# Patient Record
Sex: Male | Born: 1940 | Race: Black or African American | Hispanic: No | Marital: Married | Smoking: Former smoker
Health system: Southern US, Community
[De-identification: ages and names within clinical notes are randomized; demographics above are authoritative.]

## PROBLEM LIST (undated history)

## (undated) DIAGNOSIS — M5412 Radiculopathy, cervical region: Secondary | ICD-10-CM

## (undated) DIAGNOSIS — I700 Atherosclerosis of aorta: Secondary | ICD-10-CM

## (undated) DIAGNOSIS — M545 Low back pain, unspecified: Secondary | ICD-10-CM

## (undated) DIAGNOSIS — J45909 Unspecified asthma, uncomplicated: Secondary | ICD-10-CM

## (undated) DIAGNOSIS — E7800 Pure hypercholesterolemia, unspecified: Secondary | ICD-10-CM

## (undated) DIAGNOSIS — I10 Essential (primary) hypertension: Secondary | ICD-10-CM

## (undated) HISTORY — PX: EYE SURGERY: SHX253

## (undated) HISTORY — DX: Radiculopathy, cervical region: M54.12

## (undated) HISTORY — DX: Low back pain, unspecified: M54.50

## (undated) HISTORY — PX: CATARACT EXTRACTION: SUR2

## (undated) HISTORY — DX: Low back pain: M54.5

---

## 2006-08-25 LAB — HM COLONOSCOPY: HM COLON: ABNORMAL — AB

## 2006-08-26 ENCOUNTER — Ambulatory Visit: Payer: Self-pay | Admitting: Gastroenterology

## 2006-10-27 ENCOUNTER — Ambulatory Visit: Payer: Self-pay | Admitting: Family Medicine

## 2009-03-22 DIAGNOSIS — N400 Enlarged prostate without lower urinary tract symptoms: Secondary | ICD-10-CM | POA: Insufficient documentation

## 2011-06-29 ENCOUNTER — Emergency Department: Payer: Self-pay | Admitting: Emergency Medicine

## 2011-06-29 LAB — URINALYSIS, COMPLETE
Bacteria: NEGATIVE
Bilirubin,UR: NEGATIVE
Blood: NEGATIVE
Glucose,UR: NEGATIVE mg/dL (ref 0–75)
Ketone: NEGATIVE
Ph: 6 (ref 4.5–8.0)
Protein: 100
Specific Gravity: 1.02 (ref 1.003–1.030)
WBC UR: NONE SEEN /HPF (ref 0–5)

## 2011-06-29 LAB — CBC
HCT: 43.2 % (ref 40.0–52.0)
MCH: 30.2 pg (ref 26.0–34.0)
MCHC: 33.1 g/dL (ref 32.0–36.0)
Platelet: 193 10*3/uL (ref 150–440)
RBC: 4.74 10*6/uL (ref 4.40–5.90)
RDW: 11.9 % (ref 11.5–14.5)

## 2011-06-29 LAB — BASIC METABOLIC PANEL
Anion Gap: 7 (ref 7–16)
BUN: 15 mg/dL (ref 7–18)
Chloride: 107 mmol/L (ref 98–107)
Co2: 28 mmol/L (ref 21–32)
Glucose: 83 mg/dL (ref 65–99)
Osmolality: 283 (ref 275–301)
Potassium: 3.7 mmol/L (ref 3.5–5.1)

## 2011-07-26 LAB — PSA: PSA: NORMAL

## 2012-03-04 ENCOUNTER — Ambulatory Visit: Payer: Self-pay | Admitting: Ophthalmology

## 2012-03-10 ENCOUNTER — Ambulatory Visit: Payer: Self-pay | Admitting: Ophthalmology

## 2012-04-01 ENCOUNTER — Emergency Department: Payer: Self-pay | Admitting: Emergency Medicine

## 2012-04-01 LAB — COMPREHENSIVE METABOLIC PANEL
Albumin: 4 g/dL (ref 3.4–5.0)
Alkaline Phosphatase: 85 U/L (ref 50–136)
Anion Gap: 8 (ref 7–16)
BUN: 21 mg/dL — ABNORMAL HIGH (ref 7–18)
Bilirubin,Total: 0.8 mg/dL (ref 0.2–1.0)
Calcium, Total: 8.9 mg/dL (ref 8.5–10.1)
Chloride: 105 mmol/L (ref 98–107)
Co2: 26 mmol/L (ref 21–32)
Creatinine: 0.89 mg/dL (ref 0.60–1.30)
EGFR (African American): >60
EGFR (Non-African Amer.): >60
Glucose: 140 mg/dL — ABNORMAL HIGH (ref 65–99)
Osmolality: 283 (ref 275–301)
Potassium: 3.3 mmol/L — ABNORMAL LOW (ref 3.5–5.1)
SGOT(AST): 27 U/L (ref 15–37)
SGPT (ALT): 23 U/L (ref 12–78)

## 2012-04-01 LAB — URINALYSIS, COMPLETE
Bacteria: NONE SEEN
Bilirubin,UR: NEGATIVE
Glucose,UR: NEGATIVE mg/dL (ref 0–75)
Leukocyte Esterase: NEGATIVE
Nitrite: NEGATIVE
Ph: 5 (ref 4.5–8.0)
Protein: 100

## 2012-04-01 LAB — CBC
HCT: 40.1 % (ref 40.0–52.0)
MCHC: 32 g/dL (ref 32.0–36.0)
Platelet: 142 10*3/uL — ABNORMAL LOW (ref 150–440)
RBC: 4.41 10*6/uL (ref 4.40–5.90)
RDW: 11.9 % (ref 11.5–14.5)

## 2012-04-20 LAB — TSH: TSH: 1.05 u[IU]/mL (ref 0.41–5.90)

## 2012-04-20 LAB — LIPID PANEL
CHOLESTEROL: 149 mg/dL (ref 0–200)
HDL: 70 mg/dL (ref 35–70)
LDL Cholesterol: 68 mg/dL
TRIGLYCERIDES: 53 mg/dL (ref 40–160)

## 2013-01-12 ENCOUNTER — Ambulatory Visit: Payer: Self-pay | Admitting: Ophthalmology

## 2013-11-15 ENCOUNTER — Ambulatory Visit: Payer: Self-pay | Admitting: Family Medicine

## 2014-04-03 ENCOUNTER — Ambulatory Visit: Payer: Self-pay | Admitting: Ophthalmology

## 2014-06-16 NOTE — Op Note (Signed)
PATIENT NAME:  Sean Phelps, Sean Phelps MR#:  [id number] DATE OF BIRTH:  18-Aug-1940  DATE OF PROCEDURE:  03/10/2012  PREOPERATIVE DIAGNOSES: 1. Vitreomacular traction.  2. Retinal edema.  3. Epiretinal membrane.   POSTOPERATIVE DIAGNOSES: 1. Vitreomacular traction.  2. Retinal edema.   3. Epiretinal membrane.  4. Retinal tear x 2.  PRIMARY SURGEON: Cline Cools, MD   PROCEDURES PERFORMED: 1. Pars plana vitrectomy of the right eye.  2. Internal limiting membrane peel of the right eye.  3. Endolaser of the right eye.  4. Gas exchange of the right eye.   ANESTHESIA: Retrobulbar block of the right eye with monitored anesthesia care.   COMPLICATIONS: None.   INDICATIONS FOR PROCEDURE: This patient presented to my office with slowly decreasing vision in the right eye. Examination revealed vitreomacular traction and associated epiretinal membrane. There was subretinal fluid and retinal edema. The risks, benefits, and alternatives of the above procedure were discussed, and the patient wished to proceed.   DETAILS OF PROCEDURE: After informed consent was obtained, the patient was brought into the operative suite at [facility]. The patient was placed in the supine position and was given a small dose of Alfenta, and a retrobulbar block was performed on the right eye by the primary surgeon without any complications. The right eye was prepped and draped in sterile manner. After a lid speculum was inserted, a 25-gauge trocar was placed inferotemporally through displaced conjunctiva 4 mm beyond the limbus in an oblique fashion. An infusion cannula was turned on and inserted through the trocar and secured in position with Steri-Strips. Two more trocars were placed in a similar fashion superotemporally and superonasally. The vitreous cutter and light pipe were introduced through the eye, and a core vitrectomy was performed. Posterior vitreous face was engaged with suction and  elevated off of the retina. Care was taken during the course of the suction and peel given the adhesion apparent from the vitreomacular traction. Peripheral vitreous was trimmed for 360 degrees. During this time, it was noted that there were two retinal tears at approximately 11:30 and 12:30. At the end of the peripheral vitrectomy, endolaser was introduced and these were walled off. Indocyanine green was then injected to the posterior pole. This was removed within 30 seconds, and an internal limiting membrane peel was performed for 360 degrees around the fovea. Epiretinal membrane was removed during the course of the peel. A scleral-depressed exam was performed for 360 degrees. The retinal tears up above were noted to have associated subretinal fluid. No other tears could be identified for 360 degrees. An air-fluid exchange was performed, and endolaser was used to complete walling off the area of subretinal areas of subretinal fluid and tears; 10% C3F8 was used as an air-gas exchange at the conclusion of the air-fluid exchange. C3F8 was chosen secondary to inability to open the SF6 gas. Once done, the trocars were removed. The wounds were noted to be airtight, and pressure in the eye was confirmed to be approximately 10 to 15 mmHg. Dexamethasone, 5 mg, was given into the inferior fornix and the lid speculum was removed. The eye was cleaned. Cosopt and TobraDex were placed on the eye, and the patch and shield was placed over the eye. The patient was taken to postanesthesia care with instructions to remain head up.   ____________________________ Ignacia Felling. Champ Mungo, MD mfa:cb D: 03/10/2012 09:32:22 ET T: 03/10/2012 11:07:36 ET JOB#: [id number]  cc: Ignacia Felling. Wilena Tyndall, MD, <Dictator> Cline Cools MD ELECTRONICALLY  SIGNED 03/17/2012 9:59

## 2014-07-20 ENCOUNTER — Encounter: Payer: Self-pay | Admitting: Emergency Medicine

## 2014-07-20 ENCOUNTER — Emergency Department
Admission: EM | Admit: 2014-07-20 | Discharge: 2014-07-20 | Disposition: A | Discharge status: Home / Self Care | Payer: Commercial Managed Care - HMO | Attending: Emergency Medicine | Admitting: Emergency Medicine

## 2014-07-20 DIAGNOSIS — Y9389 Activity, other specified: Secondary | ICD-10-CM | POA: Diagnosis not present

## 2014-07-20 DIAGNOSIS — Z041 Encounter for examination and observation following transport accident: Secondary | ICD-10-CM | POA: Insufficient documentation

## 2014-07-20 DIAGNOSIS — I10 Essential (primary) hypertension: Secondary | ICD-10-CM | POA: Diagnosis not present

## 2014-07-20 DIAGNOSIS — Y92410 Unspecified street and highway as the place of occurrence of the external cause: Secondary | ICD-10-CM | POA: Insufficient documentation

## 2014-07-20 DIAGNOSIS — E7800 Pure hypercholesterolemia, unspecified: Secondary | ICD-10-CM

## 2014-07-20 DIAGNOSIS — M79605 Pain in left leg: Secondary | ICD-10-CM

## 2014-07-20 DIAGNOSIS — Y998 Other external cause status: Secondary | ICD-10-CM | POA: Diagnosis not present

## 2014-07-20 HISTORY — DX: Pure hypercholesterolemia, unspecified: E78.00

## 2014-07-20 HISTORY — DX: Unspecified asthma, uncomplicated: J45.909

## 2014-07-20 HISTORY — DX: Essential (primary) hypertension: I10
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
## 2014-08-31 ENCOUNTER — Telehealth: Payer: Self-pay | Admitting: Family Medicine

## 2014-08-31 NOTE — Telephone Encounter (Signed)
Patient needs a referral to [facility]. Dr Elvera LennoxBrayson. Patient has an appt for Monday 09/04/2014

## 2014-09-04 NOTE — Telephone Encounter (Signed)
[employer] referral placed in acuity for Dr. Lockie Molahadwick Brasington  ICD-10: H40.IIX3 Primary Angle Glaucoma, Severe  Approved auth # [id number] Start - 09/04/2014 Expires - 03/03/2015

## 2014-09-05 ENCOUNTER — Encounter: Payer: Self-pay | Admitting: Family Medicine

## 2014-09-05 ENCOUNTER — Ambulatory Visit (INDEPENDENT_AMBULATORY_CARE_PROVIDER_SITE_OTHER): Payer: Commercial Managed Care - HMO | Admitting: Family Medicine

## 2014-09-05 VITALS — BP 120/64 | HR 68 | Temp 97.8°F | Resp 16 | Ht 61.0 in | Wt 121.6 lb

## 2014-09-05 DIAGNOSIS — F329 Major depressive disorder, single episode, unspecified: Secondary | ICD-10-CM | POA: Diagnosis not present

## 2014-09-05 DIAGNOSIS — F0390 Unspecified dementia without behavioral disturbance: Secondary | ICD-10-CM

## 2014-09-05 DIAGNOSIS — Z0000 Encounter for general adult medical examination without abnormal findings: Secondary | ICD-10-CM | POA: Diagnosis not present

## 2014-09-05 DIAGNOSIS — F32A Depression, unspecified: Secondary | ICD-10-CM

## 2014-09-05 DIAGNOSIS — F0153 Vascular dementia, unspecified severity, with mood disturbance: Secondary | ICD-10-CM | POA: Insufficient documentation

## 2014-09-05 DIAGNOSIS — F0150 Vascular dementia without behavioral disturbance: Secondary | ICD-10-CM | POA: Insufficient documentation

## 2014-09-05 LAB — POC HEMOCCULT BLD/STL (OFFICE/1-CARD/DIAGNOSTIC): OCCULT BLOOD DATE: NEGATIVE

## 2014-09-05 MED ORDER — DONEPEZIL HCL 10 MG PO TBDP: 10.0000 mg | ORAL_TABLET | Freq: Every day | ORAL | Status: DC

## 2014-09-05 MED ORDER — DONEPEZIL HCL 10 MG PO TABS
10.0000 mg | ORAL_TABLET | Freq: Every day | ORAL | Status: DC
Start: 2014-09-05 — End: 2015-04-20

## 2014-09-05 NOTE — Progress Notes (Signed)
Name: Sean Phelps   MRN: [id number]    DOB: 1941-02-14   Date:09/05/2014       Progress Note  Subjective  Chief Complaint  Chief Complaint  Patient presents with  . Annual Exam    Hypertension This is a chronic problem. The current episode started more than 1 year ago. The problem is unchanged. The problem is controlled. Pertinent negatives include no blurred vision, chest pain, headaches, neck pain, orthopnea, palpitations or shortness of breath. There are no associated agents to hypertension. Risk factors for coronary artery disease include dyslipidemia, male gender and sedentary lifestyle. Past treatments include angiotensin blockers. The current treatment provides moderate improvement.    74 year old presenting for annual H&P  Dementia.  Patient has stopped taking his Aricept. He is continued on his SSRI. His wife has noted decrease in his mentation and ability to recall. There were no side effects with the Aricept.  Hyperlipidemia  Past Medical History  Diagnosis Date  . Hypertension   . Asthma   . Elevated cholesterol 07/20/14    History  Substance Use Topics  . Smoking status: Former Games developermoker  . Smokeless tobacco: Not on file  . Alcohol Use: 0.0 oz/week    0 Standard drinks or equivalent per week     Current outpatient prescriptions:  .  aspirin 81 MG tablet, Take 81 mg by mouth daily., Disp: , Rfl:  .  citalopram (CELEXA) 20 MG tablet, , Disp: , Rfl:  .  simvastatin (ZOCOR) 40 MG tablet, , Disp: , Rfl:  .  losartan (COZAAR) 50 MG tablet, , Disp: , Rfl:   No Known Allergies   Depression screen PHQ 2/9 09/05/2014  Decreased Interest 0  Down, Depressed, Hopeless 0  PHQ - 2 Score 0    Fall Risk  09/05/2014  Falls in the past year? No   Functional Status Survey: Is the patient deaf or have difficulty hearing?: No Does the patient have difficulty seeing, even when wearing glasses/contacts?: No Does the patient have difficulty concentrating, remembering, or  making decisions?: Yes Does the patient have difficulty dressing or bathing?: No Does the patient have difficulty doing errands alone such as visiting a doctor's office or shopping?: Yes  Review of Systems  Constitutional: Negative for fever, chills and weight loss.  HENT: Negative for congestion, hearing loss, sore throat and tinnitus.   Eyes: Negative for blurred vision, double vision and redness.  Respiratory: Negative for cough, hemoptysis and shortness of breath.   Cardiovascular: Negative for chest pain, palpitations, orthopnea, claudication and leg swelling.  Gastrointestinal: Negative for heartburn, nausea, vomiting, diarrhea, constipation and blood in stool.  Genitourinary: Negative for dysuria, urgency, frequency and hematuria.  Musculoskeletal: Negative for myalgias, back pain, joint pain, falls and neck pain.  Skin: Negative for itching.  Neurological: Positive for weakness. Negative for dizziness, tingling, tremors, focal weakness, seizures, loss of consciousness and headaches.  Endo/Heme/Allergies: Does not bruise/bleed easily.  Psychiatric/Behavioral: Positive for depression and memory loss. Negative for substance abuse. The patient is not nervous/anxious and does not have insomnia.      Objective  Filed Vitals:   09/05/14 1118  BP: 120/64  Pulse: 68  Temp: 97.8 F (36.6 C)  Resp: 16  Height: 5\' 1"  (1.549 m)  Weight: 121 lb 9 oz (55.14 kg)  SpO2: 96%     Physical Exam  Constitutional: He is oriented to person, place, and time and well-developed, well-nourished, and in no distress.  HENT:  Head: Normocephalic.  Eyes: EOM are  normal. Pupils are equal, round, and reactive to light.  Neck: Normal range of motion. Neck supple. No thyromegaly present.  Cardiovascular: Normal rate, regular rhythm and normal heart sounds.   No murmur heard. Pulmonary/Chest: Effort normal and breath sounds normal. No respiratory distress. He has no wheezes.  Abdominal: Soft. Bowel  sounds are normal.  Genitourinary: Rectum normal, prostate normal and penis normal. Guaiac negative stool.  Musculoskeletal: Normal range of motion. He exhibits no edema.  Lymphadenopathy:    He has no cervical adenopathy.  Neurological: He is alert and oriented to person, place, and time. No cranial nerve deficit. Gait normal. Coordination normal.  Skin: Skin is warm and dry. No rash noted.  Psychiatric: Affect and judgment normal.      Assessment & Plan  1. Annual physical exam  - POC Hemoccult Bld/Stl (1-Cd Office Dx) - POC Hemoccult Bld/Stl (3-Cd Home Screen); Future - PSA  2. Dementia, without behavioral disturbance Worsened off his Aricept - donepezil (ARICEPT) 10 MG tablet; Take 1 tablet (10 mg total) by mouth at bedtime.  Dispense: 30 tablet; Refill: 5  3. Depression Currently under treatment - citalopram (CELEXA) 20 MG tablet;

## 2014-09-05 NOTE — Patient Instructions (Signed)
Follow-up in one month to assess his restarted Aricept

## 2014-09-06 ENCOUNTER — Ambulatory Visit: Payer: Commercial Managed Care - HMO | Admitting: Family Medicine

## 2014-09-25 ENCOUNTER — Other Ambulatory Visit: Payer: Self-pay | Admitting: Family Medicine

## 2014-09-28 ENCOUNTER — Telehealth: Payer: Self-pay | Admitting: Family Medicine

## 2014-09-28 NOTE — Telephone Encounter (Signed)
PT IS NEEDING REFERRAL DR Hedda Slade AT THE [facility] IN [place]. APPT IS AUG 9TH . IS FOR A REG EYE EXAM.

## 2014-09-29 NOTE — Telephone Encounter (Signed)
ERRENOUS °

## 2014-10-02 NOTE — Telephone Encounter (Signed)
Auth # [id number]  Ethlyn Gallery obtained for [facility]  ICD-10: A54.09W1 Dr. Sherlyn Lees Start- 10/02/2014 Expires- 03/31/15

## 2014-10-09 ENCOUNTER — Telehealth: Payer: Self-pay

## 2014-10-09 NOTE — Telephone Encounter (Signed)
Requesting a Referral for patient to see Dr. Duke Salvia today, [employer] Referral obtained Auth # [id number] ICD-10: [phone] ( vitreomacular adhesion, left eye) Start- 10/09/14 Expires - 04/07/2015 6 visits

## 2014-10-31 ENCOUNTER — Other Ambulatory Visit: Payer: Self-pay | Admitting: Family Medicine

## 2015-01-08 ENCOUNTER — Telehealth: Payer: Self-pay | Admitting: Family Medicine

## 2015-01-08 MED ORDER — LOSARTAN POTASSIUM 50 MG PO TABS: 50.0000 mg | ORAL_TABLET | Freq: Every day | ORAL | Status: DC

## 2015-01-08 NOTE — Telephone Encounter (Signed)
Script sent to [employer]

## 2015-01-08 NOTE — Telephone Encounter (Signed)
Pt needs refill on Losartan to be called into [facility], also Simvastatine.

## 2015-01-23 ENCOUNTER — Other Ambulatory Visit: Payer: Self-pay | Admitting: Family Medicine

## 2015-03-15 ENCOUNTER — Other Ambulatory Visit: Payer: Self-pay | Admitting: Family Medicine

## 2015-03-16 DIAGNOSIS — H35342 Macular cyst, hole, or pseudohole, left eye: Secondary | ICD-10-CM | POA: Diagnosis not present

## 2015-04-10 DIAGNOSIS — H401133 Primary open-angle glaucoma, bilateral, severe stage: Secondary | ICD-10-CM | POA: Diagnosis not present

## 2015-04-10 DIAGNOSIS — H31091 Other chorioretinal scars, right eye: Secondary | ICD-10-CM | POA: Diagnosis not present

## 2015-04-19 ENCOUNTER — Other Ambulatory Visit: Payer: Self-pay

## 2015-04-19 MED ORDER — LOSARTAN POTASSIUM 50 MG PO TABS
50.0000 mg | ORAL_TABLET | Freq: Every day | ORAL | Status: DC
Start: 2015-04-19 — End: 2015-07-06

## 2015-04-19 MED ORDER — SIMVASTATIN 40 MG PO TABS: 40.0000 mg | ORAL_TABLET | Freq: Every day | ORAL | Status: DC

## 2015-04-20 ENCOUNTER — Ambulatory Visit: Payer: Commercial Managed Care - HMO | Admitting: Family Medicine

## 2015-04-20 ENCOUNTER — Encounter: Payer: Self-pay | Admitting: Family Medicine

## 2015-04-20 ENCOUNTER — Other Ambulatory Visit: Payer: Self-pay

## 2015-04-20 DIAGNOSIS — F0390 Unspecified dementia without behavioral disturbance: Secondary | ICD-10-CM

## 2015-04-20 MED ORDER — CITALOPRAM HYDROBROMIDE 20 MG PO TABS: 20.0000 mg | ORAL_TABLET | Freq: Every day | ORAL | Status: DC

## 2015-04-20 MED ORDER — DONEPEZIL HCL 10 MG PO TABS: 10.0000 mg | ORAL_TABLET | Freq: Every day | ORAL | Status: DC

## 2015-05-14 DIAGNOSIS — Z961 Presence of intraocular lens: Secondary | ICD-10-CM | POA: Diagnosis not present

## 2015-05-14 DIAGNOSIS — H25012 Cortical age-related cataract, left eye: Secondary | ICD-10-CM | POA: Diagnosis not present

## 2015-05-14 DIAGNOSIS — H401133 Primary open-angle glaucoma, bilateral, severe stage: Secondary | ICD-10-CM | POA: Diagnosis not present

## 2015-05-14 DIAGNOSIS — H2512 Age-related nuclear cataract, left eye: Secondary | ICD-10-CM | POA: Diagnosis not present

## 2015-06-05 ENCOUNTER — Ambulatory Visit: Payer: Commercial Managed Care - HMO | Admitting: Family Medicine

## 2015-06-27 DIAGNOSIS — Z961 Presence of intraocular lens: Secondary | ICD-10-CM | POA: Diagnosis not present

## 2015-06-27 DIAGNOSIS — H18231 Secondary corneal edema, right eye: Secondary | ICD-10-CM | POA: Diagnosis not present

## 2015-06-27 DIAGNOSIS — H35351 Cystoid macular degeneration, right eye: Secondary | ICD-10-CM | POA: Diagnosis not present

## 2015-06-27 DIAGNOSIS — H2512 Age-related nuclear cataract, left eye: Secondary | ICD-10-CM | POA: Diagnosis not present

## 2015-06-27 DIAGNOSIS — H401133 Primary open-angle glaucoma, bilateral, severe stage: Secondary | ICD-10-CM | POA: Diagnosis not present

## 2015-07-06 ENCOUNTER — Other Ambulatory Visit: Payer: Self-pay | Admitting: Family Medicine

## 2015-07-13 ENCOUNTER — Ambulatory Visit (INDEPENDENT_AMBULATORY_CARE_PROVIDER_SITE_OTHER): Payer: Commercial Managed Care - HMO | Admitting: Family Medicine

## 2015-07-13 ENCOUNTER — Encounter: Payer: Self-pay | Admitting: Family Medicine

## 2015-07-13 VITALS — BP 126/66 | HR 60 | Temp 97.8°F | Resp 12 | Ht 61.0 in | Wt 122.0 lb

## 2015-07-13 DIAGNOSIS — F339 Major depressive disorder, recurrent, unspecified: Secondary | ICD-10-CM | POA: Insufficient documentation

## 2015-07-13 DIAGNOSIS — H269 Unspecified cataract: Secondary | ICD-10-CM | POA: Insufficient documentation

## 2015-07-13 DIAGNOSIS — Z01818 Encounter for other preprocedural examination: Secondary | ICD-10-CM

## 2015-07-13 DIAGNOSIS — F320 Major depressive disorder, single episode, mild: Secondary | ICD-10-CM | POA: Diagnosis not present

## 2015-07-13 DIAGNOSIS — H548 Legal blindness, as defined in USA: Secondary | ICD-10-CM | POA: Insufficient documentation

## 2015-07-13 DIAGNOSIS — I10 Essential (primary) hypertension: Secondary | ICD-10-CM | POA: Diagnosis not present

## 2015-07-13 DIAGNOSIS — H409 Unspecified glaucoma: Secondary | ICD-10-CM | POA: Diagnosis not present

## 2015-07-13 DIAGNOSIS — E785 Hyperlipidemia, unspecified: Secondary | ICD-10-CM | POA: Diagnosis not present

## 2015-07-13 DIAGNOSIS — Z862 Personal history of diseases of the blood and blood-forming organs and certain disorders involving the immune mechanism: Secondary | ICD-10-CM

## 2015-07-13 DIAGNOSIS — R9089 Other abnormal findings on diagnostic imaging of central nervous system: Secondary | ICD-10-CM | POA: Insufficient documentation

## 2015-07-13 DIAGNOSIS — Z23 Encounter for immunization: Secondary | ICD-10-CM | POA: Diagnosis not present

## 2015-07-13 DIAGNOSIS — F0390 Unspecified dementia without behavioral disturbance: Secondary | ICD-10-CM

## 2015-07-13 DIAGNOSIS — F332 Major depressive disorder, recurrent severe without psychotic features: Secondary | ICD-10-CM | POA: Insufficient documentation

## 2015-07-13 DIAGNOSIS — D638 Anemia in other chronic diseases classified elsewhere: Secondary | ICD-10-CM | POA: Insufficient documentation

## 2015-07-13 DIAGNOSIS — R69 Illness, unspecified: Secondary | ICD-10-CM | POA: Diagnosis not present

## 2015-07-13 MED ORDER — LOSARTAN POTASSIUM 50 MG PO TABS: ORAL_TABLET | ORAL | Status: DC

## 2015-07-13 MED ORDER — SIMVASTATIN 40 MG PO TABS
ORAL_TABLET | ORAL | Status: DC
Start: 2015-07-13 — End: 2015-12-26

## 2015-07-13 MED ORDER — CITALOPRAM HYDROBROMIDE 20 MG PO TABS: ORAL_TABLET | ORAL | Status: DC

## 2015-07-13 MED ORDER — DONEPEZIL HCL 10 MG PO TABS: 10.0000 mg | ORAL_TABLET | Freq: Every day | ORAL | Status: DC

## 2015-07-13 NOTE — Progress Notes (Signed)
Name: Sean Phelps   MRN: [id number]    DOB: 1940-02-26   Date:07/13/2015       Progress Note  Subjective  Chief Complaint  Chief Complaint  Patient presents with  . Medical Clearance    patient is [place]  08/09/15.     HPI  HTN: he is taking medication daily for bp, and denies side effects, no chest pain or palpitation.   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 They did not send a request for pre-op, we contact the office and they said they do not have forms to be filled out prior to surgery. Per wife he is having glaucoma repair on the left eye. He denies decrease in exercise tolerance, no previous history of heart attack or stroke , he takes aspirin daily and may stop using aspirin prior to procedure if required by surgeon. Continue other medication up to the morning of surgery  Dementia: he is forgetful, misplaced things inside the house, no hallucinations or behavior changes. He has been on Aricept since 2015. Brain MRI showed microvascular disease   Patient Active Problem List   Diagnosis Date Noted  . 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 Hypertension, benign 07/13/2015  . Hyperlipidemia 07/13/2015  . Cataract 07/13/2015  . Glaucoma 07/13/2015  . Abnormal brain MRI 07/13/2015  . Dementia 09/05/2014  . Benign enlargement of prostate 03/22/2009    Past Surgical History  Procedure Laterality Date  . Cataract extraction Right   . Eye surgery      Family History  Problem Relation Age of Onset  . Leukemia Father   . Emphysema Father   . Alcohol abuse Mother     Social History   Social History  . Marital Status: Married    Spouse Name: N/A  . Number of Children: N/A  . Years of Education: N/A   Occupational History  . Not on file.   Social History Main Topics  . Smoking status: Former  Games developermoker  . Smokeless tobacco: Not on file  . Alcohol Use: 0.0 oz/week    0 Standard drinks or equivalent per week  . Drug Use: No  . Sexual Activity: No   Other Topics Concern  . Not on file   Social History Narrative     Current outpatient prescriptions:  .  brimonidine (ALPHAGAN) 0.2 % ophthalmic solution, Apply to eye., Disp: , Rfl:  .  dorzolamide (TRUSOPT) 2 % ophthalmic solution, , Disp: , Rfl:  .  latanoprost (XALATAN) 0.005 % ophthalmic solution, Apply to eye., Disp: , Rfl:  .  ofloxacin (OCUFLOX) 0.3 % ophthalmic solution, , Disp: , Rfl:  .  timolol (TIMOPTIC) 0.5 % ophthalmic solution, , Disp: , Rfl:  .  aspirin 81 MG tablet, Take 81 mg by mouth daily., Disp: , Rfl:  .  citalopram (CELEXA) 20 MG tablet, TAKE 1 TABLET(20 MG) BY MOUTH DAILY, Disp: 30 tablet, Rfl: 5 .  donepezil (ARICEPT) 10 MG tablet, Take 1 tablet (10 mg total) by mouth at bedtime., Disp: 30 tablet, Rfl: 5 .  losartan (COZAAR) 50 MG tablet, TAKE 1 TABLET(50 MG) BY MOUTH DAILY, Disp: 30 tablet, Rfl: 5 .  simvastatin (ZOCOR) 40 MG tablet, TAKE 1 TABLET(40 MG) BY MOUTH AT BEDTIME, Disp: 30 tablet, Rfl: 5  Allergies  Allergen Reactions  . Brinzolamide-Brimonidine Other (See  Comments)    Red and itchy     ROS  Constitutional: Negative for fever or weight change.  Respiratory: Negative for cough and shortness of breath.   Cardiovascular: Negative for chest pain or palpitations.  Gastrointestinal: Negative for abdominal pain, no bowel changes.  Musculoskeletal: Negative for gait problem or joint swelling.  Skin: Negative for rash.  Neurological: Negative for dizziness or headache.  No other specific complaints in a complete review of systems (except as listed in HPI above).  Objective  Filed Vitals:   07/13/15 1042  BP: 126/66  Pulse: 60  Temp: 97.8 F (36.6 C)  TempSrc: Oral  Resp: 12  Height:  (1.549 m)  Weight: 122 lb (55.339 kg)  SpO2: 97%    Body mass index is 23.06  kg/(m^2).  Physical Exam  Constitutional: Patient appears well-developed and well-nourished. No distress.  HEENT: head atraumatic, normocephalic, conjunctiva injected and left eye is draining clear fluid, neck supple, throat within normal limits Cardiovascular: Normal rate, regular rhythm and normal heart sounds.  No murmur heard. No BLE edema. Pulmonary/Chest: Effort normal and breath sounds normal. No respiratory distress. Abdominal: Soft.  There is no tenderness. Psychiatric: Patient has a normal mood and affect, cooperative, oriented in time and place, also knows the president of [place]  PHQ2/9: Depression screen [facility] 2/9 07/13/2015 09/05/2014  Decreased Interest 0 0  Down, Depressed, Hopeless 0 0  PHQ - 2 Score 0 0    Fall Risk: Fall Risk  07/13/2015 09/05/2014  Falls in the past year? No No    Functional Status Survey: Is the patient deaf or have difficulty hearing?: No Does the patient have difficulty seeing, even when wearing glasses/contacts?: Yes Does the patient have difficulty concentrating, remembering, or making decisions?: No Does the patient have difficulty walking or climbing stairs?: No Does the patient have difficulty dressing or bathing?: No Does the patient have difficulty doing errands alone such as visiting a doctor's office or shopping?: No   Assessment & Plan  1. Hypertension, benign  - Comprehensive metabolic panel - losartan (COZAAR) 50 MG tablet; TAKE 1 TABLET(50 MG) BY MOUTH DAILY  Dispense: 30 tablet; Refill: 5  2. Mild major depression (HCC)  - citalopram (CELEXA) 20 MG tablet; TAKE 1 TABLET(20 MG) BY MOUTH DAILY  Dispense: 30 tablet; Refill: 5  3. Hyperlipidemia  - Lipid panel - simvastatin (ZOCOR) 40 MG tablet; TAKE 1 TABLET(40 MG) BY MOUTH AT BEDTIME  Dispense: 30 tablet; Refill: 5  4. Glaucoma   5. Cataract   6. Dementia, without behavioral disturbance  - donepezil (ARICEPT) 10 MG tablet; Take 1 tablet (10 mg total) by mouth at  bedtime.  Dispense: 30 tablet; Refill: 5  7. History of anemia  - CBC with Differential/Platelet  8. Need for Tdap vaccination  - Tdap vaccine greater than or equal to 7yo IM  9. Need for pneumococcal vaccination  - Pneumococcal polysaccharide vaccine 23-valent greater than or equal to 2yo subcutaneous/IM  10. Need for shingles vaccine  - Varicella-zoster vaccine subcutaneous -refused  11. Pre-op exam  -Tdap Able to perform at least 4 METS of activity, no cardiac symptoms, low risk procedure, may have surgery without further testing

## 2015-07-20 DIAGNOSIS — I10 Essential (primary) hypertension: Secondary | ICD-10-CM | POA: Diagnosis not present

## 2015-07-20 DIAGNOSIS — D649 Anemia, unspecified: Secondary | ICD-10-CM | POA: Diagnosis not present

## 2015-07-20 DIAGNOSIS — R739 Hyperglycemia, unspecified: Secondary | ICD-10-CM | POA: Diagnosis not present

## 2015-07-20 DIAGNOSIS — Z862 Personal history of diseases of the blood and blood-forming organs and certain disorders involving the immune mechanism: Secondary | ICD-10-CM | POA: Diagnosis not present

## 2015-07-20 DIAGNOSIS — E785 Hyperlipidemia, unspecified: Secondary | ICD-10-CM | POA: Diagnosis not present

## 2015-07-21 LAB — CBC WITH DIFFERENTIAL/PLATELET
BASOS: 1 %
Basophils Absolute: 0 10*3/uL (ref 0.0–0.2)
EOS (ABSOLUTE): 0.1 10*3/uL (ref 0.0–0.4)
EOS: 1 %
HEMATOCRIT: 36.8 % — AB (ref 37.5–51.0)
HEMOGLOBIN: 11.9 g/dL — AB (ref 12.6–17.7)
IMMATURE GRANS (ABS): 0 10*3/uL (ref 0.0–0.1)
Immature Granulocytes: 0 %
LYMPHS: 38 %
Lymphocytes Absolute: 1.6 10*3/uL (ref 0.7–3.1)
MCH: 29.9 pg (ref 26.6–33.0)
MCHC: 32.3 g/dL (ref 31.5–35.7)
MCV: 93 fL (ref 79–97)
Monocytes Absolute: 0.4 10*3/uL (ref 0.1–0.9)
Monocytes: 9 %
NEUTROS ABS: 2.1 10*3/uL (ref 1.4–7.0)
Neutrophils: 51 %
PLATELETS: 172 10*3/uL (ref 150–379)
RBC: 3.98 x10E6/uL — ABNORMAL LOW (ref 4.14–5.80)
RDW: 11.9 % — ABNORMAL LOW (ref 12.3–15.4)
WBC: 4.1 10*3/uL (ref 3.4–10.8)

## 2015-07-21 LAB — LIPID PANEL
CHOL/HDL RATIO: 1.8 ratio (ref 0.0–5.0)
CHOLESTEROL TOTAL: 151 mg/dL (ref 100–199)
HDL: 83 mg/dL (ref >39)
LDL Calculated: 60 mg/dL (ref 0–99)
TRIGLYCERIDES: 41 mg/dL (ref 0–149)
VLDL Cholesterol Cal: 8 mg/dL (ref 5–40)

## 2015-07-21 LAB — COMPREHENSIVE METABOLIC PANEL
A/G RATIO: 1.4 (ref 1.2–2.2)
ALT: 10 IU/L (ref 0–44)
AST: 21 IU/L (ref 0–40)
Albumin: 3.8 g/dL (ref 3.5–4.8)
Alkaline Phosphatase: 68 IU/L (ref 39–117)
BUN/Creatinine Ratio: 22 (ref 10–24)
BUN: 18 mg/dL (ref 8–27)
Bilirubin Total: 0.5 mg/dL (ref 0.0–1.2)
CALCIUM: 9.2 mg/dL (ref 8.6–10.2)
CO2: 23 mmol/L (ref 18–29)
Chloride: 105 mmol/L (ref 96–106)
Creatinine, Ser: 0.82 mg/dL (ref 0.76–1.27)
GFR calc Af Amer: 99 mL/min/{1.73_m2} (ref >59)
GFR calc non Af Amer: 85 mL/min/{1.73_m2} (ref >59)
GLOBULIN, TOTAL: 2.7 g/dL (ref 1.5–4.5)
Glucose: 107 mg/dL — ABNORMAL HIGH (ref 65–99)
POTASSIUM: 3.9 mmol/L (ref 3.5–5.2)
Sodium: 144 mmol/L (ref 134–144)
TOTAL PROTEIN: 6.5 g/dL (ref 6.0–8.5)

## 2015-07-23 ENCOUNTER — Other Ambulatory Visit: Payer: Self-pay | Admitting: Family Medicine

## 2015-07-23 DIAGNOSIS — R739 Hyperglycemia, unspecified: Secondary | ICD-10-CM

## 2015-07-23 DIAGNOSIS — D649 Anemia, unspecified: Secondary | ICD-10-CM

## 2015-07-25 LAB — FERRITIN: FERRITIN: 209 ng/mL (ref 30–400)

## 2015-07-25 LAB — SPECIMEN STATUS REPORT

## 2015-07-25 LAB — HEMOGLOBIN A1C
ESTIMATED AVERAGE GLUCOSE: 103 mg/dL
Hgb A1c MFr Bld: 5.2 % (ref 4.8–5.6)

## 2015-07-26 ENCOUNTER — Encounter: Payer: Self-pay | Admitting: Family Medicine

## 2015-08-02 ENCOUNTER — Telehealth: Payer: Self-pay | Admitting: Family Medicine

## 2015-08-02 NOTE — Telephone Encounter (Signed)
Please return wife call. Have questions about the patient summary that was given to him on the last visit

## 2015-08-06 NOTE — Telephone Encounter (Signed)
Patient wife had a question why his summary had history of anemia, I informed patient wife his blood work showed he had mild anemia.

## 2015-08-09 DIAGNOSIS — I10 Essential (primary) hypertension: Secondary | ICD-10-CM | POA: Diagnosis not present

## 2015-08-09 DIAGNOSIS — H4010X3 Unspecified open-angle glaucoma, severe stage: Secondary | ICD-10-CM | POA: Diagnosis not present

## 2015-08-09 DIAGNOSIS — Z87891 Personal history of nicotine dependence: Secondary | ICD-10-CM | POA: Diagnosis not present

## 2015-08-09 DIAGNOSIS — R69 Illness, unspecified: Secondary | ICD-10-CM | POA: Diagnosis not present

## 2015-08-09 DIAGNOSIS — E785 Hyperlipidemia, unspecified: Secondary | ICD-10-CM | POA: Diagnosis not present

## 2015-08-09 DIAGNOSIS — H401123 Primary open-angle glaucoma, left eye, severe stage: Secondary | ICD-10-CM | POA: Diagnosis not present

## 2015-08-09 DIAGNOSIS — Z79899 Other long term (current) drug therapy: Secondary | ICD-10-CM | POA: Diagnosis not present

## 2015-08-10 DIAGNOSIS — H401113 Primary open-angle glaucoma, right eye, severe stage: Secondary | ICD-10-CM | POA: Insufficient documentation

## 2015-08-10 DIAGNOSIS — Z9889 Other specified postprocedural states: Secondary | ICD-10-CM | POA: Insufficient documentation

## 2015-10-08 DIAGNOSIS — Z9889 Other specified postprocedural states: Secondary | ICD-10-CM | POA: Diagnosis not present

## 2015-10-08 DIAGNOSIS — H401133 Primary open-angle glaucoma, bilateral, severe stage: Secondary | ICD-10-CM | POA: Diagnosis not present

## 2015-12-26 ENCOUNTER — Ambulatory Visit (INDEPENDENT_AMBULATORY_CARE_PROVIDER_SITE_OTHER): Payer: Medicare HMO | Admitting: Family Medicine

## 2015-12-26 ENCOUNTER — Encounter: Payer: Self-pay | Admitting: Family Medicine

## 2015-12-26 VITALS — BP 134/86 | HR 55 | Temp 97.9°F | Resp 16 | Ht 61.0 in | Wt 121.6 lb

## 2015-12-26 DIAGNOSIS — Z23 Encounter for immunization: Secondary | ICD-10-CM

## 2015-12-26 DIAGNOSIS — F329 Major depressive disorder, single episode, unspecified: Secondary | ICD-10-CM | POA: Diagnosis not present

## 2015-12-26 DIAGNOSIS — I10 Essential (primary) hypertension: Secondary | ICD-10-CM

## 2015-12-26 DIAGNOSIS — H548 Legal blindness, as defined in USA: Secondary | ICD-10-CM

## 2015-12-26 DIAGNOSIS — E7800 Pure hypercholesterolemia, unspecified: Secondary | ICD-10-CM

## 2015-12-26 DIAGNOSIS — F0153 Vascular dementia, unspecified severity, with mood disturbance: Secondary | ICD-10-CM

## 2015-12-26 DIAGNOSIS — D638 Anemia in other chronic diseases classified elsewhere: Secondary | ICD-10-CM | POA: Diagnosis not present

## 2015-12-26 DIAGNOSIS — F332 Major depressive disorder, recurrent severe without psychotic features: Secondary | ICD-10-CM

## 2015-12-26 DIAGNOSIS — F0150 Vascular dementia without behavioral disturbance: Secondary | ICD-10-CM

## 2015-12-26 DIAGNOSIS — R69 Illness, unspecified: Secondary | ICD-10-CM | POA: Diagnosis not present

## 2015-12-26 MED ORDER — SIMVASTATIN 40 MG PO TABS: ORAL_TABLET | ORAL | 5 refills | Status: DC

## 2015-12-26 MED ORDER — DONEPEZIL HCL 10 MG PO TABS: 10.0000 mg | ORAL_TABLET | Freq: Every day | ORAL | 5 refills | Status: DC

## 2015-12-26 MED ORDER — LOSARTAN POTASSIUM 50 MG PO TABS: ORAL_TABLET | ORAL | 5 refills | Status: DC

## 2015-12-26 MED ORDER — CITALOPRAM HYDROBROMIDE 20 MG PO TABS: ORAL_TABLET | ORAL | 5 refills | Status: DC

## 2015-12-26 NOTE — Progress Notes (Signed)
Name: Sean PaceCharles H Bohman   MRN: [id number]    DOB: 1940-04-19   Date:12/26/2015       Progress Note  Subjective  Chief Complaint  Chief Complaint  Patient presents with  . Medication Refill    6 month F/U  . Memory Loss    Patient wife states his memory is getting worst even with medication. Forgetting short term things and asking the same question over repeatedly.   . Hypertension    Patient states he has headaches but might be related to stress  . Hyperlipidemia  . Depression    Unchanged.     HPI    HTN: he is taking medication daily for bp, and denies side effects, no chest pain or palpitation. BP is at goal .  Hyperlipidemia: taking simvastatin, denies myalgia, reviewed labs, and it is at goal   Dementia: he is forgetful, misplaced things inside the house, no hallucinations or behavior changes. He has been on Aricept since 2015. Brain MRI showed microvascular disease.   Depression: he has a long history of depression, but worse since this Summer, legally blind of right eye and vision has decreased,  He has been unable to work since August 2017. Gets bored at home. Not getting out of the house. He has suicidal thoughts, he denies suicidal planning. He states he would not due that because of his children.   Anemia of Chronic diease: we will monitor for now, normal ferritin   Patient Active Problem List   Diagnosis Date Noted  . Primary open angle glaucoma of both eyes, severe stage 08/10/2015  . Status post eye surgery 08/10/2015  . 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 Hypertension, benign 07/13/2015  . Hyperlipidemia 07/13/2015  . Cataract 07/13/2015  . Abnormal brain MRI 07/13/2015  . Legally blind in right eye, as defined in [place] 07/13/2015  . Anemia of chronic disease 07/13/2015  . Dementia, vascular, with depression 09/05/2014  . Benign enlargement of prostate 03/22/2009    Past Surgical History:  Procedure Laterality Date  . CATARACT EXTRACTION Right   .  EYE SURGERY      Family History  Problem Relation Age of Onset  . Leukemia Father   . Emphysema Father   . Alcohol abuse Mother     Social History   Social History  . Marital status: Married    Spouse name: N/A  . Number of children: N/A  . Years of education: N/A   Occupational History  . Not on file.   Social History Main Topics  . Smoking status: Former Smoker    Years: 25.00    Types: Cigarettes    Quit date: 12/25/1965  . Smokeless tobacco: Never Used  . Alcohol use No  . Drug use: No  . Sexual activity: No   Other Topics Concern  . Not on file   Social History Narrative  . No narrative on file     Current Outpatient Prescriptions:  .  aspirin 81 MG tablet, Take 81 mg by mouth daily., Disp: , Rfl:  .  citalopram (CELEXA) 20 MG tablet, TAKE 1 TABLET(20 MG) BY MOUTH DAILY, Disp: 30 tablet, Rfl: 5 .  donepezil (ARICEPT) 10 MG tablet, Take 1 tablet (10 mg total) by mouth at bedtime., Disp: 30 tablet, Rfl: 5 .  losartan (COZAAR) 50 MG tablet, TAKE 1 TABLET(50 MG) BY MOUTH DAILY, Disp: 30 tablet, Rfl: 5 .  simvastatin (ZOCOR) 40 MG tablet, TAKE 1 TABLET(40 MG) BY MOUTH AT BEDTIME, Disp:  30 tablet, Rfl: 5 .  timolol (TIMOPTIC) 0.5 % ophthalmic solution, , Disp: , Rfl:   Allergies  Allergen Reactions  . Brinzolamide-Brimonidine Other (See Comments)    Red and itchy     ROS  Constitutional: Negative for fever or weight change.  Respiratory: Negative for cough and shortness of breath.   Cardiovascular: Negative for chest pain or palpitations.  Gastrointestinal: Negative for abdominal pain, no bowel changes.  Musculoskeletal: Negative for gait problem or joint swelling.  Skin: Negative for rash.  Neurological: Negative for dizziness or headache.  No other specific complaints in a complete review of systems (except as listed in HPI above).  Objective  Vitals:   12/26/15 1114  BP: 134/86  Pulse: (!) 55  Resp: 16  Temp: 97.9 F (36.6 C)  TempSrc: Oral   SpO2: 97%  Weight: 121 lb 9.6 oz (55.2 kg)  Height: 5\' 1"  (1.549 m)    Body mass index is 22.98 kg/m.  Physical Exam  Constitutional: Patient appears well-developed and well-nourished.  No distress.  HEENT: head atraumatic, normocephalic, pupils equal and reactive to light,  neck supple, throat within normal limits Cardiovascular: Normal rate, regular rhythm and normal heart sounds.  No murmur heard. No BLE edema. Pulmonary/Chest: Effort normal and breath sounds normal. No respiratory distress. Abdominal: Soft.  There is no tenderness. Psychiatric: Patient has a depressed  mood and affect. behavior is normal. Judgment and thought content normal.  PHQ2/9: Depression screen [facility] 2/9 12/26/2015 12/26/2015 07/13/2015 09/05/2014  Decreased Interest 1 0 0 0  Down, Depressed, Hopeless 2 0 0 0  PHQ - 2 Score 3 0 0 0  Altered sleeping 3 - - -  Tired, decreased energy 0 - - -  Change in appetite 0 - - -  Feeling bad or failure about yourself  3 - - -  Trouble concentrating 3 - - -  Moving slowly or fidgety/restless 2 - - -  Suicidal thoughts 2 - - -  PHQ-9 Score 16 - - -  Difficult doing work/chores Extremely dIfficult - - -    Fall Risk: Fall Risk  12/26/2015 07/13/2015 09/05/2014  Falls in the past year? Yes No No  Number falls in past yr: 2 or more - -  Injury with Fall? No - -  Risk for fall due to : Impaired vision - -  Follow up Falls prevention discussed - -     Functional Status Survey: Is the patient deaf or have difficulty hearing?: No Does the patient have difficulty seeing, even when wearing glasses/contacts?: No Does the patient have difficulty concentrating, remembering, or making decisions?: Yes Does the patient have difficulty walking or climbing stairs?: No Does the patient have difficulty dressing or bathing?: No Does the patient have difficulty doing errands alone such as visiting a doctor's office or shopping?: Yes    Assessment & Plan  1. Hypertension,  benign  - losartan (COZAAR) 50 MG tablet; TAKE 1 TABLET(50 MG) BY MOUTH DAILY  Dispense: 30 tablet; Refill: 5  2. Severe episode of recurrent major depressive disorder, without psychotic features (HCC)  - citalopram (CELEXA) 20 MG tablet; TAKE 1 TABLET(20 MG) BY MOUTH DAILY  Dispense: 30 tablet; Refill: 5 - Ambulatory referral to Psychology  3. Pure hypercholesterolemia  - simvastatin (ZOCOR) 40 MG tablet; TAKE 1 TABLET(40 MG) BY MOUTH AT BEDTIME  Dispense: 30 tablet; Refill: 5  4. Legally blind in right eye, as defined in [place]  Continue follow up ophthalmologist   5. Anemia  of chronic disease  Recheck next visit  6. Dementia, vascular, with depression  - donepezil (ARICEPT) 10 MG tablet; Take 1 tablet (10 mg total) by mouth at bedtime.  Dispense: 30 tablet; Refill: 5

## 2015-12-26 NOTE — Addendum Note (Signed)
Addended by: Cynda FamiliaJOHNSON, Jadriel Saxer L on: 12/26/2015 11:58 AM   Modules accepted: Orders

## 2016-01-03 ENCOUNTER — Other Ambulatory Visit: Payer: Self-pay

## 2016-01-03 DIAGNOSIS — F332 Major depressive disorder, recurrent severe without psychotic features: Secondary | ICD-10-CM

## 2016-01-03 NOTE — Telephone Encounter (Signed)
Patient requesting 90 day refill of Citalopram due to cost.

## 2016-01-05 MED ORDER — CITALOPRAM HYDROBROMIDE 20 MG PO TABS: ORAL_TABLET | ORAL | 0 refills | Status: DC

## 2016-01-09 DIAGNOSIS — H401133 Primary open-angle glaucoma, bilateral, severe stage: Secondary | ICD-10-CM | POA: Diagnosis not present

## 2016-02-11 ENCOUNTER — Ambulatory Visit: Payer: Medicare HMO | Admitting: Family Medicine

## 2016-02-11 DIAGNOSIS — H401133 Primary open-angle glaucoma, bilateral, severe stage: Secondary | ICD-10-CM | POA: Diagnosis not present

## 2016-02-11 DIAGNOSIS — H2512 Age-related nuclear cataract, left eye: Secondary | ICD-10-CM | POA: Diagnosis not present

## 2016-02-11 DIAGNOSIS — H35353 Cystoid macular degeneration, bilateral: Secondary | ICD-10-CM | POA: Diagnosis not present

## 2016-02-11 DIAGNOSIS — H21542 Posterior synechiae (iris), left eye: Secondary | ICD-10-CM | POA: Diagnosis not present

## 2016-03-05 DIAGNOSIS — H3581 Retinal edema: Secondary | ICD-10-CM | POA: Diagnosis not present

## 2016-03-05 DIAGNOSIS — H35371 Puckering of macula, right eye: Secondary | ICD-10-CM | POA: Diagnosis not present

## 2016-03-05 DIAGNOSIS — H35342 Macular cyst, hole, or pseudohole, left eye: Secondary | ICD-10-CM | POA: Diagnosis not present

## 2016-03-05 DIAGNOSIS — H21542 Posterior synechiae (iris), left eye: Secondary | ICD-10-CM | POA: Diagnosis not present

## 2016-03-07 ENCOUNTER — Ambulatory Visit: Payer: Medicare HMO | Admitting: Family Medicine

## 2016-03-11 ENCOUNTER — Ambulatory Visit: Payer: Medicare HMO | Admitting: Family Medicine

## 2016-03-19 ENCOUNTER — Encounter: Payer: Self-pay | Admitting: Family Medicine

## 2016-03-19 ENCOUNTER — Ambulatory Visit (INDEPENDENT_AMBULATORY_CARE_PROVIDER_SITE_OTHER): Payer: Medicare HMO | Admitting: Family Medicine

## 2016-03-19 VITALS — BP 124/70 | HR 60 | Temp 97.7°F | Resp 16 | Ht 61.0 in | Wt 121.3 lb

## 2016-03-19 DIAGNOSIS — H2512 Age-related nuclear cataract, left eye: Secondary | ICD-10-CM

## 2016-03-19 DIAGNOSIS — H548 Legal blindness, as defined in USA: Secondary | ICD-10-CM | POA: Diagnosis not present

## 2016-03-19 DIAGNOSIS — Z01818 Encounter for other preprocedural examination: Secondary | ICD-10-CM

## 2016-03-19 DIAGNOSIS — F332 Major depressive disorder, recurrent severe without psychotic features: Secondary | ICD-10-CM | POA: Diagnosis not present

## 2016-03-19 DIAGNOSIS — I10 Essential (primary) hypertension: Secondary | ICD-10-CM | POA: Diagnosis not present

## 2016-03-19 DIAGNOSIS — H35353 Cystoid macular degeneration, bilateral: Secondary | ICD-10-CM

## 2016-03-19 DIAGNOSIS — R69 Illness, unspecified: Secondary | ICD-10-CM | POA: Diagnosis not present

## 2016-03-19 DIAGNOSIS — D649 Anemia, unspecified: Secondary | ICD-10-CM

## 2016-03-19 LAB — CBC WITH DIFFERENTIAL/PLATELET
BASOS ABS: 0 {cells}/uL (ref 0–200)
Basophils Relative: 0 %
EOS PCT: 2 %
Eosinophils Absolute: 98 cells/uL (ref 15–500)
HEMATOCRIT: 41.1 % (ref 38.5–50.0)
HEMOGLOBIN: 13.2 g/dL (ref 13.2–17.1)
LYMPHS ABS: 1813 {cells}/uL (ref 850–3900)
LYMPHS PCT: 37 %
MCH: 30 pg (ref 27.0–33.0)
MCHC: 32.1 g/dL (ref 32.0–36.0)
MCV: 93.4 fL (ref 80.0–100.0)
MPV: 10.3 fL (ref 7.5–12.5)
Monocytes Absolute: 441 cells/uL (ref 200–950)
Monocytes Relative: 9 %
Neutro Abs: 2548 cells/uL (ref 1500–7800)
Neutrophils Relative %: 52 %
Platelets: 166 10*3/uL (ref 140–400)
RBC: 4.4 MIL/uL (ref 4.20–5.80)
RDW: 11.8 % (ref 11.0–15.0)
WBC: 4.9 10*3/uL (ref 3.8–10.8)

## 2016-03-19 MED ORDER — CITALOPRAM HYDROBROMIDE 20 MG PO TABS: ORAL_TABLET | ORAL | 1 refills | Status: DC

## 2016-03-19 NOTE — Progress Notes (Signed)
Name: Sean PaceCharles H Lavin   MRN: [id number]    DOB: 1940-10-07   Date:03/19/2016       Progress Note  Subjective  Chief Complaint  Chief Complaint  Patient presents with  . surgical clearance    [place] [facility] [place] [place] Dr. Loraine Grip[place]oya    HPI  Pre-op; he will have cataract surgery and Dr. Loraine GripMoya has requested pre-op clearance. He denies chest pain or decrease in exercise tolerance. Able to go up a flight of stairs without SOB. Had eye surgery last year without any complications. He denies tobacco use, no false teeth, he has chronic mild anemia, he is compliant with bp medication and bp has been at goal  Depression: PHQ9 is slightly better, he is taking Citalopram and denies side effects, discussed counseling but he wants to hold off , he states he has been coping better. Accepting the fact that he cannot work    Patient Active Problem List   Diagnosis Date Noted  . Primary open angle glaucoma of both eyes, severe stage 08/10/2015  . Status post eye surgery 08/10/2015  . Recurrent major depression-severe (HCC) 07/13/2015  . Hypertension, benign 07/13/2015  . Hyperlipidemia 07/13/2015  . Cataract 07/13/2015  . Abnormal brain MRI 07/13/2015  . Legally blind in right eye, as defined in [place] 07/13/2015  . Anemia of chronic disease 07/13/2015  . Dementia, vascular, with depression 09/05/2014  . Benign enlargement of prostate 03/22/2009    Past Surgical History:  Procedure Laterality Date  . CATARACT EXTRACTION Right   . EYE SURGERY      Family History  Problem Relation Age of Onset  . Leukemia Father   . Emphysema Father   . Alcohol abuse Mother     Social History   Social History  . Marital status: Married    Spouse name: N/A  . Number of children: N/A  . Years of education: N/A   Occupational History  . Not on file.   Social History Main Topics  . Smoking status: Former Smoker    Years: 25.00    Types: Cigarettes    Quit date: 12/25/1965  . Smokeless  tobacco: Never Used  . Alcohol use No  . Drug use: No  . Sexual activity: No   Other Topics Concern  . Not on file   Social History Narrative  . No narrative on file     Current Outpatient Prescriptions:  .  ketorolac (ACULAR) 0.4 % SOLN, Insert one drop 4 times a day left eye, Disp: , Rfl:  .  ofloxacin (OCUFLOX) 0.3 % ophthalmic solution, Insert one drop 3 times a day into surgical eye starting 4 days prior to surgery, Disp: , Rfl:  .  prednisoLONE acetate (PRED FORTE) 1 % ophthalmic suspension, Apply to eye., Disp: , Rfl:  .  aspirin 81 MG tablet, Take 81 mg by mouth daily., Disp: , Rfl:  .  citalopram (CELEXA) 20 MG tablet, TAKE 1 TABLET(20 MG) BY MOUTH DAILY, Disp: 90 tablet, Rfl: 1 .  donepezil (ARICEPT) 10 MG tablet, Take 1 tablet (10 mg total) by mouth at bedtime., Disp: 30 tablet, Rfl: 5 .  losartan (COZAAR) 50 MG tablet, TAKE 1 TABLET(50 MG) BY MOUTH DAILY, Disp: 30 tablet, Rfl: 5 .  simvastatin (ZOCOR) 40 MG tablet, TAKE 1 TABLET(40 MG) BY MOUTH AT BEDTIME, Disp: 30 tablet, Rfl: 5 .  timolol (TIMOPTIC) 0.5 % ophthalmic solution, , Disp: , Rfl:   Allergies  Allergen Reactions  . Brinzolamide-Brimonidine Other (  See Comments)    Red and itchy     ROS  Constitutional: Negative for fever or weight change.  Respiratory: Negative for cough and shortness of breath.   Cardiovascular: Negative for chest pain or palpitations.  Gastrointestinal: Negative for abdominal pain, no bowel changes.  Musculoskeletal: Negative for gait problem or joint swelling.  Skin: Negative for rash.  Neurological: Negative for dizziness or headache.  No other specific complaints in a complete review of systems (except as listed in HPI above).  Objective  Vitals:   03/19/16 1007  BP: 124/70  Pulse: 60  Resp: 16  Temp: 97.7 F (36.5 C)  SpO2: 96%  Weight: 121 lb 5 oz (55 kg)  Height: 5\' 1"  (1.549 m)    Body mass index is 22.92 kg/m.  Physical Exam  Constitutional: Patient appears  well-developed and well-nourished. No distress.  HEENT: head atraumatic, normocephalic, right  conjunctiva is injected, mild ptosis of right eye, neck supple, throat within normal limits Cardiovascular: Normal rate, regular rhythm and normal heart sounds.  No murmur heard. No BLE edema. Pulmonary/Chest: Effort normal and breath sounds normal. No respiratory distress. Abdominal: Soft.  There is no tenderness. Psychiatric: Patient has a depressed mood and blunt effect.   PHQ2/9: Depression screen [facility] 2/9 03/19/2016 12/26/2015 12/26/2015 07/13/2015 09/05/2014  Decreased Interest 3 1 0 0 0  Down, Depressed, Hopeless 2 2 0 0 0  PHQ - 2 Score 5 3 0 0 0  Altered sleeping 3 3 - - -  Tired, decreased energy 0 0 - - -  Change in appetite 0 0 - - -  Feeling bad or failure about yourself  1 3 - - -  Trouble concentrating 0 3 - - -  Moving slowly or fidgety/restless 2 2 - - -  Suicidal thoughts 0 2 - - -  PHQ-9 Score 11 16 - - -  Difficult doing work/chores Very difficult Extremely dIfficult - - -     Fall Risk: Fall Risk  12/26/2015 07/13/2015 09/05/2014  Falls in the past year? Yes No No  Number falls in past yr: 2 or more - -  Injury with Fall? No - -  Risk for fall due to : Impaired vision - -  Follow up Falls prevention discussed - -      Functional Status Survey: Is the patient deaf or have difficulty hearing?: No Does the patient have difficulty seeing, even when wearing glasses/contacts?: Yes Does the patient have difficulty concentrating, remembering, or making decisions?: No Does the patient have difficulty walking or climbing stairs?: Yes Does the patient have difficulty dressing or bathing?: No Does the patient have difficulty doing errands alone such as visiting a doctor's office or shopping?: Yes    Assessment & Plan  1. Hypertension, benign  Continue medication  2. Legally blind in right eye, as defined in [place]  Keep follow up with Dr. Loraine Grip  3. Mild chronic  anemia  -CBC  4. Cystoid macular edema, bilateral  Keep follow up with Dr. Loraine Grip  5. Nuclear sclerotic cataract of left eye  May proceed to surgery without further testing  6. Severe episode of recurrent major depressive disorder, without psychotic features (HCC)  Continue Citalopram daily , refill sent to pharmacy today   7. Pre-op evaluation  Low risk surgery, able to perform at least 8 METS of physical activity, no further test needed at this time. Okay to hold aspirin prior to surgery

## 2016-03-28 DIAGNOSIS — H2512 Age-related nuclear cataract, left eye: Secondary | ICD-10-CM | POA: Diagnosis not present

## 2016-04-08 DIAGNOSIS — Z7982 Long term (current) use of aspirin: Secondary | ICD-10-CM | POA: Diagnosis not present

## 2016-04-08 DIAGNOSIS — Z87891 Personal history of nicotine dependence: Secondary | ICD-10-CM | POA: Diagnosis not present

## 2016-04-08 DIAGNOSIS — I10 Essential (primary) hypertension: Secondary | ICD-10-CM | POA: Diagnosis not present

## 2016-04-08 DIAGNOSIS — H2512 Age-related nuclear cataract, left eye: Secondary | ICD-10-CM | POA: Diagnosis not present

## 2016-04-08 DIAGNOSIS — H21542 Posterior synechiae (iris), left eye: Secondary | ICD-10-CM | POA: Diagnosis not present

## 2016-04-08 DIAGNOSIS — H5703 Miosis: Secondary | ICD-10-CM | POA: Diagnosis not present

## 2016-04-08 DIAGNOSIS — Z888 Allergy status to other drugs, medicaments and biological substances status: Secondary | ICD-10-CM | POA: Diagnosis not present

## 2016-04-08 DIAGNOSIS — E785 Hyperlipidemia, unspecified: Secondary | ICD-10-CM | POA: Diagnosis not present

## 2016-04-08 DIAGNOSIS — H409 Unspecified glaucoma: Secondary | ICD-10-CM | POA: Diagnosis not present

## 2016-04-08 DIAGNOSIS — Z79899 Other long term (current) drug therapy: Secondary | ICD-10-CM | POA: Diagnosis not present

## 2016-04-08 DIAGNOSIS — R69 Illness, unspecified: Secondary | ICD-10-CM | POA: Diagnosis not present

## 2016-04-09 DIAGNOSIS — Z9842 Cataract extraction status, left eye: Secondary | ICD-10-CM

## 2016-04-09 DIAGNOSIS — Z961 Presence of intraocular lens: Secondary | ICD-10-CM | POA: Insufficient documentation

## 2016-04-21 DIAGNOSIS — Z961 Presence of intraocular lens: Secondary | ICD-10-CM | POA: Diagnosis not present

## 2016-04-21 DIAGNOSIS — Z9842 Cataract extraction status, left eye: Secondary | ICD-10-CM | POA: Diagnosis not present

## 2016-04-21 DIAGNOSIS — H578 Other specified disorders of eye and adnexa: Secondary | ICD-10-CM | POA: Diagnosis not present

## 2016-05-06 ENCOUNTER — Other Ambulatory Visit: Payer: Self-pay | Admitting: Family Medicine

## 2016-05-06 DIAGNOSIS — F329 Major depressive disorder, single episode, unspecified: Principal | ICD-10-CM

## 2016-05-06 DIAGNOSIS — F0153 Vascular dementia, unspecified severity, with mood disturbance: Secondary | ICD-10-CM

## 2016-05-06 DIAGNOSIS — F0150 Vascular dementia without behavioral disturbance: Secondary | ICD-10-CM

## 2016-05-06 NOTE — Telephone Encounter (Signed)
Patient requesting refill of Aricept to [facility].

## 2016-05-07 NOTE — Telephone Encounter (Signed)
Pt informed and will call back to schedule appt °

## 2016-05-19 DIAGNOSIS — H1820 Unspecified corneal edema: Secondary | ICD-10-CM | POA: Insufficient documentation

## 2016-05-23 ENCOUNTER — Encounter: Payer: Self-pay | Admitting: Family Medicine

## 2016-06-16 DIAGNOSIS — G3184 Mild cognitive impairment, so stated: Secondary | ICD-10-CM | POA: Diagnosis not present

## 2016-06-16 DIAGNOSIS — M13111 Monoarthritis, not elsewhere classified, right shoulder: Secondary | ICD-10-CM | POA: Diagnosis not present

## 2016-06-16 DIAGNOSIS — M13112 Monoarthritis, not elsewhere classified, left shoulder: Secondary | ICD-10-CM | POA: Diagnosis not present

## 2016-06-16 DIAGNOSIS — Z6822 Body mass index (BMI) 22.0-22.9, adult: Secondary | ICD-10-CM | POA: Diagnosis not present

## 2016-06-16 DIAGNOSIS — I10 Essential (primary) hypertension: Secondary | ICD-10-CM | POA: Diagnosis not present

## 2016-06-16 DIAGNOSIS — Z79899 Other long term (current) drug therapy: Secondary | ICD-10-CM | POA: Diagnosis not present

## 2016-06-16 DIAGNOSIS — Z87891 Personal history of nicotine dependence: Secondary | ICD-10-CM | POA: Diagnosis not present

## 2016-06-16 DIAGNOSIS — R69 Illness, unspecified: Secondary | ICD-10-CM | POA: Diagnosis not present

## 2016-06-16 DIAGNOSIS — E7800 Pure hypercholesterolemia, unspecified: Secondary | ICD-10-CM | POA: Diagnosis not present

## 2016-06-16 DIAGNOSIS — Z0000 Encounter for general adult medical examination without abnormal findings: Secondary | ICD-10-CM | POA: Diagnosis not present

## 2016-07-01 DIAGNOSIS — H4440 Unspecified hypotony of eye: Secondary | ICD-10-CM | POA: Diagnosis not present

## 2016-07-01 DIAGNOSIS — H401113 Primary open-angle glaucoma, right eye, severe stage: Secondary | ICD-10-CM | POA: Diagnosis not present

## 2016-07-01 DIAGNOSIS — H1820 Unspecified corneal edema: Secondary | ICD-10-CM | POA: Diagnosis not present

## 2016-07-01 DIAGNOSIS — H18221 Idiopathic corneal edema, right eye: Secondary | ICD-10-CM | POA: Diagnosis not present

## 2016-07-01 DIAGNOSIS — H18231 Secondary corneal edema, right eye: Secondary | ICD-10-CM | POA: Diagnosis not present

## 2016-07-01 DIAGNOSIS — I10 Essential (primary) hypertension: Secondary | ICD-10-CM | POA: Diagnosis not present

## 2016-07-01 DIAGNOSIS — Z888 Allergy status to other drugs, medicaments and biological substances status: Secondary | ICD-10-CM | POA: Diagnosis not present

## 2016-07-01 DIAGNOSIS — Z7982 Long term (current) use of aspirin: Secondary | ICD-10-CM | POA: Diagnosis not present

## 2016-07-01 DIAGNOSIS — E785 Hyperlipidemia, unspecified: Secondary | ICD-10-CM | POA: Diagnosis not present

## 2016-07-01 DIAGNOSIS — H401133 Primary open-angle glaucoma, bilateral, severe stage: Secondary | ICD-10-CM | POA: Diagnosis not present

## 2016-07-01 DIAGNOSIS — H44431 Hypotony of eye due to other ocular disorders, right eye: Secondary | ICD-10-CM | POA: Diagnosis not present

## 2016-07-01 DIAGNOSIS — R69 Illness, unspecified: Secondary | ICD-10-CM | POA: Diagnosis not present

## 2016-07-01 DIAGNOSIS — Z87891 Personal history of nicotine dependence: Secondary | ICD-10-CM | POA: Diagnosis not present

## 2016-07-16 DIAGNOSIS — H18231 Secondary corneal edema, right eye: Secondary | ICD-10-CM | POA: Diagnosis not present

## 2016-07-20 ENCOUNTER — Emergency Department: Payer: Medicare HMO

## 2016-07-20 ENCOUNTER — Emergency Department
Admission: EM | Admit: 2016-07-20 | Discharge: 2016-07-20 | Disposition: A | Discharge status: Home / Self Care | Payer: Medicare HMO | Attending: Emergency Medicine | Admitting: Emergency Medicine

## 2016-07-20 ENCOUNTER — Encounter: Payer: Self-pay | Admitting: Family Medicine

## 2016-07-20 DIAGNOSIS — I700 Atherosclerosis of aorta: Secondary | ICD-10-CM

## 2016-07-20 DIAGNOSIS — Z7982 Long term (current) use of aspirin: Secondary | ICD-10-CM | POA: Insufficient documentation

## 2016-07-20 DIAGNOSIS — Z79899 Other long term (current) drug therapy: Secondary | ICD-10-CM | POA: Insufficient documentation

## 2016-07-20 DIAGNOSIS — W01198A Fall on same level from slipping, tripping and stumbling with subsequent striking against other object, initial encounter: Secondary | ICD-10-CM | POA: Insufficient documentation

## 2016-07-20 DIAGNOSIS — Y939 Activity, unspecified: Secondary | ICD-10-CM | POA: Insufficient documentation

## 2016-07-20 DIAGNOSIS — S299XXA Unspecified injury of thorax, initial encounter: Secondary | ICD-10-CM | POA: Diagnosis not present

## 2016-07-20 DIAGNOSIS — J45909 Unspecified asthma, uncomplicated: Secondary | ICD-10-CM | POA: Insufficient documentation

## 2016-07-20 DIAGNOSIS — Z043 Encounter for examination and observation following other accident: Secondary | ICD-10-CM | POA: Insufficient documentation

## 2016-07-20 DIAGNOSIS — Y999 Unspecified external cause status: Secondary | ICD-10-CM | POA: Diagnosis not present

## 2016-07-20 DIAGNOSIS — R69 Illness, unspecified: Secondary | ICD-10-CM | POA: Diagnosis not present

## 2016-07-20 DIAGNOSIS — S0990XA Unspecified injury of head, initial encounter: Secondary | ICD-10-CM | POA: Diagnosis not present

## 2016-07-20 DIAGNOSIS — R9431 Abnormal electrocardiogram [ECG] [EKG]: Secondary | ICD-10-CM | POA: Diagnosis not present

## 2016-07-20 DIAGNOSIS — Z87891 Personal history of nicotine dependence: Secondary | ICD-10-CM | POA: Insufficient documentation

## 2016-07-20 DIAGNOSIS — F0150 Vascular dementia without behavioral disturbance: Secondary | ICD-10-CM | POA: Diagnosis not present

## 2016-07-20 DIAGNOSIS — I10 Essential (primary) hypertension: Secondary | ICD-10-CM | POA: Insufficient documentation

## 2016-07-20 DIAGNOSIS — W19XXXA Unspecified fall, initial encounter: Secondary | ICD-10-CM

## 2016-07-20 DIAGNOSIS — Y929 Unspecified place or not applicable: Secondary | ICD-10-CM | POA: Insufficient documentation

## 2016-07-20 HISTORY — DX: Atherosclerosis of aorta: I70.0

## 2016-07-20 LAB — COMPREHENSIVE METABOLIC PANEL
ALT: 11 U/L — AB (ref 17–63)
AST: 22 U/L (ref 15–41)
Albumin: 3.6 g/dL (ref 3.5–5.0)
Alkaline Phosphatase: 60 U/L (ref 38–126)
Anion gap: 8 (ref 5–15)
BILIRUBIN TOTAL: 1 mg/dL (ref 0.3–1.2)
BUN: 13 mg/dL (ref 6–20)
CHLORIDE: 104 mmol/L (ref 101–111)
CO2: 27 mmol/L (ref 22–32)
CREATININE: 0.84 mg/dL (ref 0.61–1.24)
Calcium: 9 mg/dL (ref 8.9–10.3)
GFR calc Af Amer: >60 mL/min (ref >60)
GLUCOSE: 89 mg/dL (ref 65–99)
Potassium: 3.7 mmol/L (ref 3.5–5.1)
Sodium: 139 mmol/L (ref 135–145)
Total Protein: 7.1 g/dL (ref 6.5–8.1)

## 2016-07-20 LAB — CBC WITH DIFFERENTIAL/PLATELET
BASOS ABS: 0 10*3/uL (ref 0–0.1)
Basophils Relative: 1 %
Eosinophils Absolute: 0.1 10*3/uL (ref 0–0.7)
Eosinophils Relative: 2 %
HCT: 36.8 % — ABNORMAL LOW (ref 40.0–52.0)
Hemoglobin: 12.4 g/dL — ABNORMAL LOW (ref 13.0–18.0)
LYMPHS PCT: 28 %
Lymphs Abs: 1.1 10*3/uL (ref 1.0–3.6)
MCH: 30.8 pg (ref 26.0–34.0)
MCHC: 33.8 g/dL (ref 32.0–36.0)
MCV: 91.1 fL (ref 80.0–100.0)
MONO ABS: 0.4 10*3/uL (ref 0.2–1.0)
MONOS PCT: 10 %
Neutro Abs: 2.3 10*3/uL (ref 1.4–6.5)
Neutrophils Relative %: 59 %
PLATELETS: 145 10*3/uL — AB (ref 150–440)
RBC: 4.04 MIL/uL — ABNORMAL LOW (ref 4.40–5.90)
RDW: 11.9 % (ref 11.5–14.5)
WBC: 3.9 10*3/uL (ref 3.8–10.6)

## 2016-07-20 LAB — URINALYSIS, COMPLETE (UACMP) WITH MICROSCOPIC
BACTERIA UA: NONE SEEN
Bilirubin Urine: NEGATIVE
Glucose, UA: NEGATIVE mg/dL
Hgb urine dipstick: NEGATIVE
Ketones, ur: NEGATIVE mg/dL
LEUKOCYTES UA: NEGATIVE
Nitrite: NEGATIVE
PH: 6 (ref 5.0–8.0)
Protein, ur: 30 mg/dL — AB
SPECIFIC GRAVITY, URINE: 1.019 (ref 1.005–1.030)
SQUAMOUS EPITHELIAL / LPF: NONE SEEN

## 2016-07-20 LAB — TROPONIN I

## 2016-07-20 NOTE — ED Notes (Signed)
FIRST NURSE NOTE: Family states the patient fell a week ago and hit his on the cement, pt fell again last night.  Family states patient has c/o headache for the past several days. Pt ambulatory in the lobby without difficulty.  Placed in wheelchair.

## 2016-07-20 NOTE — ED Notes (Signed)
Pt taken to CT by CT tech

## 2016-07-20 NOTE — Discharge Instructions (Signed)
Please be careful when you stand. Stand up slowly make sure you hold onto something. If you been laying down in bed and sit up in bed first for a few minutes and then swivel your legs over the side of the bed and sit that way for a few minutes then stand up holding onto something. Be very careful, hold onto the railing going down the stairs, make sure there is plenty of light so you don't trip over anything. Return for any further problems. Please follow-up with your doctor on Tuesday.

## 2016-07-20 NOTE — ED Provider Notes (Signed)
[facility] Emergency Department Provider Note   ____________________________________________   First MD Initiated Contact with Patient 07/20/16 1113     (approximate)  I have reviewed the triage vital signs and the nursing notes.   HISTORY  Chief Complaint Fall    HPI Sean Phelps is a 76 y.o. male . Patient's family report he fell last week and hit his head on the cement and he fell again last night. He's had a headache for the last few days. He is not having any trouble at present and denies any headache or any other pain when I see him now in the emergency room. Patient used to see Dr. Charlette Caffey who has now retired he is seeing the male doctor who is now on that practice. He has a follow-up appointment next week. Patient has a history of glaucoma in his left eye is blind in the right eye. He has takes antihypertensive medications. Patient had no difficulty ambulating in the lobby. Past Medical History:  Diagnosis Date  . Asthma   . Brachial neuritis   . Brachial neuritis   . Elevated cholesterol 07/20/14  . Hypertension   . Lumbago     Patient Active Problem List   Diagnosis Date Noted  . Primary open angle glaucoma of both eyes, severe stage 08/10/2015  . Status post eye surgery 08/10/2015  . Recurrent major depression-severe (HCC) 07/13/2015  . Hypertension, benign 07/13/2015  . Hyperlipidemia 07/13/2015  . Cataract 07/13/2015  . Abnormal brain MRI 07/13/2015  . Legally blind in right eye, as defined in [place] 07/13/2015  . Anemia of chronic disease 07/13/2015  . Dementia, vascular, with depression 09/05/2014  . Benign enlargement of prostate 03/22/2009    Past Surgical History:  Procedure Laterality Date  . CATARACT EXTRACTION Right   . EYE SURGERY      Prior to Admission medications   Medication Sig Start Date End Date Taking? Authorizing Provider  aspirin 81 MG tablet Take 81 mg by mouth daily.   Yes [provider]    atropine 1 % ophthalmic solution Place 1 drop into the right eye 2 (two) times daily. 07/02/16  Yes [provider]  citalopram (CELEXA) 20 MG tablet TAKE 1 TABLET(20 MG) BY MOUTH DAILY 03/19/16  Yes Sowles, Danna Hefty, MD  donepezil (ARICEPT) 10 MG tablet TAKE 1 TABLET BY MOUTH AT BEDTIME 05/06/16  Yes Sowles, Danna Hefty, MD  losartan (COZAAR) 50 MG tablet TAKE 1 TABLET(50 MG) BY MOUTH DAILY 12/26/15  Yes Sowles, Danna Hefty, MD  moxifloxacin (VIGAMOX) 0.5 % ophthalmic solution Place 1 drop into the right eye 3 (three) times daily. 07/02/16 07/23/16 Yes [provider]  prednisoLONE acetate (PRED FORTE) 1 % ophthalmic suspension Place 1 drop into the right eye 4 (four) times daily.  03/05/16  Yes [provider]  simvastatin (ZOCOR) 40 MG tablet TAKE 1 TABLET(40 MG) BY MOUTH AT BEDTIME 12/26/15  Yes Sowles, Danna Hefty, MD  timolol (TIMOPTIC) 0.5 % ophthalmic solution Place 1 drop into the left eye 2 (two) times daily.  05/03/15  Yes [provider]    Allergies Brinzolamide-brimonidine  Family History  Problem Relation Age of Onset  . Leukemia Father   . Emphysema Father   . Alcohol abuse Mother     Social History Social History  Substance Use Topics  . Smoking status: Former Smoker    Years: 25.00    Types: Cigarettes    Quit date: 12/25/1965  . Smokeless tobacco: Never Used  .  Alcohol use No    Review of Systems  Constitutional: No fever/chills Eyes: No Acute visual changes. ENT: No sore throat. Cardiovascular: Denies chest pain. Respiratory: Denies shortness of breath. Gastrointestinal: No abdominal pain.  No nausea, no vomiting.  No diarrhea.  No constipation. Genitourinary: Negative for dysuria. Musculoskeletal: Negative for back pain. Skin: Negative for rash. Neurological: Negative for headaches, focal weakness or numbness.   ____________________________________________   PHYSICAL EXAM:  VITAL SIGNS: ED Triage Vitals [07/20/16 1109]  Enc Vitals  Group     BP (!) 145/66     Pulse Rate (!) 53     Resp 18     Temp 97.7 F (36.5 C)     Temp Source Oral     SpO2 98 %     Weight 127 lb (57.6 kg)     Height 5\' 2"  (1.575 m)     Head Circumference      Peak Flow      Pain Score 0     Pain Loc      Pain Edu?      Excl. in GC?     Constitutional: Alert and oriented. Well appearing and in no acute distress. Eyes: Right conjunctiva is cloudy. Eyes deviated out. Left eye looks normal but has a lot of arcus senilis. Extraocular movements intact the left eye reacts to light Head: Atraumatic. Nose: No congestion/rhinnorhea. Mouth/Throat: Mucous membranes are moist.  Oropharynx non-erythematous. Neck: No stridor.  No cervical spine tenderness to palpation. Cardiovascular: Normal rate, regular rhythm. Grossly normal heart sounds.  Good peripheral circulation. Respiratory: Normal respiratory effort.  No retractions. Lungs CTAB. Gastrointestinal: Soft and nontender. No distention. No abdominal bruits. No CVA tenderness. Musculoskeletal: No lower extremity tenderness nor edema.  No joint effusions. Neurologic:  Normal speech and language. No gross focal neurologic deficits are appreciated. Cranial nerves II through XII are intact although visual fields were not checked in the course the right eye is chronically blind. Cerebellar finger-nose rapid alternating movements and hands and heel-to-shin are all normal. Motor strength is 5 over 5 throughout and sensation is intact. No gait instability. Skin:  Skin is warm, dry and intact. No rash noted. Psychiatric: Mood and affect are normal. Speech and behavior are normal.  ____________________________________________   LABS (all labs ordered are listed, but only abnormal results are displayed)  Labs Reviewed  COMPREHENSIVE METABOLIC PANEL - Abnormal; Notable for the following:       Result Value   ALT 11 (*)    All other components within normal limits  CBC WITH DIFFERENTIAL/PLATELET -  Abnormal; Notable for the following:    RBC 4.04 (*)    Hemoglobin 12.4 (*)    HCT 36.8 (*)    Platelets 145 (*)    All other components within normal limits  TROPONIN I  URINALYSIS, COMPLETE (UACMP) WITH MICROSCOPIC   ____________________________________________  EKG  EKG read and interpreted by me shows sinus bradycardia rate of 54 normal axis no acute ST-T wave changes computer is reading consider left atrial enlargement ____________________________________________  RADIOLOGY IMPRESSION: 1. No acute intracranial abnormality. 2. Chronic small vessel ischemic change and brain atrophy.   Electronically Signed   By: Signa Kell M.D.   On: 07/20/2016 11:34 IMPRESSION: No acute cardiopulmonary abnormality seen.  Aortic atherosclerosis.   Electronically Signed   By: Lupita Raider, M.D.   On: 07/20/2016 11:39 ____________________________________________   PROCEDURES  Procedure(s) performed:  Procedures  Critical Care performed:  ____________________________________________   INITIAL IMPRESSION /  ASSESSMENT AND PLAN / ED COURSE  Pertinent labs & imaging results that were available during my care of the patient were reviewed by me and considered in my medical decision making (see chart for details).        ____________________________________________   FINAL CLINICAL IMPRESSION(S) / ED DIAGNOSES  Final diagnoses:  Fall, initial encounter      NEW MEDICATIONS STARTED DURING THIS VISIT:  New Prescriptions   No medications on file     Note:  This document was prepared using Dragon voice recognition software and may include unintentional dictation errors.    Arnaldo NatalMalinda, Olsen Mccutchan F, MD 07/20/16 1324

## 2016-08-06 ENCOUNTER — Ambulatory Visit (INDEPENDENT_AMBULATORY_CARE_PROVIDER_SITE_OTHER): Payer: Medicare HMO | Admitting: Family Medicine

## 2016-08-06 ENCOUNTER — Encounter: Payer: Self-pay | Admitting: Family Medicine

## 2016-08-06 VITALS — BP 132/78 | HR 60 | Temp 97.7°F | Resp 16 | Ht 62.0 in | Wt 118.5 lb

## 2016-08-06 DIAGNOSIS — I10 Essential (primary) hypertension: Secondary | ICD-10-CM

## 2016-08-06 DIAGNOSIS — R4189 Other symptoms and signs involving cognitive functions and awareness: Secondary | ICD-10-CM | POA: Diagnosis not present

## 2016-08-06 DIAGNOSIS — F329 Major depressive disorder, single episode, unspecified: Secondary | ICD-10-CM

## 2016-08-06 DIAGNOSIS — E7800 Pure hypercholesterolemia, unspecified: Secondary | ICD-10-CM

## 2016-08-06 DIAGNOSIS — G309 Alzheimer's disease, unspecified: Secondary | ICD-10-CM | POA: Diagnosis not present

## 2016-08-06 DIAGNOSIS — F0150 Vascular dementia without behavioral disturbance: Secondary | ICD-10-CM

## 2016-08-06 DIAGNOSIS — D649 Anemia, unspecified: Secondary | ICD-10-CM | POA: Diagnosis not present

## 2016-08-06 DIAGNOSIS — R69 Illness, unspecified: Secondary | ICD-10-CM | POA: Diagnosis not present

## 2016-08-06 DIAGNOSIS — H548 Legal blindness, as defined in USA: Secondary | ICD-10-CM | POA: Diagnosis not present

## 2016-08-06 DIAGNOSIS — R739 Hyperglycemia, unspecified: Secondary | ICD-10-CM

## 2016-08-06 DIAGNOSIS — F332 Major depressive disorder, recurrent severe without psychotic features: Secondary | ICD-10-CM

## 2016-08-06 DIAGNOSIS — Z9181 History of falling: Secondary | ICD-10-CM | POA: Diagnosis not present

## 2016-08-06 DIAGNOSIS — F0153 Vascular dementia, unspecified severity, with mood disturbance: Secondary | ICD-10-CM

## 2016-08-06 LAB — LIPID PANEL
Cholesterol: 173 mg/dL (ref <200)
HDL: 90 mg/dL (ref >40)
LDL Cholesterol: 73 mg/dL (ref <100)
Total CHOL/HDL Ratio: 1.9 Ratio (ref <5.0)
Triglycerides: 50 mg/dL (ref <150)
VLDL: 10 mg/dL (ref <30)

## 2016-08-06 LAB — TSH: TSH: 1 mIU/L (ref 0.40–4.50)

## 2016-08-06 MED ORDER — CITALOPRAM HYDROBROMIDE 20 MG PO TABS
ORAL_TABLET | ORAL | 1 refills | Status: DC
Start: 2016-08-06 — End: 2016-12-31

## 2016-08-06 MED ORDER — DONEPEZIL HCL 10 MG PO TABS: 10.0000 mg | ORAL_TABLET | Freq: Every day | ORAL | 1 refills | Status: DC

## 2016-08-06 MED ORDER — MEMANTINE HCL 28 X 5 MG & 21 X 10 MG PO TABS: ORAL_TABLET | ORAL | 12 refills | Status: DC

## 2016-08-06 MED ORDER — SIMVASTATIN 40 MG PO TABS: ORAL_TABLET | ORAL | 1 refills | Status: DC

## 2016-08-06 MED ORDER — LOSARTAN POTASSIUM 50 MG PO TABS: ORAL_TABLET | ORAL | 1 refills | Status: DC

## 2016-08-06 NOTE — Progress Notes (Signed)
Name: Sean Phelps   MRN: [id number]    DOB: Oct 21, 1940   Date:08/06/2016       Progress Note  Subjective  Chief Complaint  Chief Complaint  Patient presents with  . Memory Loss    Wife states he sleeps all the time and will remember from past. But his short term memory comes and goes.     HPI   HTN: he is taking medication daily for bp, and denies side effects, no chest pain or palpitation. BP is at goal.  Hyperlipidemia: taking simvastatin, denies myalgia, reviewed labs, and it is at goal   Dementia: he is forgetful, misplaced things inside the house, no hallucinations or behavior changes, wife states his memory has declined, seems to be short term mostly, but also has long term memory loss. He has been on Aricept since 2015. Brain MRI showed microvascular disease, MMS on 06/13 down to 17. We will check labs and add Namenda  Depression: he has a long history of depression, but worse since Summer 2017 , legally blind of right eye and vision has decreased,  He has been unable to work since August 2017. Gets bored at home, wife states he sleeps all the time. Not getting out of the house. He has suicidal thoughts, he denies suicidal planning. He states he would not due that because of his children.   Anemia of Chronic diease: we will continue to monitor  Hyperglycemia: we will check hgbA1C, he denies polyphagia, he has polydipsia and polyuria    Patient Active Problem List   Diagnosis Date Noted  . Thoracic aortic atherosclerosis ([place]) 07/20/2016  . Corneal edema of right eye 05/19/2016  . Pseudophakia of right eye 04/09/2016  . Status post cataract extraction and insertion of intraocular lens of left eye 04/09/2016  . Primary open angle glaucoma of right eye, severe stage 08/10/2015  . Status post eye surgery 08/10/2015  . Recurrent major depression-severe ([place]) 07/13/2015  . Hypertension, benign 07/13/2015  . Hyperlipidemia 07/13/2015  . Cataract 07/13/2015  . Abnormal  brain MRI 07/13/2015  . Legally blind in right eye, as defined in [place] 07/13/2015  . Anemia of chronic disease 07/13/2015  . Dementia, vascular, with depression 09/05/2014  . Benign enlargement of prostate 03/22/2009    Past Surgical History:  Procedure Laterality Date  . CATARACT EXTRACTION Right   . EYE SURGERY      Family History  Problem Relation Age of Onset  . Leukemia Father   . Emphysema Father   . Alcohol abuse Mother     Social History   Social History  . Marital status: Married    Spouse name: N/A  . Number of children: N/A  . Years of education: N/A   Occupational History  . Not on file.   Social History Main Topics  . Smoking status: Former Smoker    Years: 25.00    Types: Cigarettes    Quit date: 12/25/1965  . Smokeless tobacco: Never Used  . Alcohol use No  . Drug use: No  . Sexual activity: No   Other Topics Concern  . Not on file   Social History Narrative  . No narrative on file     Current Outpatient Prescriptions:  .  aspirin 81 MG tablet, Take 81 mg by mouth daily., Disp: , Rfl:  .  atropine 1 % ophthalmic solution, Place 1 drop into the right eye 2 (two) times daily., Disp: , Rfl:  .  citalopram (CELEXA) 20 MG tablet,  TAKE 1 TABLET(20 MG) BY MOUTH DAILY, Disp: 90 tablet, Rfl: 1 .  donepezil (ARICEPT) 10 MG tablet, Take 1 tablet (10 mg total) by mouth at bedtime., Disp: 90 tablet, Rfl: 1 .  losartan (COZAAR) 50 MG tablet, TAKE 1 TABLET(50 MG) BY MOUTH DAILY, Disp: 90 tablet, Rfl: 1 .  prednisoLONE acetate (PRED FORTE) 1 % ophthalmic suspension, Place 1 drop into the right eye 4 (four) times daily. , Disp: , Rfl:  .  simvastatin (ZOCOR) 40 MG tablet, TAKE 1 TABLET(40 MG) BY MOUTH AT BEDTIME, Disp: 90 tablet, Rfl: 1 .  timolol (TIMOPTIC) 0.5 % ophthalmic solution, Place 1 drop into the left eye 2 (two) times daily. , Disp: , Rfl:  .  memantine (NAMENDA TITRATION PAK) tablet pack, 5 mg/day for =1 week; 5 mg twice daily for =1 week; 15 mg/day  given in 5 mg and 10 mg separated doses for =1 week; then 10 mg twice daily, Disp: 49 tablet, Rfl: 12  Allergies  Allergen Reactions  . Brinzolamide-Brimonidine Other (See Comments)    Red and itchy     ROS  Constitutional: Negative for fever or significant  weight change.  Respiratory: Negative for cough and shortness of breath.   Cardiovascular: Negative for chest pain or palpitations.  Gastrointestinal: Negative for abdominal pain, no bowel changes.  Musculoskeletal: Negative for gait problem or joint swelling. Stumbles secondary to poor vision Skin: Negative for rash.  Neurological: Negative for dizziness or headache.  No other specific complaints in a complete review of systems (except as listed in HPI above).  Objective  Vitals:   08/06/16 1041  BP: 132/78  Pulse: 60  Resp: 16  Temp: 97.7 F (36.5 C)  TempSrc: Oral  SpO2: 99%  Weight: 118 lb 8 oz (53.8 kg)  Height: '5\' 2"'$  (1.575 m)    Body mass index is 21.67 kg/m.  Physical Exam  Constitutional: Patient appears well-developed and well-nourished.  No distress.  HEENT: head atraumatic, normocephalic, lenses present on left eye, right pupil non-reactive,  neck supple, throat within normal limits Cardiovascular: Normal rate, regular rhythm and normal heart sounds.  No murmur heard. No BLE edema. Pulmonary/Chest: Effort normal and breath sounds normal. No respiratory distress. Abdominal: Soft.  There is no tenderness. Psychiatric: Patient has a depressed  mood and affect. behavior is normal. Judgment and thought content normal.   Recent Results (from the past 2160 hour(s))  Comprehensive metabolic panel     Status: Abnormal   Collection Time: 07/20/16 11:49 AM  Result Value Ref Range   Sodium 139 135 - 145 mmol/L   Potassium 3.7 3.5 - 5.1 mmol/L   Chloride 104 101 - 111 mmol/L   CO2 27 22 - 32 mmol/L   Glucose, Bld 89 65 - 99 mg/dL   BUN 13 6 - 20 mg/dL   Creatinine, Ser 0.84 0.61 - 1.24 mg/dL   Calcium  9.0 8.9 - 10.3 mg/dL   Total Protein 7.1 6.5 - 8.1 g/dL   Albumin 3.6 3.5 - 5.0 g/dL   AST 22 15 - 41 U/L   ALT 11 (L) 17 - 63 U/L   Alkaline Phosphatase 60 38 - 126 U/L   Total Bilirubin 1.0 0.3 - 1.2 mg/dL   GFR calc non Af Amer >60 >60 mL/min   GFR calc Af Amer >60 >60 mL/min    Comment: (NOTE) The eGFR has been calculated using the CKD EPI equation. This calculation has not been validated in all clinical situations. eGFR's persistently <  60 mL/min signify possible Chronic Kidney Disease.    Anion gap 8 5 - 15  Troponin I     Status: None   Collection Time: 07/20/16 11:49 AM  Result Value Ref Range   Troponin I <0.03 <0.03 ng/mL  CBC with Differential     Status: Abnormal   Collection Time: 07/20/16 11:49 AM  Result Value Ref Range   WBC 3.9 3.8 - 10.6 K/uL   RBC 4.04 (L) 4.40 - 5.90 MIL/uL   Hemoglobin 12.4 (L) 13.0 - 18.0 g/dL   HCT 36.8 (L) 40.0 - 52.0 %   MCV 91.1 80.0 - 100.0 fL   MCH 30.8 26.0 - 34.0 pg   MCHC 33.8 32.0 - 36.0 g/dL   RDW 11.9 11.5 - 14.5 %   Platelets 145 (L) 150 - 440 K/uL   Neutrophils Relative % 59 %   Neutro Abs 2.3 1.4 - 6.5 K/uL   Lymphocytes Relative 28 %   Lymphs Abs 1.1 1.0 - 3.6 K/uL   Monocytes Relative 10 %   Monocytes Absolute 0.4 0.2 - 1.0 K/uL   Eosinophils Relative 2 %   Eosinophils Absolute 0.1 0 - 0.7 K/uL   Basophils Relative 1 %   Basophils Absolute 0.0 0 - 0.1 K/uL  Urinalysis, Complete w Microscopic     Status: Abnormal   Collection Time: 07/20/16  1:20 PM  Result Value Ref Range   Color, Urine YELLOW (A) YELLOW   APPearance CLEAR (A) CLEAR   Specific Gravity, Urine 1.019 1.005 - 1.030   pH 6.0 5.0 - 8.0   Glucose, UA NEGATIVE NEGATIVE mg/dL   Hgb urine dipstick NEGATIVE NEGATIVE   Bilirubin Urine NEGATIVE NEGATIVE   Ketones, ur NEGATIVE NEGATIVE mg/dL   Protein, ur 30 (A) NEGATIVE mg/dL   Nitrite NEGATIVE NEGATIVE   Leukocytes, UA NEGATIVE NEGATIVE   RBC / HPF 0-5 0 - 5 RBC/hpf   WBC, UA 0-5 0 - 5 WBC/hpf    Bacteria, UA NONE SEEN NONE SEEN   Squamous Epithelial / LPF NONE SEEN NONE SEEN   Mucous PRESENT       PHQ2/9: Depression screen [facility] 2/9 08/06/2016 03/19/2016 12/26/2015 12/26/2015 07/13/2015  Decreased Interest 0 3 1 0 0  Down, Depressed, Hopeless _0 0 0  PHQ - 2 Score _1 0 0  Altered sleeping - 3 3 - -  Tired, decreased energy - 0 0 - -  Change in appetite - 0 0 - -  Feeling bad or failure about yourself  - 1 3 - -  Trouble concentrating - 0 3 - -  Moving slowly or fidgety/restless - 2 2 - -  Suicidal thoughts - 0 2 - -  PHQ-9 Score - 11 16 - -  Difficult doing work/chores - Very difficult Extremely dIfficult - -     Fall Risk: Fall Risk  08/06/2016 12/26/2015 07/13/2015 09/05/2014  Falls in the past year? Yes Yes No No  Number falls in past yr: 2 or more 2 or more - -  Injury with Fall? Yes No - -  Risk for fall due to : Impaired balance/gait Impaired vision - -  Follow up - Falls prevention discussed - -     Functional Status Survey: Is the patient deaf or have difficulty hearing?: Yes Does the patient have difficulty seeing, even when wearing glasses/contacts?: Yes Does the patient have difficulty concentrating, remembering, or making decisions?: Yes Does the patient have difficulty walking or climbing stairs?:  Yes Does the patient have difficulty dressing or bathing?: No Does the patient have difficulty doing errands alone such as visiting a doctor's office or shopping?: Yes    Assessment & Plan  1. Hypertension, benign  - losartan (COZAAR) 50 MG tablet; TAKE 1 TABLET(50 MG) BY MOUTH DAILY  Dispense: 90 tablet; Refill: 1  2. Legally blind in right eye, as defined in [place]   3. Pure hypercholesterolemia  - simvastatin (ZOCOR) 40 MG tablet; TAKE 1 TABLET(40 MG) BY MOUTH AT BEDTIME  Dispense: 90 tablet; Refill: 1 - Lipid panel  4. Dementia, vascular, with depression  Still has good long term memory, possible Alzheimer's type we will add Namenda and if no  improvement and labs normal they would like a referral to Neurologist  - donepezil (ARICEPT) 10 MG tablet; Take 1 tablet (10 mg total) by mouth at bedtime.  Dispense: 90 tablet; Refill: 1 - memantine (NAMENDA TITRATION PAK) tablet pack; 5 mg/day for =1 week; 5 mg twice daily for =1 week; 15 mg/day given in 5 mg and 10 mg separated doses for =1 week; then 10 mg twice daily  Dispense: 49 tablet; Refill: 12  5. History of recent fall  Went to [facility], secondary to poor vision   6. Severe episode of recurrent major depressive disorder, without psychotic features ([place])  - citalopram (CELEXA) 20 MG tablet; TAKE 1 TABLET(20 MG) BY MOUTH DAILY  Dispense: 90 tablet; Refill: 1  7. Mild chronic anemia  - Iron, TIBC and Ferritin Panel  8. Cognitive decline  - C-reactive protein - Vitamin B12 - TSH - RPR - Sedimentation rate  9. Hyperglycemia  - Hemoglobin A1c

## 2016-08-07 LAB — IRON,TIBC AND FERRITIN PANEL
%SAT: 28 % (ref 15–60)
FERRITIN: 192 ng/mL (ref 20–380)
IRON: 75 ug/dL (ref 50–180)
TIBC: 271 ug/dL (ref 250–425)

## 2016-08-07 LAB — SEDIMENTATION RATE: SED RATE: 22 mm/h — AB (ref 0–20)

## 2016-08-07 LAB — HEMOGLOBIN A1C
Hgb A1c MFr Bld: 4.8 % (ref <5.7)
MEAN PLASMA GLUCOSE: 91 mg/dL

## 2016-08-07 LAB — C-REACTIVE PROTEIN: CRP: 1.2 mg/L (ref <8.0)

## 2016-08-07 LAB — RPR

## 2016-08-07 LAB — VITAMIN B12: VITAMIN B 12: 470 pg/mL (ref 200–1100)

## 2016-08-11 ENCOUNTER — Telehealth: Payer: Self-pay | Admitting: Family Medicine

## 2016-08-11 ENCOUNTER — Telehealth: Payer: Self-pay

## 2016-08-11 NOTE — Telephone Encounter (Signed)
[name] (wife) states Pt was prescribed memantine and its going cost him $200 is it possible for you to call in something less expensive (this script was a tier 4).

## 2016-08-11 NOTE — Telephone Encounter (Signed)
Patient calling you back about a prescription

## 2016-08-11 NOTE — Telephone Encounter (Signed)
Spoke to his wife and asked her to call insurance and find out what is covered and to call me back.

## 2016-08-13 NOTE — Telephone Encounter (Signed)
[name] is requesting a return call. She did contact insurance company and stated that its still going to be expensive but need to discuss this with you. [phone]

## 2016-08-13 NOTE — Telephone Encounter (Signed)
Please call patient. Already on Aricept

## 2016-08-14 DIAGNOSIS — H401133 Primary open-angle glaucoma, bilateral, severe stage: Secondary | ICD-10-CM | POA: Diagnosis not present

## 2016-08-14 DIAGNOSIS — H18231 Secondary corneal edema, right eye: Secondary | ICD-10-CM | POA: Diagnosis not present

## 2016-09-10 ENCOUNTER — Ambulatory Visit: Payer: Medicare HMO | Admitting: Family Medicine

## 2016-09-15 DIAGNOSIS — H401133 Primary open-angle glaucoma, bilateral, severe stage: Secondary | ICD-10-CM | POA: Diagnosis not present

## 2016-09-15 DIAGNOSIS — H18231 Secondary corneal edema, right eye: Secondary | ICD-10-CM | POA: Diagnosis not present

## 2016-09-29 ENCOUNTER — Ambulatory Visit: Payer: Medicare HMO

## 2016-11-03 ENCOUNTER — Ambulatory Visit: Payer: Medicare HMO

## 2016-11-04 ENCOUNTER — Telehealth: Payer: Self-pay | Admitting: Family Medicine

## 2016-11-04 DIAGNOSIS — E7800 Pure hypercholesterolemia, unspecified: Secondary | ICD-10-CM

## 2016-11-04 NOTE — Telephone Encounter (Signed)
appt made for 11/12/16

## 2016-11-04 NOTE — Telephone Encounter (Signed)
He needs follow up, missed his one month follow up

## 2016-11-04 NOTE — Telephone Encounter (Signed)
Patient requesting refill of Simvastatin to [facility].

## 2016-11-12 ENCOUNTER — Encounter: Payer: Self-pay | Admitting: Family Medicine

## 2016-11-12 ENCOUNTER — Ambulatory Visit (INDEPENDENT_AMBULATORY_CARE_PROVIDER_SITE_OTHER): Payer: Medicare HMO | Admitting: Family Medicine

## 2016-11-12 VITALS — BP 134/60 | HR 65 | Temp 97.9°F | Resp 14 | Wt 117.6 lb

## 2016-11-12 DIAGNOSIS — I10 Essential (primary) hypertension: Secondary | ICD-10-CM | POA: Diagnosis not present

## 2016-11-12 DIAGNOSIS — R4189 Other symptoms and signs involving cognitive functions and awareness: Secondary | ICD-10-CM

## 2016-11-12 DIAGNOSIS — F329 Major depressive disorder, single episode, unspecified: Secondary | ICD-10-CM | POA: Diagnosis not present

## 2016-11-12 DIAGNOSIS — E7800 Pure hypercholesterolemia, unspecified: Secondary | ICD-10-CM | POA: Diagnosis not present

## 2016-11-12 DIAGNOSIS — F332 Major depressive disorder, recurrent severe without psychotic features: Secondary | ICD-10-CM

## 2016-11-12 DIAGNOSIS — H548 Legal blindness, as defined in USA: Secondary | ICD-10-CM | POA: Diagnosis not present

## 2016-11-12 DIAGNOSIS — R440 Auditory hallucinations: Secondary | ICD-10-CM

## 2016-11-12 DIAGNOSIS — F0150 Vascular dementia without behavioral disturbance: Secondary | ICD-10-CM

## 2016-11-12 DIAGNOSIS — F0153 Vascular dementia, unspecified severity, with mood disturbance: Secondary | ICD-10-CM

## 2016-11-12 DIAGNOSIS — R69 Illness, unspecified: Secondary | ICD-10-CM | POA: Diagnosis not present

## 2016-11-12 NOTE — Progress Notes (Signed)
Name: Sean Phelps   MRN: [id number]    DOB: 09-05-1940   Date:11/12/2016       Progress Note  Subjective  Chief Complaint  Chief Complaint  Patient presents with  . Medication Refill  . Memory Loss    meds not working, sleeps to much in the day not enough at night and talking alot to himself    HPI   HTN: he is taking medication daily for bp, and denies side effects, no chest pain or palpitation. BP is at goal.  Hyperlipidemia: taking simvastatin, denies myalgia, reviewed labs, and it is at goal , tolerating medication well   Dementia: he is forgetful, misplaced things inside the house, over the past month he has developed  hallucinations ( hearing and seeing things), also has paranoid behavior - he told his children that he thinks his wife is selling drugs.Wiife states his memory has declined rapidly over the past month, seems to be short term mostly, but also has long term memory loss. He has been on Aricept since 2015, we gave him Namenda in June but wife was not able to afford medications. Brain MRI showed microvascular disease, MMS on 06/13 down to 17. We checked multiple labs on his last visit, today we will arrange for home health visit and referral to neurologist  Depression: he has a long history of depression, but worse since Summer 2017 , legally blind of right eye and vision has decreased, He has been unable to work since August 2017. Gets bored at home, wife states he sleeps all the time. Not getting out of the house. He has suicidal thoughts, he denies suicidal planning. Wife states she put away the gun. Advised to take him to [facility] if any suicidal planning or worsening of hallucinations.     Patient Active Problem List   Diagnosis Date Noted  . Thoracic aortic atherosclerosis ([facility]) 07/20/2016  . Corneal edema of right eye 05/19/2016  . Pseudophakia of right eye 04/09/2016  . Status post cataract extraction and insertion of intraocular lens of left eye 04/09/2016  .  Primary open angle glaucoma of right eye, severe stage 08/10/2015  . Status post eye surgery 08/10/2015  . Recurrent major depression-severe ([facility]) 07/13/2015  . Hypertension, benign 07/13/2015  . Hyperlipidemia 07/13/2015  . Cataract 07/13/2015  . Abnormal brain MRI 07/13/2015  . Legally blind in right eye, as defined in [place] 07/13/2015  . Anemia of chronic disease 07/13/2015  . Dementia, vascular, with depression 09/05/2014  . Benign enlargement of prostate 03/22/2009    Past Surgical History:  Procedure Laterality Date  . CATARACT EXTRACTION Right   . EYE SURGERY      Family History  Problem Relation Age of Onset  . Leukemia Father   . Emphysema Father   . Alcohol abuse Mother     Social History   Social History  . Marital status: Married    Spouse name: N/A  . Number of children: N/A  . Years of education: N/A   Occupational History  . Not on file.   Social History Main Topics  . Smoking status: Former Smoker    Years: 25.00    Types: Cigarettes    Quit date: 12/25/1965  . Smokeless tobacco: Never Used  . Alcohol use No  . Drug use: No  . Sexual activity: No   Other Topics Concern  . Not on file   Social History Narrative   Married   Unemployed since eye problems Summer 2017  Current Outpatient Prescriptions:  .  aspirin 81 MG tablet, Take 81 mg by mouth daily., Disp: , Rfl:  .  citalopram (CELEXA) 20 MG tablet, TAKE 1 TABLET(20 MG) BY MOUTH DAILY, Disp: 90 tablet, Rfl: 1 .  donepezil (ARICEPT) 10 MG tablet, Take 1 tablet (10 mg total) by mouth at bedtime., Disp: 90 tablet, Rfl: 1 .  losartan (COZAAR) 50 MG tablet, TAKE 1 TABLET(50 MG) BY MOUTH DAILY, Disp: 90 tablet, Rfl: 1 .  simvastatin (ZOCOR) 40 MG tablet, TAKE 1 TABLET(40 MG) BY MOUTH AT BEDTIME, Disp: 90 tablet, Rfl: 1 .  timolol (TIMOPTIC) 0.5 % ophthalmic solution, Place 1 drop into the left eye 2 (two) times daily. , Disp: , Rfl:  .  atropine 1 % ophthalmic solution, Place 1 drop into the  right eye 2 (two) times daily., Disp: , Rfl:  .  prednisoLONE acetate (PRED FORTE) 1 % ophthalmic suspension, Place 1 drop into the right eye 4 (four) times daily. , Disp: , Rfl:   Allergies  Allergen Reactions  . Brinzolamide-Brimonidine Other (See Comments)    Red and itchy     ROS  Constitutional: Negative for fever or weight change.  Respiratory: Negative for cough and shortness of breath.   Cardiovascular: Negative for chest pain or palpitations.  Gastrointestinal: Negative for abdominal pain, no bowel changes.  Musculoskeletal: Positive  for gait problem ( legally blind)  No joint swelling.  Skin: Negative for rash.  Neurological: Negative for dizziness or headache.  No other specific complaints in a complete review of systems (except as listed in HPI above).   Objective  Vitals:   11/12/16 1604  BP: 134/60  Pulse: 65  Resp: 14  Temp: 97.9 F (36.6 C)  TempSrc: Oral  SpO2: 98%  Weight: 117 lb 9.6 oz (53.3 kg)    Body mass index is 21.51 kg/m.  Physical Exam  Constitutional: Patient appears well-developed and well-nourished. Obese  No distress.  HEENT: head atraumatic, normocephalic, pupils equal and reactive to light,  neck supple, throat within normal limits Cardiovascular: Normal rate, regular rhythm and normal heart sounds.  No murmur heard. No BLE edema. Pulmonary/Chest: Effort normal and breath sounds normal. No respiratory distress. Abdominal: Soft.  There is no tenderness. Psychiatric: Patient has a normal mood and affect. behavior is normal. Judgment and thought content normal.  PHQ2/9: Depression screen [facility] 2/9 08/06/2016 03/19/2016 12/26/2015 12/26/2015 07/13/2015  Decreased Interest 0 3 1 0 0  Down, Depressed, Hopeless 0 0  PHQ - 2 Score 0 0  Altered sleeping - 3 3 - -  Tired, decreased energy - 0 0 - -  Change in appetite - 0 0 - -  Feeling bad or failure about yourself  - 1 3 - -  Trouble concentrating - 0 3 - -  Moving slowly or  fidgety/restless - 2 2 - -  Suicidal thoughts - 0 2 - -  PHQ-9 Score - 11 16 - -  Difficult doing work/chores - Very difficult Extremely dIfficult - -     Fall Risk: Fall Risk  08/06/2016 12/26/2015 07/13/2015 09/05/2014  Falls in the past year? Yes Yes No No  Number falls in past yr: 2 or more 2 or more - -  Injury with Fall? Yes No - -  Comment Fell down the steps and hit his head on cement - - -  Risk for fall due to : Impaired balance/gait Impaired vision - -  Follow up -  Falls prevention discussed - -     Assessment & Plan  1. Legally blind in right eye, as defined in [place]  - Ambulatory referral to Home Health  2. Dementia, vascular, with depression  - Ambulatory referral to Neurology - Ambulatory referral to Home Health   3. Severe episode of recurrent major depressive disorder, without psychotic features ([facility])  - Ambulatory referral to Home Health  4. Cognitive decline  - Ambulatory referral to Neurology - Ambulatory referral to Home Health We added Namenda on his last visit, but too expensive and he was not able to afford it.   5. Hypertension, benign  At goal   6. Pure hypercholesterolemia  Continue medication  7. Auditory hallucinations  - Ambulatory referral to Neurology - he states usually comes following a dream, he denies voices telling him to kill himself of others. Usually more paranoid thoughts, like someone will rob them. Discussed need to take him to [facility] if significant symptoms - Ambulatory referral to Home Health  Daughter and wife came in with him today

## 2016-11-19 DIAGNOSIS — Z87440 Personal history of urinary (tract) infections: Secondary | ICD-10-CM | POA: Diagnosis not present

## 2016-11-19 DIAGNOSIS — H401113 Primary open-angle glaucoma, right eye, severe stage: Secondary | ICD-10-CM | POA: Diagnosis not present

## 2016-11-19 DIAGNOSIS — D649 Anemia, unspecified: Secondary | ICD-10-CM | POA: Diagnosis not present

## 2016-11-19 DIAGNOSIS — H548 Legal blindness, as defined in USA: Secondary | ICD-10-CM | POA: Diagnosis not present

## 2016-11-19 DIAGNOSIS — I10 Essential (primary) hypertension: Secondary | ICD-10-CM | POA: Diagnosis not present

## 2016-11-19 DIAGNOSIS — I700 Atherosclerosis of aorta: Secondary | ICD-10-CM | POA: Diagnosis not present

## 2016-11-19 DIAGNOSIS — N400 Enlarged prostate without lower urinary tract symptoms: Secondary | ICD-10-CM | POA: Diagnosis not present

## 2016-11-19 DIAGNOSIS — Z7982 Long term (current) use of aspirin: Secondary | ICD-10-CM | POA: Diagnosis not present

## 2016-11-19 DIAGNOSIS — R69 Illness, unspecified: Secondary | ICD-10-CM | POA: Diagnosis not present

## 2016-11-21 ENCOUNTER — Telehealth: Payer: Self-pay | Admitting: Family Medicine

## 2016-11-21 NOTE — Telephone Encounter (Signed)
[facility] is requesting a verbal for Home Health 2 times weekly for 4 weeks [phone]

## 2016-11-24 NOTE — Telephone Encounter (Signed)
Sean Phelps has been notified at [facility].

## 2016-11-28 DIAGNOSIS — I700 Atherosclerosis of aorta: Secondary | ICD-10-CM | POA: Diagnosis not present

## 2016-11-28 DIAGNOSIS — R69 Illness, unspecified: Secondary | ICD-10-CM | POA: Diagnosis not present

## 2016-11-28 DIAGNOSIS — H401113 Primary open-angle glaucoma, right eye, severe stage: Secondary | ICD-10-CM | POA: Diagnosis not present

## 2016-11-28 DIAGNOSIS — Z87440 Personal history of urinary (tract) infections: Secondary | ICD-10-CM | POA: Diagnosis not present

## 2016-11-28 DIAGNOSIS — D649 Anemia, unspecified: Secondary | ICD-10-CM | POA: Diagnosis not present

## 2016-11-28 DIAGNOSIS — H548 Legal blindness, as defined in USA: Secondary | ICD-10-CM | POA: Diagnosis not present

## 2016-11-28 DIAGNOSIS — I10 Essential (primary) hypertension: Secondary | ICD-10-CM | POA: Diagnosis not present

## 2016-11-28 DIAGNOSIS — Z7982 Long term (current) use of aspirin: Secondary | ICD-10-CM | POA: Diagnosis not present

## 2016-11-28 DIAGNOSIS — N400 Enlarged prostate without lower urinary tract symptoms: Secondary | ICD-10-CM | POA: Diagnosis not present

## 2016-12-03 DIAGNOSIS — R413 Other amnesia: Secondary | ICD-10-CM | POA: Insufficient documentation

## 2016-12-19 DIAGNOSIS — Z87891 Personal history of nicotine dependence: Secondary | ICD-10-CM | POA: Insufficient documentation

## 2016-12-19 DIAGNOSIS — Z79899 Other long term (current) drug therapy: Secondary | ICD-10-CM | POA: Insufficient documentation

## 2016-12-19 DIAGNOSIS — I10 Essential (primary) hypertension: Secondary | ICD-10-CM | POA: Diagnosis not present

## 2016-12-19 DIAGNOSIS — F0151 Vascular dementia with behavioral disturbance: Secondary | ICD-10-CM | POA: Diagnosis not present

## 2016-12-19 DIAGNOSIS — Z7982 Long term (current) use of aspirin: Secondary | ICD-10-CM | POA: Diagnosis not present

## 2016-12-19 DIAGNOSIS — R69 Illness, unspecified: Secondary | ICD-10-CM | POA: Diagnosis not present

## 2016-12-19 DIAGNOSIS — R456 Violent behavior: Secondary | ICD-10-CM | POA: Diagnosis present

## 2016-12-19 DIAGNOSIS — J45909 Unspecified asthma, uncomplicated: Secondary | ICD-10-CM | POA: Diagnosis not present

## 2016-12-20 ENCOUNTER — Emergency Department
Admission: EM | Admit: 2016-12-20 | Discharge: 2016-12-21 | Disposition: A | Discharge status: Other Acute Inpatient Hospital | Payer: Medicare HMO | Attending: Emergency Medicine | Admitting: Emergency Medicine

## 2016-12-20 ENCOUNTER — Encounter: Payer: Self-pay | Admitting: Emergency Medicine

## 2016-12-20 DIAGNOSIS — F0391 Unspecified dementia with behavioral disturbance: Secondary | ICD-10-CM

## 2016-12-20 LAB — CBC WITH DIFFERENTIAL/PLATELET
Basophils Absolute: 0 10*3/uL (ref 0–0.1)
Basophils Relative: 1 %
Eosinophils Absolute: 0 10*3/uL (ref 0–0.7)
Eosinophils Relative: 0 %
HEMATOCRIT: 37.8 % — AB (ref 40.0–52.0)
HEMOGLOBIN: 12.6 g/dL — AB (ref 13.0–18.0)
Lymphocytes Relative: 16 %
Lymphs Abs: 0.9 10*3/uL — ABNORMAL LOW (ref 1.0–3.6)
MCH: 30.6 pg (ref 26.0–34.0)
MCHC: 33.4 g/dL (ref 32.0–36.0)
MCV: 91.4 fL (ref 80.0–100.0)
MONO ABS: 0.5 10*3/uL (ref 0.2–1.0)
MONOS PCT: 9 %
NEUTROS ABS: 4.2 10*3/uL (ref 1.4–6.5)
NEUTROS PCT: 74 %
Platelets: 166 10*3/uL (ref 150–440)
RBC: 4.14 MIL/uL — ABNORMAL LOW (ref 4.40–5.90)
RDW: 12 % (ref 11.5–14.5)
WBC: 5.6 10*3/uL (ref 3.8–10.6)

## 2016-12-20 LAB — COMPREHENSIVE METABOLIC PANEL
ALT: 14 U/L — AB (ref 17–63)
AST: 20 U/L (ref 15–41)
Albumin: 3.9 g/dL (ref 3.5–5.0)
Alkaline Phosphatase: 60 U/L (ref 38–126)
Anion gap: 11 (ref 5–15)
BUN: 25 mg/dL — AB (ref 6–20)
CHLORIDE: 104 mmol/L (ref 101–111)
CO2: 26 mmol/L (ref 22–32)
CREATININE: 0.93 mg/dL (ref 0.61–1.24)
Calcium: 9.3 mg/dL (ref 8.9–10.3)
GFR calc Af Amer: >60 mL/min (ref >60)
Glucose, Bld: 111 mg/dL — ABNORMAL HIGH (ref 65–99)
Potassium: 4 mmol/L (ref 3.5–5.1)
Sodium: 141 mmol/L (ref 135–145)
Total Bilirubin: 0.5 mg/dL (ref 0.3–1.2)
Total Protein: 7.8 g/dL (ref 6.5–8.1)

## 2016-12-20 LAB — SALICYLATE LEVEL: Salicylate Lvl: <7 mg/dL (ref 2.8–30.0)

## 2016-12-20 LAB — ETHANOL: Alcohol, Ethyl (B): <10 mg/dL (ref <10)

## 2016-12-20 LAB — ACETAMINOPHEN LEVEL: Acetaminophen (Tylenol), Serum: <10 ug/mL — ABNORMAL LOW (ref 10–30)

## 2016-12-20 LAB — TSH: TSH: 1.88 u[IU]/mL (ref 0.350–4.500)

## 2016-12-20 LAB — TROPONIN I

## 2016-12-20 MED ORDER — CITALOPRAM HYDROBROMIDE 20 MG PO TABS
20.0000 mg | ORAL_TABLET | Freq: Every day | ORAL | Status: DC
  Administered 2016-12-20 – 2016-12-21 (×2): 20 mg via ORAL
  Filled 2016-12-20 (×2): qty 1

## 2016-12-20 MED ORDER — HALOPERIDOL LACTATE 5 MG/ML IJ SOLN
5.0000 mg | Freq: Once | INTRAMUSCULAR | Status: AC
  Administered 2016-12-20: 5 mg via INTRAMUSCULAR

## 2016-12-20 MED ORDER — DONEPEZIL HCL 5 MG PO TABS
5.0000 mg | ORAL_TABLET | Freq: Every day | ORAL | Status: DC
  Administered 2016-12-20: 5 mg via ORAL
  Filled 2016-12-20 (×2): qty 1

## 2016-12-20 MED ORDER — TIMOLOL MALEATE 0.5 % OP SOLN
1.0000 [drp] | Freq: Two times a day (BID) | OPHTHALMIC | Status: DC
  Administered 2016-12-20 – 2016-12-21 (×3): 1 [drp] via OPHTHALMIC
  Filled 2016-12-20: qty 5

## 2016-12-20 MED ORDER — ASPIRIN EC 81 MG PO TBEC
81.0000 mg | DELAYED_RELEASE_TABLET | Freq: Every day | ORAL | Status: DC
  Administered 2016-12-20 – 2016-12-21 (×2): 81 mg via ORAL
  Filled 2016-12-20 (×2): qty 1

## 2016-12-20 MED ORDER — SIMVASTATIN 10 MG PO TABS
40.0000 mg | ORAL_TABLET | Freq: Every day | ORAL | Status: DC
  Administered 2016-12-20: 40 mg via ORAL
  Filled 2016-12-20: qty 4

## 2016-12-20 MED ORDER — LOSARTAN POTASSIUM 50 MG PO TABS
50.0000 mg | ORAL_TABLET | Freq: Every day | ORAL | Status: DC
  Administered 2016-12-20 – 2016-12-21 (×2): 50 mg via ORAL
  Filled 2016-12-20 (×2): qty 1

## 2016-12-20 MED ORDER — HALOPERIDOL LACTATE 5 MG/ML IJ SOLN
INTRAMUSCULAR | Status: AC
  Administered 2016-12-20: 5 mg via INTRAMUSCULAR
  Filled 2016-12-20: qty 1

## 2016-12-20 MED ORDER — PREDNISOLONE ACETATE 1 % OP SUSP
1.0000 [drp] | Freq: Four times a day (QID) | OPHTHALMIC | Status: DC
  Administered 2016-12-20 – 2016-12-21 (×3): 1 [drp] via OPHTHALMIC
  Filled 2016-12-20 (×2): qty 5

## 2016-12-20 MED ORDER — ATROPINE SULFATE 1 % OP SOLN
1.0000 [drp] | Freq: Two times a day (BID) | OPHTHALMIC | Status: DC
  Administered 2016-12-20 – 2016-12-21 (×3): 1 [drp] via OPHTHALMIC
  Filled 2016-12-20: qty 2

## 2016-12-20 NOTE — ED Notes (Signed)
[facility]; pt obtunded

## 2016-12-20 NOTE — ED Notes (Signed)
Pt offered shower.  Pt declined shower

## 2016-12-20 NOTE — ED Notes (Signed)
Spoke with daughter Jola BabinskiMarilyn about plan of care. Told daughter we would make sure psychiatrist called her and her sister to discuss plan of care when someone spoke with the pt.  Jola BabinskiMarilyn [phone] Drinda ButtsAnnette [phone]

## 2016-12-20 NOTE — ED Notes (Signed)
Patient visiting with his daughter at this time.

## 2016-12-20 NOTE — ED Notes (Signed)
Pt's daughter here to visit pt at this time. Informed her of visitor policy; wanded by [employer] security prior to entering secured area.

## 2016-12-20 NOTE — [facility] Counselor (Signed)
Spoke with representative at [employer] and she reported the pt is being reviewed.
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
## 2016-12-20 NOTE — BHH Counselor (Signed)
Patient has been accepted to [facility].  Patient assigned to room TBD Accepting physician is Dr. Mikey CollegeGannon.  Call report to [phone].  Representative was [employer].  ER Staff is aware of it Gabriel Rung([facility] Cordelia PenSherry Patient's Nurse)    Jola BabinskiMarilyn pt's daughter has been updated as well.  The pt can be transported anytime after 10 AM.

## 2016-12-20 NOTE — ED Notes (Signed)
Patient talking to daughter on the phone. Daughter updated on status.

## 2016-12-20 NOTE — ED Notes (Signed)
Patient is IVC/Patient has been accepted to [facility] and will be ready for transfer after 10 am on 10/28 per [place].
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
## 2016-12-20 NOTE — ED Notes (Signed)
Offered patient shower again patient still refused.
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
## 2016-12-20 NOTE — ED Notes (Signed)
Patient refused to go to restroom at this time

## 2016-12-20 NOTE — ED Notes (Addendum)
Cellphone, keys and wallet sent home with Daughter Vania ReaMarilynn Burnette, [phone] also contact sheet given to Norval Mortonnnette Crawford [phone] -- request to be called for disposition  Pt changed into burgundy scrubs, and 1 bag to locker room

## 2016-12-21 DIAGNOSIS — E785 Hyperlipidemia, unspecified: Secondary | ICD-10-CM | POA: Diagnosis not present

## 2016-12-21 DIAGNOSIS — R69 Illness, unspecified: Secondary | ICD-10-CM | POA: Diagnosis not present

## 2016-12-21 DIAGNOSIS — Z79899 Other long term (current) drug therapy: Secondary | ICD-10-CM | POA: Diagnosis not present

## 2016-12-21 DIAGNOSIS — I10 Essential (primary) hypertension: Secondary | ICD-10-CM | POA: Diagnosis not present

## 2016-12-21 DIAGNOSIS — F0151 Vascular dementia with behavioral disturbance: Secondary | ICD-10-CM | POA: Diagnosis not present

## 2016-12-21 NOTE — ED Provider Notes (Addendum)
-----------------------------------------   8:07 AM on 12/21/2016 -----------------------------------------   Blood pressure 136/79, pulse (!) 57, temperature 97.7 F (36.5 C), temperature source Oral, resp. rate 18, height 5\' 1"  (1.549 m), weight 54.4 kg (120 lb), SpO2 97 %.  The patient had no acute events since last update.  Calm and cooperative this morning.  [facility]  We anticipate likely transfer this morning at some point.   Minna AntisPaduchowski, Tara Wich, MD 12/21/16 [phone]   ----------------------------------------- 10:16 AM on 12/21/2016 -----------------------------------------  Patient appears well this morning.  No distress or acute issues.  Family is here with the patient sitting by the bedside.  Patient is able to ambulate without difficulty.  I believe the patient is safe for transfer at this time.   Minna AntisPaduchowski, Kiaja Shorty, MD 12/21/16 1016

## 2016-12-21 NOTE — ED Notes (Signed)
EMTALA reviewed. 

## 2016-12-21 NOTE — Progress Notes (Signed)
[facility] responded to a request for prayer from Daughter of Pt in ED-22. Pt appeared sleepy and the daughter appeared emotional. Daughter stated they were hoping for a discharge tomorrow, 12/22/16, and needed prayer for God's presence, which was provided.    12/21/16 1000  Clinical Encounter Type  Visited With Patient;Patient and family together  Visit Type Initial;Spiritual support  Referral From Family  Consult/Referral To Chaplain  Spiritual Encounters  Spiritual Needs Prayer

## 2016-12-22 DIAGNOSIS — R69 Illness, unspecified: Secondary | ICD-10-CM | POA: Diagnosis not present

## 2016-12-23 DIAGNOSIS — R69 Illness, unspecified: Secondary | ICD-10-CM | POA: Diagnosis not present

## 2016-12-29 DIAGNOSIS — Z79899 Other long term (current) drug therapy: Secondary | ICD-10-CM | POA: Diagnosis not present

## 2016-12-31 ENCOUNTER — Other Ambulatory Visit: Payer: Self-pay

## 2016-12-31 DIAGNOSIS — F332 Major depressive disorder, recurrent severe without psychotic features: Secondary | ICD-10-CM

## 2016-12-31 MED ORDER — CITALOPRAM HYDROBROMIDE 20 MG PO TABS: ORAL_TABLET | ORAL | 0 refills | Status: DC

## 2016-12-31 NOTE — Telephone Encounter (Signed)
Refill request for general medication:  Citalopram HBR  Last office visit: 11/12/2016  Last physical exam: None indicated  Follow up visit: None indicated

## 2017-01-07 ENCOUNTER — Telehealth: Payer: Self-pay | Admitting: Family Medicine

## 2017-01-07 NOTE — Telephone Encounter (Signed)
Copied from CRM [id number]. Topic: Quick Communication - See Telephone Encounter >> Jan 07, 2017  9:10 AM Rudi CocoLathan, Tenika Keeran M, NT wrote: CRM for notification. See Telephone encounter for:   01/07/17. Pt. Wife Sean Phelps called and said her husband Sean Phelps needs in home care and needs Dr. Carlynn PurlSowles to approve. (daughter) [name] will bring paperwork in today let wife know about the paperwork turn around. Any questions please give pt. Wife Sean KindsShirley Mccowen a call at [phone]

## 2017-01-07 NOTE — Telephone Encounter (Signed)
I will review paperwork , but sometimes they will require a face-to-face encounter

## 2017-01-20 DIAGNOSIS — F03B18 Unspecified dementia, moderate, with other behavioral disturbance: Secondary | ICD-10-CM | POA: Insufficient documentation

## 2017-01-20 DIAGNOSIS — R69 Illness, unspecified: Secondary | ICD-10-CM | POA: Diagnosis not present

## 2017-01-20 DIAGNOSIS — F0391 Unspecified dementia with behavioral disturbance: Secondary | ICD-10-CM | POA: Insufficient documentation

## 2017-01-28 ENCOUNTER — Encounter: Payer: Self-pay | Admitting: Family Medicine

## 2017-01-28 ENCOUNTER — Ambulatory Visit: Payer: Medicare HMO | Admitting: Family Medicine

## 2017-01-28 VITALS — BP 150/66 | HR 73 | Resp 12 | Ht 61.0 in | Wt 122.4 lb

## 2017-01-28 DIAGNOSIS — Z23 Encounter for immunization: Secondary | ICD-10-CM | POA: Diagnosis not present

## 2017-01-28 DIAGNOSIS — F0391 Unspecified dementia with behavioral disturbance: Secondary | ICD-10-CM | POA: Diagnosis not present

## 2017-01-28 DIAGNOSIS — F0153 Vascular dementia, unspecified severity, with mood disturbance: Secondary | ICD-10-CM

## 2017-01-28 DIAGNOSIS — I10 Essential (primary) hypertension: Secondary | ICD-10-CM | POA: Diagnosis not present

## 2017-01-28 DIAGNOSIS — F329 Major depressive disorder, single episode, unspecified: Secondary | ICD-10-CM

## 2017-01-28 DIAGNOSIS — F0150 Vascular dementia without behavioral disturbance: Secondary | ICD-10-CM

## 2017-01-28 DIAGNOSIS — R440 Auditory hallucinations: Secondary | ICD-10-CM

## 2017-01-28 DIAGNOSIS — R69 Illness, unspecified: Secondary | ICD-10-CM | POA: Diagnosis not present

## 2017-01-28 NOTE — Progress Notes (Signed)
Name: Sean Phelps   MRN: [id number]    DOB: [birth year]   Date:01/28/2017       Progress Note  Subjective  Chief Complaint  Chief Complaint  Patient presents with  . Dementia    HPI  Dementia with hallucinations and aggressive behavior: he is under the care of Dr. Melrose Nakayama, he had a significant change in behavior then end of October because he developed aggressive behavior and paranoia towards his wife. She called her oldest daughter and he was transported to [facility] by his daughter Anne Ng and from there he was admitted for two days and transfer to a behavioral health facility in [place] for one week , he was sent home with instructions to not stay by himself. Wife works full time and they need assistance in providing the care he needs during the day. He is currently on depakote and Aricept but still unable to cook, clean, take medication by himself and wander at times, still has hallucinations.   Patient Active Problem List   Diagnosis Date Noted  . Moderate dementia, with behavioral disturbance 01/20/2017  . Thoracic aortic atherosclerosis ([place]) 07/20/2016  . Corneal edema of right eye 05/19/2016  . Pseudophakia of right eye 04/09/2016  . Status post cataract extraction and insertion of intraocular lens of left eye 04/09/2016  . Primary open angle glaucoma of right eye, severe stage 08/10/2015  . Status post eye surgery 08/10/2015  . Recurrent major depression-severe ([place]) 07/13/2015  . Hypertension, benign 07/13/2015  . Hyperlipidemia 07/13/2015  . Cataract 07/13/2015  . Abnormal brain MRI 07/13/2015  . Legally blind in right eye, as defined in [place] 07/13/2015  . Anemia of chronic disease 07/13/2015  . Dementia, vascular, with depression 09/05/2014  . Benign enlargement of prostate 03/22/2009    Past Surgical History:  Procedure Laterality Date  . CATARACT EXTRACTION Right   . EYE SURGERY      Family History  Problem Relation Age of Onset  . Leukemia Father   . Emphysema  Father   . Alcohol abuse Mother     Social History   Socioeconomic History  . Marital status: Married    Spouse name: Not on file  . Number of children: Not on file  . Years of education: Not on file  . Highest education level: Not on file  Social Needs  . Financial resource strain: Not on file  . Food insecurity - worry: Not on file  . Food insecurity - inability: Not on file  . Transportation needs - medical: Not on file  . Transportation needs - non-medical: Not on file  Occupational History  . Not on file  Tobacco Use  . Smoking status: Former Smoker    Years: 25.00    Types: Cigarettes    Last attempt to quit: 12/25/1965    Years since quitting: 51.1  . Smokeless tobacco: Never Used  Substance and Sexual Activity  . Alcohol use: No    Alcohol/week: 0.0 oz  . Drug use: No  . Sexual activity: No  Other Topics Concern  . Not on file  Social History Narrative   Married   Unemployed since eye problems Summer 2017     Current Outpatient Medications:  .  aspirin 81 MG tablet, Take 81 mg by mouth daily., Disp: , Rfl:  .  citalopram (CELEXA) 20 MG tablet, TAKE 1 TABLET(20 MG) BY MOUTH DAILY, Disp: 90 tablet, Rfl: 0 .  donepezil (ARICEPT) 10 MG tablet, Take 1 tablet (10 mg total) by  mouth at bedtime. (Patient taking differently: Take 5 mg by mouth at bedtime. ), Disp: 90 tablet, Rfl: 1 .  losartan (COZAAR) 50 MG tablet, TAKE 1 TABLET(50 MG) BY MOUTH DAILY, Disp: 90 tablet, Rfl: 1 .  simvastatin (ZOCOR) 40 MG tablet, TAKE 1 TABLET(40 MG) BY MOUTH AT BEDTIME, Disp: 90 tablet, Rfl: 1 .  timolol (TIMOPTIC) 0.5 % ophthalmic solution, Place 1 drop into the left eye 2 (two) times daily. , Disp: , Rfl:  .  divalproex (DEPAKOTE) 125 MG DR tablet, Take 1 tablet by mouth every evening., Disp: , Rfl:   Allergies  Allergen Reactions  . Brinzolamide-Brimonidine Other (See Comments)    Red and itchy     ROS  Ten systems reviewed and is negative except as mentioned in HPI    Objective  Vitals:   01/28/17 1525  BP: (!) 150/66  Pulse: 73  Resp: 12  SpO2: 98%  Weight: 122 lb 6.4 oz (55.5 kg)  Height: '5\' 1"'  (1.549 m)    Body mass index is 23.13 kg/m.  Physical Exam  Constitutional: Patient appears well-developed and well-nourished.  No distress.  HEENT: head atraumatic, normocephalic, legally blind , neck supple, throat within normal limits Cardiovascular: Normal rate, regular rhythm and normal heart sounds.  No murmur heard. No BLE edema. Pulmonary/Chest: Effort normal and breath sounds normal. No respiratory distress. Abdominal: Soft.  There is no tenderness. Psychiatric: quiet, not very engaging.   Recent Results (from the past 2160 hour(s))  Comprehensive metabolic panel     Status: Abnormal   Collection Time: 12/20/16 12:10 AM  Result Value Ref Range   Sodium 141 135 - 145 mmol/L   Potassium 4.0 3.5 - 5.1 mmol/L   Chloride 104 101 - 111 mmol/L   CO2 26 22 - 32 mmol/L   Glucose, Bld 111 (H) 65 - 99 mg/dL   BUN 25 (H) 6 - 20 mg/dL   Creatinine, Ser 0.93 0.61 - 1.24 mg/dL   Calcium 9.3 8.9 - 10.3 mg/dL   Total Protein 7.8 6.5 - 8.1 g/dL   Albumin 3.9 3.5 - 5.0 g/dL   AST 20 15 - 41 U/L   ALT 14 (L) 17 - 63 U/L   Alkaline Phosphatase 60 38 - 126 U/L   Total Bilirubin 0.5 0.3 - 1.2 mg/dL   GFR calc non Af Amer >60 >60 mL/min   GFR calc Af Amer >60 >60 mL/min    Comment: (NOTE) The eGFR has been calculated using the CKD EPI equation. This calculation has not been validated in all clinical situations. eGFR's persistently <60 mL/min signify possible Chronic Kidney Disease.    Anion gap 11 5 - 15  Acetaminophen level     Status: Abnormal   Collection Time: 12/20/16 12:10 AM  Result Value Ref Range   Acetaminophen (Tylenol), Serum <10 (L) 10 - 30 ug/mL    Comment:        THERAPEUTIC CONCENTRATIONS VARY SIGNIFICANTLY. A RANGE OF 10-30 ug/mL MAY BE AN EFFECTIVE CONCENTRATION FOR MANY PATIENTS. HOWEVER, SOME ARE BEST TREATED AT  CONCENTRATIONS OUTSIDE THIS RANGE. ACETAMINOPHEN CONCENTRATIONS >150 ug/mL AT 4 HOURS AFTER INGESTION AND >50 ug/mL AT 12 HOURS AFTER INGESTION ARE OFTEN ASSOCIATED WITH TOXIC REACTIONS.   Ethanol     Status: None   Collection Time: 12/20/16 12:10 AM  Result Value Ref Range   Alcohol, Ethyl (B) <10 <10 mg/dL    Comment:        LOWEST DETECTABLE LIMIT FOR SERUM ALCOHOL IS  10 mg/dL FOR MEDICAL PURPOSES ONLY   Salicylate level     Status: None   Collection Time: 12/20/16 12:10 AM  Result Value Ref Range   Salicylate Lvl <2.2 2.8 - 30.0 mg/dL  CBC with Differential     Status: Abnormal   Collection Time: 12/20/16 12:10 AM  Result Value Ref Range   WBC 5.6 3.8 - 10.6 K/uL   RBC 4.14 (L) 4.40 - 5.90 MIL/uL   Hemoglobin 12.6 (L) 13.0 - 18.0 g/dL   HCT 37.8 (L) 40.0 - 52.0 %   MCV 91.4 80.0 - 100.0 fL   MCH 30.6 26.0 - 34.0 pg   MCHC 33.4 32.0 - 36.0 g/dL   RDW 12.0 11.5 - 14.5 %   Platelets 166 150 - 440 K/uL   Neutrophils Relative % 74 %   Neutro Abs 4.2 1.4 - 6.5 K/uL   Lymphocytes Relative 16 %   Lymphs Abs 0.9 (L) 1.0 - 3.6 K/uL   Monocytes Relative 9 %   Monocytes Absolute 0.5 0.2 - 1.0 K/uL   Eosinophils Relative 0 %   Eosinophils Absolute 0.0 0 - 0.7 K/uL   Basophils Relative 1 %   Basophils Absolute 0.0 0 - 0.1 K/uL  TSH     Status: None   Collection Time: 12/20/16 12:10 AM  Result Value Ref Range   TSH 1.880 0.350 - 4.500 uIU/mL    Comment: Performed by a 3rd Generation assay with a functional sensitivity of <=0.01 uIU/mL.  Troponin I     Status: None   Collection Time: 12/20/16 12:10 AM  Result Value Ref Range   Troponin I <0.03 <0.03 ng/mL     PHQ2/9: Depression screen [facility] 2/9 08/06/2016 03/19/2016 12/26/2015 12/26/2015 07/13/2015  Decreased Interest 0 3 1 0 0  Down, Depressed, Hopeless '1 2 2 ' 0 0  PHQ - 2 Score '1 5 3 ' 0 0  Altered sleeping - 3 3 - -  Tired, decreased energy - 0 0 - -  Change in appetite - 0 0 - -  Feeling bad or failure about yourself  -  1 3 - -  Trouble concentrating - 0 3 - -  Moving slowly or fidgety/restless - 2 2 - -  Suicidal thoughts - 0 2 - -  PHQ-9 Score - 11 16 - -  Difficult doing work/chores - Very difficult Extremely dIfficult - -     Fall Risk: Fall Risk  01/28/2017 08/06/2016 12/26/2015 07/13/2015 09/05/2014  Falls in the past year? No Yes Yes No No  Number falls in past yr: - 2 or more 2 or more - -  Injury with Fall? - Yes No - -  Comment - Fell down the steps and hit his head on cement - - -  Risk for fall due to : - Impaired balance/gait Impaired vision - -  Follow up - - Falls prevention discussed - -     Functional Status Survey: Is the patient deaf or have difficulty hearing?: Yes Does the patient have difficulty seeing, even when wearing glasses/contacts?: Yes Does the patient have difficulty concentrating, remembering, or making decisions?: Yes Does the patient have difficulty walking or climbing stairs?: Yes Does the patient have difficulty dressing or bathing?: No Does the patient have difficulty doing errands alone such as visiting a doctor's office or shopping?: Yes    Assessment & Plan  1. Dementia, vascular, with depression  I am going to fill out forms to increase his care at home  2. Flu vaccine need  - Flu vaccine HIGH DOSE PF  3. Auditory hallucinations  Still present  4. Dementia with behavioral disturbance, unspecified dementia type  Had episode of aggression and had to go to hospital, unsafe to be home alone   5. Hypertension, benign  bp is elevated, discussed living will and power of attorney of health care

## 2017-04-07 ENCOUNTER — Other Ambulatory Visit: Payer: Self-pay | Admitting: Family Medicine

## 2017-04-07 DIAGNOSIS — I10 Essential (primary) hypertension: Secondary | ICD-10-CM

## 2017-04-07 NOTE — Telephone Encounter (Signed)
Hypertension medication request: Losartan  Last office visit pertaining to hypertension: 01/28/2017   BP Readings from Last 3 Encounters:  01/28/17 (!) 150/66  12/21/16 123/65  11/12/16 134/60    Lab Results  Component Value Date   CREATININE 0.93 12/20/2016   BUN 25 (H) 12/20/2016   NA 141 12/20/2016   K 4.0 12/20/2016   CL 104 12/20/2016   CO2 26 12/20/2016     No Follow-up on file.

## 2017-04-07 NOTE — Telephone Encounter (Signed)
Refill request for Hypertension medication:  Losartan 50 mg  Last office visit pertaining to hypertension:   BP Readings from Last 3 Encounters:  01/28/17 (!) 150/66  12/21/16 123/65  11/12/16 134/60     Lab Results  Component Value Date   CREATININE 0.93 12/20/2016   BUN 25 (H) 12/20/2016   NA 141 12/20/2016   K 4.0 12/20/2016   CL 104 12/20/2016   CO2 26 12/20/2016     No Follow-up on file.

## 2017-05-03 ENCOUNTER — Other Ambulatory Visit: Payer: Self-pay | Admitting: Family Medicine

## 2017-05-03 DIAGNOSIS — F332 Major depressive disorder, recurrent severe without psychotic features: Secondary | ICD-10-CM

## 2017-05-04 ENCOUNTER — Other Ambulatory Visit: Payer: Self-pay

## 2017-05-04 DIAGNOSIS — F332 Major depressive disorder, recurrent severe without psychotic features: Secondary | ICD-10-CM

## 2017-05-04 MED ORDER — CITALOPRAM HYDROBROMIDE 20 MG PO TABS: ORAL_TABLET | ORAL | 0 refills | Status: DC

## 2017-05-04 NOTE — Telephone Encounter (Signed)
Spoke with wife and she scheduled him an appointment for 05-27-17

## 2017-05-04 NOTE — Telephone Encounter (Signed)
Refill request for general medication. Celexa to [facility]  Last office visit: 01/28/2017   No Follow-up on file.

## 2017-05-04 NOTE — Telephone Encounter (Signed)
Refill request for general medication. Celexa   Last office visit: 01/28/2017  No Follow-up on file.

## 2017-05-27 ENCOUNTER — Ambulatory Visit (INDEPENDENT_AMBULATORY_CARE_PROVIDER_SITE_OTHER): Payer: Medicare HMO | Admitting: Family Medicine

## 2017-05-27 VITALS — BP 142/68 | HR 60 | Temp 98.3°F | Resp 14 | Ht 61.0 in | Wt 121.6 lb

## 2017-05-27 DIAGNOSIS — R69 Illness, unspecified: Secondary | ICD-10-CM | POA: Diagnosis not present

## 2017-05-27 DIAGNOSIS — F329 Major depressive disorder, single episode, unspecified: Secondary | ICD-10-CM | POA: Diagnosis not present

## 2017-05-27 DIAGNOSIS — F332 Major depressive disorder, recurrent severe without psychotic features: Secondary | ICD-10-CM | POA: Diagnosis not present

## 2017-05-27 DIAGNOSIS — I10 Essential (primary) hypertension: Secondary | ICD-10-CM

## 2017-05-27 DIAGNOSIS — D638 Anemia in other chronic diseases classified elsewhere: Secondary | ICD-10-CM

## 2017-05-27 DIAGNOSIS — F0153 Vascular dementia, unspecified severity, with mood disturbance: Secondary | ICD-10-CM

## 2017-05-27 DIAGNOSIS — I700 Atherosclerosis of aorta: Secondary | ICD-10-CM

## 2017-05-27 DIAGNOSIS — F0391 Unspecified dementia with behavioral disturbance: Secondary | ICD-10-CM

## 2017-05-27 DIAGNOSIS — F0150 Vascular dementia without behavioral disturbance: Secondary | ICD-10-CM | POA: Diagnosis not present

## 2017-05-27 MED ORDER — DONEPEZIL HCL 5 MG PO TABS: 5.0000 mg | ORAL_TABLET | Freq: Every day | ORAL | 0 refills | Status: DC

## 2017-05-27 MED ORDER — CITALOPRAM HYDROBROMIDE 20 MG PO TABS: ORAL_TABLET | ORAL | 1 refills | Status: DC

## 2017-05-27 MED ORDER — LOSARTAN POTASSIUM 50 MG PO TABS: 50.0000 mg | ORAL_TABLET | Freq: Every day | ORAL | 1 refills | Status: DC

## 2017-05-27 NOTE — Progress Notes (Signed)
Name: Sean Phelps   MRN: [id number]    DOB: 1940-06-26   Date:05/27/2017       Progress Note  Subjective  Chief Complaint  Chief Complaint  Patient presents with  . Medication Refill    4 month F/U  . Hypertension    Denies any symptoms  . Dementia  . Hyperlipidemia  . Depression    Wife states he sleeps all the time and constantly eats    HPI  HTN: he is taking medication daily for bp, and denies side effects, no chest pain, no dizziness  or palpitation.   Hyperlipidemia: taking simvastatin, denies myalgia, reviewed labs, and it is at goal ,discussed his quality of life and daughter and mother decided to stop his medication at this time.   Dementia: he is forgetful, misplaced things inside the house, over the past month he has developed  hallucinations ( hearing and seeing things), also has paranoid behavior - he told his children that he thinks his wife is selling drugs. He recently had an hallucination that his friend was at his house talking to him. He has been on Aricept since 2015, we gave him Namenda in June but wife was not able to afford medications. Brain MRI showed microvascular disease. Seeing neurologist and is on lower dose of Aricept.   Depression: he has a long history of depression, but worse after Summer 2017 , legally blind of right eye and vision has decreased, He has been unable to work since August 2017. Daughter thinks he is happy now, likes to eat and seems to have a good mood, wife thinks he is depressed, he is unable to fill Phq9 for [place], advised to continue Celexa.   Thoracic atherosclerosis.: discussed with daughter and mother, we will stop statin therapy at this time   Patient Active Problem List   Diagnosis Date Noted  . Moderate dementia, with behavioral disturbance 01/20/2017  . Thoracic aortic atherosclerosis ([facility]) 07/20/2016  . Corneal edema of right eye 05/19/2016  . Pseudophakia of right eye 04/09/2016  . Status post cataract extraction  and insertion of intraocular lens of left eye 04/09/2016  . Primary open angle glaucoma of right eye, severe stage 08/10/2015  . Status post eye surgery 08/10/2015  . Recurrent major depression-severe ([facility]) 07/13/2015  . Hypertension, benign 07/13/2015  . Hyperlipidemia 07/13/2015  . Cataract 07/13/2015  . Abnormal brain MRI 07/13/2015  . Legally blind in right eye, as defined in [place] 07/13/2015  . Anemia of chronic disease 07/13/2015  . Dementia, vascular, with depression 09/05/2014  . Benign enlargement of prostate 03/22/2009    Past Surgical History:  Procedure Laterality Date  . CATARACT EXTRACTION Right   . EYE SURGERY      Family History  Problem Relation Age of Onset  . Leukemia Father   . Emphysema Father   . Alcohol abuse Mother     Social History   Socioeconomic History  . Marital status: Married    Spouse name: Not on file  . Number of children: Not on file  . Years of education: Not on file  . Highest education level: Not on file  Occupational History  . Not on file  Social Needs  . Financial resource strain: Not on file  . Food insecurity:    Worry: Not on file    Inability: Not on file  . Transportation needs:    Medical: Not on file    Non-medical: Not on file  Tobacco Use  . Smoking  status: Former Smoker    Years: 25.00    Types: Cigarettes    Last attempt to quit: 12/25/1965    Years since quitting: 51.4  . Smokeless tobacco: Never Used  Substance and Sexual Activity  . Alcohol use: No    Alcohol/week: 0.0 oz  . Drug use: No  . Sexual activity: Never  Lifestyle  . Physical activity:    Days per week: Not on file    Minutes per session: Not on file  . Stress: Not on file  Relationships  . Social connections:    Talks on phone: Not on file    Gets together: Not on file    Attends religious service: Not on file    Active member of club or organization: Not on file    Attends meetings of clubs or organizations: Not on file     Relationship status: Not on file  . Intimate partner violence:    Fear of current or ex partner: Not on file    Emotionally abused: Not on file    Physically abused: Not on file    Forced sexual activity: Not on file  Other Topics Concern  . Not on file  Social History Narrative   Married   Unemployed since eye problems Summer 2017     Current Outpatient Medications:  .  aspirin 81 MG tablet, Take 81 mg by mouth daily., Disp: , Rfl:  .  citalopram (CELEXA) 20 MG tablet, TAKE 1 TABLET(20 MG) BY MOUTH DAILY, Disp: 90 tablet, Rfl: 1 .  divalproex (DEPAKOTE) 125 MG DR tablet, Take 1 tablet by mouth every evening., Disp: , Rfl:  .  losartan (COZAAR) 50 MG tablet, Take 1 tablet (50 mg total) by mouth daily., Disp: 90 tablet, Rfl: 1 .  timolol (TIMOPTIC) 0.5 % ophthalmic solution, Place 1 drop into the left eye 2 (two) times daily. , Disp: , Rfl:  .  donepezil (ARICEPT) 5 MG tablet, Take 1 tablet (5 mg total) by mouth at bedtime., Disp: 90 tablet, Rfl: 0  Allergies  Allergen Reactions  . Brinzolamide-Brimonidine Other (See Comments)    Red and itchy     ROS  Constitutional: Negative for fever or weight change.  Respiratory: Negative for cough and shortness of breath.   Cardiovascular: Negative for chest pain or palpitations.  Gastrointestinal: Negative for abdominal pain, no bowel changes.  Musculoskeletal: Positive  for gait problem but no joint swelling.  Skin: Negative for rash.  Neurological: Negative for dizziness or headache.  No other specific complaints in a complete review of systems (except as listed in HPI above).  Objective  Vitals:   05/27/17 1450  BP: (!) 142/68  Pulse: 60  Resp: 14  Temp: 98.3 F (36.8 C)  TempSrc: Oral  SpO2: 98%  Weight: 121 lb 9.6 oz (55.2 kg)  Height: 5\' 1"  (1.549 m)    Body mass index is 22.98 kg/m.  Physical Exam  Constitutional: Patient appears well-developed and well-nourished.  No distress.  HEENT: head atraumatic,  normocephalic, neck supple, throat within normal limits, lenses on left eye, pupil not reactive on right Cardiovascular: Normal rate, regular rhythm and normal heart sounds.  No murmur heard. No BLE edema. Pulmonary/Chest: Effort normal and breath sounds normal. No respiratory distress. Abdominal: Soft.  There is no tenderness. Psychiatric: Patient has a normal mood and affect. behavior is normal. Difficulty to understand, does not seem to be able to answer any question   [facility]9: [facility]/06/2016 03/19/2016  12/26/2015 12/26/2015 07/13/2015  Decreased Interest 0 3 1 0 0  Down, Depressed, Hopeless 1 2 2  0 0  PHQ - 2 Score 1 5 3  0 0  Altered sleeping - 3 3 - -  Tired, decreased energy - 0 0 - -  Change in appetite - 0 0 - -  Feeling bad or failure about yourself  - 1 3 - -  Trouble concentrating - 0 3 - -  Moving slowly or fidgety/restless - 2 2 - -  Suicidal thoughts - 0 2 - -  PHQ-9 Score - 11 16 - -  Difficult doing work/chores - Very difficult Extremely dIfficult - -     Fall Risk: Fall Risk  05/27/2017 01/28/2017 08/06/2016 12/26/2015 07/13/2015  Falls in the past year? Yes No Yes Yes No  Number falls in past yr: 2 or more - 2 or more 2 or more -  Injury with Fall? Yes - Yes No -  Comment - - Fell down the steps and hit his head on cement - -  Risk for fall due to : History of fall(s);Impaired vision - Impaired balance/gait Impaired vision -  Follow up - - - Falls prevention discussed -    Functional Status Survey: Is the patient deaf or have difficulty hearing?: Yes Does the patient have difficulty seeing, even when wearing glasses/contacts?: Yes(Legally Blind in Right eye and losing vision in left) Does the patient have difficulty concentrating, remembering, or making decisions?: Yes Does the patient have difficulty walking or climbing stairs?: No Does the patient have difficulty dressing or bathing?: No Does the patient have difficulty doing errands alone such as  visiting a doctor's office or shopping?: Yes   Assessment & Plan  1. Severe episode of recurrent major depressive disorder, without psychotic features (HCC)  - citalopram (CELEXA) 20 MG tablet; TAKE 1 TABLET(20 MG) BY MOUTH DAILY  Dispense: 90 tablet; Refill: 1  2. Hypertension, benign  At goal today  - losartan (COZAAR) 50 MG tablet; Take 1 tablet (50 mg total) by mouth daily.  Dispense: 90 tablet; Refill: 1  3. Dementia, vascular, with depression  - donepezil (ARICEPT) 5 MG tablet; Take 1 tablet (5 mg total) by mouth at bedtime.  Dispense: 90 tablet; Refill: 0  4. Thoracic aortic atherosclerosis ([facility])  Off statin starting today   5. Dementia with behavioral disturbance, unspecified dementia type  Stable   6. Anemia of chronic disease  No further investigation   Discussed living will or power attorney of health care.

## 2017-06-07 ENCOUNTER — Other Ambulatory Visit: Payer: Self-pay | Admitting: Family Medicine

## 2017-06-07 DIAGNOSIS — E7800 Pure hypercholesterolemia, unspecified: Secondary | ICD-10-CM

## 2017-06-08 NOTE — Telephone Encounter (Signed)
Stopped by provider on 05/27/17 Rx denied

## 2017-06-16 DIAGNOSIS — R69 Illness, unspecified: Secondary | ICD-10-CM | POA: Diagnosis not present

## 2017-07-03 ENCOUNTER — Other Ambulatory Visit: Payer: Self-pay | Admitting: Family Medicine

## 2017-07-03 DIAGNOSIS — I10 Essential (primary) hypertension: Secondary | ICD-10-CM

## 2017-07-26 ENCOUNTER — Telehealth: Payer: Self-pay | Admitting: Family Medicine

## 2017-07-26 DIAGNOSIS — I10 Essential (primary) hypertension: Secondary | ICD-10-CM

## 2017-07-31 NOTE — Telephone Encounter (Signed)
Tiffany called and spoke with wife [name] and stated RX was sent in on last visit [facility] m[facility]

## 2017-07-31 NOTE — Telephone Encounter (Signed)
Wife is calling - would like to know why rx is declined. cb is [phone]

## 2017-08-17 DIAGNOSIS — R269 Unspecified abnormalities of gait and mobility: Secondary | ICD-10-CM | POA: Diagnosis not present

## 2017-08-17 DIAGNOSIS — R69 Illness, unspecified: Secondary | ICD-10-CM | POA: Diagnosis not present

## 2017-08-17 DIAGNOSIS — R32 Unspecified urinary incontinence: Secondary | ICD-10-CM | POA: Diagnosis not present

## 2017-08-17 DIAGNOSIS — Z809 Family history of malignant neoplasm, unspecified: Secondary | ICD-10-CM | POA: Diagnosis not present

## 2017-08-17 DIAGNOSIS — I10 Essential (primary) hypertension: Secondary | ICD-10-CM | POA: Diagnosis not present

## 2017-08-17 DIAGNOSIS — Z823 Family history of stroke: Secondary | ICD-10-CM | POA: Diagnosis not present

## 2017-08-17 DIAGNOSIS — G309 Alzheimer's disease, unspecified: Secondary | ICD-10-CM | POA: Diagnosis not present

## 2017-08-17 DIAGNOSIS — H269 Unspecified cataract: Secondary | ICD-10-CM | POA: Diagnosis not present

## 2017-08-17 DIAGNOSIS — H5461 Unqualified visual loss, right eye, normal vision left eye: Secondary | ICD-10-CM | POA: Diagnosis not present

## 2017-08-17 DIAGNOSIS — R451 Restlessness and agitation: Secondary | ICD-10-CM | POA: Diagnosis not present

## 2017-11-26 ENCOUNTER — Ambulatory Visit: Payer: Medicare HMO | Admitting: Family Medicine

## 2017-11-30 ENCOUNTER — Ambulatory Visit (INDEPENDENT_AMBULATORY_CARE_PROVIDER_SITE_OTHER): Payer: Medicare HMO | Admitting: Family Medicine

## 2017-11-30 ENCOUNTER — Encounter: Payer: Self-pay | Admitting: Family Medicine

## 2017-11-30 VITALS — BP 136/64 | HR 61 | Temp 97.5°F | Resp 16 | Ht 61.0 in | Wt 112.9 lb

## 2017-11-30 DIAGNOSIS — H548 Legal blindness, as defined in USA: Secondary | ICD-10-CM

## 2017-11-30 DIAGNOSIS — I700 Atherosclerosis of aorta: Secondary | ICD-10-CM | POA: Diagnosis not present

## 2017-11-30 DIAGNOSIS — Z23 Encounter for immunization: Secondary | ICD-10-CM | POA: Diagnosis not present

## 2017-11-30 DIAGNOSIS — D649 Anemia, unspecified: Secondary | ICD-10-CM | POA: Diagnosis not present

## 2017-11-30 DIAGNOSIS — I10 Essential (primary) hypertension: Secondary | ICD-10-CM | POA: Diagnosis not present

## 2017-11-30 DIAGNOSIS — F0150 Vascular dementia without behavioral disturbance: Secondary | ICD-10-CM

## 2017-11-30 DIAGNOSIS — R69 Illness, unspecified: Secondary | ICD-10-CM | POA: Diagnosis not present

## 2017-11-30 DIAGNOSIS — E440 Moderate protein-calorie malnutrition: Secondary | ICD-10-CM

## 2017-11-30 DIAGNOSIS — F329 Major depressive disorder, single episode, unspecified: Secondary | ICD-10-CM

## 2017-11-30 DIAGNOSIS — R252 Cramp and spasm: Secondary | ICD-10-CM | POA: Diagnosis not present

## 2017-11-30 DIAGNOSIS — F323 Major depressive disorder, single episode, severe with psychotic features: Secondary | ICD-10-CM

## 2017-11-30 DIAGNOSIS — F0153 Vascular dementia, unspecified severity, with mood disturbance: Secondary | ICD-10-CM

## 2017-11-30 MED ORDER — MIRTAZAPINE 15 MG PO TABS: 15.0000 mg | ORAL_TABLET | Freq: Every day | ORAL | 1 refills | Status: DC

## 2017-11-30 MED ORDER — CITALOPRAM HYDROBROMIDE 20 MG PO TABS: ORAL_TABLET | ORAL | 1 refills | Status: DC

## 2017-11-30 MED ORDER — LOSARTAN POTASSIUM 50 MG PO TABS: 50.0000 mg | ORAL_TABLET | Freq: Every day | ORAL | 1 refills | Status: DC

## 2017-11-30 NOTE — Progress Notes (Signed)
Name: Sean Phelps   MRN: [id number]    DOB: Jul 10, 1940   Date:11/30/2017       Progress Note  Subjective  Chief Complaint  Chief Complaint  Patient presents with  . Medication Refill  . Depression  . Dementia  . Hypertension    Denies any symptoms  . Hyperlipidemia    Cramps at night-in bilateral feet    HPI  HTN: he is taking medication daily for bp,  no chest pain, no dizziness  or palpitation.  No side effects of medication   Hyperlipidemia: it was decided on his last visit that because of his quality of life it was best to stop simvastatin and we will not longer check cholesterol levels   Dementia: he is forgetful, misplaced things inside the house,over the past 7 months he has developedhallucinations ( hearing and seeing things), also has paranoid behavior . He recently had an hallucination that his friend was at his house talking to him. He has been on Aricept since 2015, we gave him Namenda in June but wife was not able to afford medications. Brain MRI showed microvascular disease. Seeing neurologist and is on lower dose of Aricept also on Depakote  Malnutrition: lost another 10 lbs since last visit, he skips meals, wife still works part time and not sure if he eats when she is gone. We will try remeron and also advised to leave some food / snack ready for her.   Depression: he has a long history of depression, but worse after Summer 2017 , legally blind of right eye and vision has decreased, He has been unable to work since August 2017. He is losing more weight. Paranoid that someone will break in and that he wants a gun, but wife said not.   Thoracic atherosclerosis.: discussed with daughter and mother, off statin therapy .   Cramping leg: no longer drinking, we will check labs.    Patient Active Problem List   Diagnosis Date Noted  . Moderate dementia, with behavioral disturbance (HCC) 01/20/2017  . Thoracic aortic atherosclerosis (HCC) 07/20/2016  .  Corneal edema of right eye 05/19/2016  . Pseudophakia of right eye 04/09/2016  . Status post cataract extraction and insertion of intraocular lens of left eye 04/09/2016  . Primary open angle glaucoma of right eye, severe stage 08/10/2015  . Status post eye surgery 08/10/2015  . Recurrent major depression-severe (HCC) 07/13/2015  . Hypertension, benign 07/13/2015  . Hyperlipidemia 07/13/2015  . Cataract 07/13/2015  . Abnormal brain MRI 07/13/2015  . Legally blind in right eye, as defined in [place] 07/13/2015  . Anemia of chronic disease 07/13/2015  . Dementia, vascular, with depression (HCC) 09/05/2014  . Benign enlargement of prostate 03/22/2009    Past Surgical History:  Procedure Laterality Date  . CATARACT EXTRACTION Right   . EYE SURGERY      Family History  Problem Relation Age of Onset  . Leukemia Father   . Emphysema Father   . Alcohol abuse Mother     Social History   Socioeconomic History  . Marital status: Married    Spouse name: Not on file  . Number of children: Not on file  . Years of education: Not on file  . Highest education level: Not on file  Occupational History  . Not on file  Social Needs  . Financial resource strain: Not on file  . Food insecurity:    Worry: Not on file    Inability: Not on file  .  Transportation needs:    Medical: Not on file    Non-medical: Not on file  Tobacco Use  . Smoking status: Former Smoker    Years: 25.00    Types: Cigarettes    Last attempt to quit: 12/25/1965    Years since quitting: 51.9  . Smokeless tobacco: Never Used  Substance and Sexual Activity  . Alcohol use: No    Alcohol/week: 0.0 standard drinks  . Drug use: No  . Sexual activity: Never  Lifestyle  . Physical activity:    Days per week: Not on file    Minutes per session: Not on file  . Stress: Not on file  Relationships  . Social connections:    Talks on phone: Not on file    Gets together: Not on file    Attends religious service: Not on  file    Active member of club or organization: Not on file    Attends meetings of clubs or organizations: Not on file    Relationship status: Not on file  . Intimate partner violence:    Fear of current or ex partner: Not on file    Emotionally abused: Not on file    Physically abused: Not on file    Forced sexual activity: Not on file  Other Topics Concern  . Not on file  Social History Narrative   Married   Unemployed since eye problems Summer 2017     Current Outpatient Medications:  .  aspirin 81 MG tablet, Take 81 mg by mouth daily., Disp: , Rfl:  .  citalopram (CELEXA) 20 MG tablet, TAKE 1 TABLET(20 MG) BY MOUTH DAILY, Disp: 90 tablet, Rfl: 1 .  divalproex (DEPAKOTE) 125 MG DR tablet, Take 1 tablet by mouth every evening., Disp: , Rfl:  .  donepezil (ARICEPT) 5 MG tablet, Take 1 tablet (5 mg total) by mouth at bedtime., Disp: 90 tablet, Rfl: 0 .  losartan (COZAAR) 50 MG tablet, Take 1 tablet (50 mg total) by mouth daily., Disp: 90 tablet, Rfl: 1 .  timolol (TIMOPTIC) 0.5 % ophthalmic solution, Place 1 drop into the left eye 2 (two) times daily. , Disp: , Rfl:   Allergies  Allergen Reactions  . Brinzolamide-Brimonidine Other (See Comments)    Red and itchy    I personally reviewed active problem list, medication list, allergies, family history, social history with the patient/caregiver today.   ROS  Constitutional: Negative for fever , positive for weight change.  Respiratory: Negative for cough and shortness of breath.   Cardiovascular: Negative for chest pain or palpitations.  Gastrointestinal: Negative for abdominal pain, no bowel changes.  Musculoskeletal: Negative for gait problem or joint swelling.  Skin: Negative for rash.  Neurological: Negative for dizziness or headache.  No other specific complaints in a complete review of systems (except as listed in HPI above).  Objective  Vitals:   11/30/17 0910  BP: 136/64  Pulse: 61  Resp: 16  Temp: (!) 97.5 F  (36.4 C)  TempSrc: Oral  SpO2: 97%  Weight: 112 lb 14.4 oz (51.2 kg)  Height: 5\' 1"  (1.549 m)    Body mass index is 21.33 kg/m.  Physical Exam  Constitutional: Patient appears well-developed and malnourished, temporal waisting. No distress.  HEENT: head atraumatic, normocephalic, pupils equal and reactive to light,neck supple, throat within normal limits Cardiovascular: Normal rate, regular rhythm and normal heart sounds.  No murmur heard. No BLE edema. Pulmonary/Chest: Effort normal and breath sounds normal. No respiratory distress. Abdominal: Soft.  There is no tenderness. Psychiatric: Patient has a normal mood and affect. behavior is normal. Judgment and thought content normal.  PHQ2/9: Depression screen [facility] 2/9 11/30/2017 08/06/2016 03/19/2016 12/26/2015 12/26/2015  Decreased Interest 1 0 3 1 0  Down, Depressed, Hopeless 1 1 2 2  0  PHQ - 2 Score 2 1 5 3  0  Altered sleeping 3 - 3 3 -  Tired, decreased energy 3 - 0 0 -  Change in appetite 3 - 0 0 -  Feeling bad or failure about yourself  0 - 1 3 -  Trouble concentrating 1 - 0 3 -  Moving slowly or fidgety/restless 1 - 2 2 -  Suicidal thoughts 0 - 0 2 -  PHQ-9 Score 13 - 11 16 -  Difficult doing work/chores Somewhat difficult - Very difficult Extremely dIfficult -     Fall Risk: Fall Risk  11/30/2017 05/27/2017 01/28/2017 08/06/2016 12/26/2015  Falls in the past year? Yes Yes No Yes Yes  Number falls in past yr: 2 or more 2 or more - 2 or more 2 or more  Injury with Fall? No Yes - Yes No  Comment - - - Fell down the steps and hit his head on cement -  Risk for fall due to : - History of fall(s);Impaired vision - Impaired balance/gait Impaired vision  Follow up - - - - Falls prevention discussed     Functional Status Survey: Is the patient deaf or have difficulty hearing?: Yes Does the patient have difficulty seeing, even when wearing glasses/contacts?: Yes Does the patient have difficulty concentrating, remembering, or making  decisions?: Yes Does the patient have difficulty walking or climbing stairs?: Yes Does the patient have difficulty dressing or bathing?: Yes Does the patient have difficulty doing errands alone such as visiting a doctor's office or shopping?: Yes   Assessment & Plan  1. Severe episode of recurrent major depressive disorder, without psychotic features (HCC)  phq 9 is higher, on medication, sleeping all day, losing weight   - citalopram (CELEXA) 20 MG tablet; TAKE 1 TABLET(20 MG) BY MOUTH DAILY  Dispense: 90 tablet; Refill: 1  2. Hypertension, benign  - COMPLETE METABOLIC PANEL WITH GFR - CBC with Differential/Platelet - losartan (COZAAR) 50 MG tablet; Take 1 tablet (50 mg total) by mouth daily.  Dispense: 90 tablet; Refill: 1  3. Need for immunization against influenza  - Flu vaccine HIGH DOSE PF (Fluzone High dose)  4. Dementia, vascular, with depression (HCC)   5. Thoracic aortic atherosclerosis (HCC)  Off statin therapy based on morbidities   6. Legally blind in right eye, as defined in [place]   7. Moderate protein-calorie malnutrition (HCC)  - TSH - mirtazapine (REMERON) 15 MG tablet; Take 1 tablet (15 mg total) by mouth at bedtime.  Dispense: 90 tablet; Refill: 1 Discussed differential diagnosis with wife and daughter and not interested in a lot of testing  8. Leg cramps  - Vitamin B12 - Magnesium  9. Anemia, unspecified type  - Magnesium - Iron, TIBC and Ferritin Panel

## 2017-12-01 LAB — COMPLETE METABOLIC PANEL WITH GFR
AG Ratio: 1.3 (calc) (ref 1.0–2.5)
ALBUMIN MSPROF: 4.2 g/dL (ref 3.6–5.1)
ALT: 7 U/L — ABNORMAL LOW (ref 9–46)
AST: 14 U/L (ref 10–35)
Alkaline phosphatase (APISO): 66 U/L (ref 40–115)
BILIRUBIN TOTAL: 0.7 mg/dL (ref 0.2–1.2)
BUN: 21 mg/dL (ref 7–25)
CHLORIDE: 103 mmol/L (ref 98–110)
CO2: 30 mmol/L (ref 20–32)
Calcium: 9.7 mg/dL (ref 8.6–10.3)
Creat: 0.86 mg/dL (ref 0.70–1.18)
GFR, EST AFRICAN AMERICAN: 96 mL/min/{1.73_m2} (ref >=60)
GFR, Est Non African American: 82 mL/min/{1.73_m2} (ref >=60)
GLUCOSE: 81 mg/dL (ref 65–99)
Globulin: 3.3 g/dL (calc) (ref 1.9–3.7)
Potassium: 4.6 mmol/L (ref 3.5–5.3)
Sodium: 141 mmol/L (ref 135–146)
TOTAL PROTEIN: 7.5 g/dL (ref 6.1–8.1)

## 2017-12-01 LAB — CBC WITH DIFFERENTIAL/PLATELET
BASOS PCT: 0.8 %
Basophils Absolute: 30 cells/uL (ref 0–200)
Eosinophils Absolute: 38 cells/uL (ref 15–500)
Eosinophils Relative: 1 %
HCT: 39.7 % (ref 38.5–50.0)
Hemoglobin: 13.2 g/dL (ref 13.2–17.1)
Lymphs Abs: 1626 cells/uL (ref 850–3900)
MCH: 29.5 pg (ref 27.0–33.0)
MCHC: 33.2 g/dL (ref 32.0–36.0)
MCV: 88.8 fL (ref 80.0–100.0)
MONOS PCT: 8.6 %
MPV: 11.3 fL (ref 7.5–12.5)
NEUTROS PCT: 46.8 %
Neutro Abs: 1778 cells/uL (ref 1500–7800)
PLATELETS: 191 10*3/uL (ref 140–400)
RBC: 4.47 10*6/uL (ref 4.20–5.80)
RDW: 11 % (ref 11.0–15.0)
TOTAL LYMPHOCYTE: 42.8 %
WBC: 3.8 10*3/uL (ref 3.8–10.8)
WBCMIX: 327 {cells}/uL (ref 200–950)

## 2017-12-01 LAB — IRON,TIBC AND FERRITIN PANEL
%SAT: 36 % (ref 20–48)
FERRITIN: 262 ng/mL (ref 24–380)
IRON: 96 ug/dL (ref 50–180)
TIBC: 264 ug/dL (ref 250–425)

## 2017-12-01 LAB — TSH: TSH: 1.26 mIU/L (ref 0.40–4.50)

## 2017-12-01 LAB — MAGNESIUM: Magnesium: 2.2 mg/dL (ref 1.5–2.5)

## 2017-12-01 LAB — VITAMIN B12: Vitamin B-12: 651 pg/mL (ref 200–1100)

## 2017-12-02 ENCOUNTER — Ambulatory Visit: Payer: Medicare HMO | Admitting: Family Medicine

## 2017-12-30 DIAGNOSIS — R69 Illness, unspecified: Secondary | ICD-10-CM | POA: Diagnosis not present

## 2017-12-30 DIAGNOSIS — R309 Painful micturition, unspecified: Secondary | ICD-10-CM | POA: Diagnosis not present

## 2018-02-23 ENCOUNTER — Telehealth: Payer: Self-pay | Admitting: Family Medicine

## 2018-02-23 NOTE — Telephone Encounter (Signed)
Pt's wife called wanting to know if the patient should be taking the losartan.  Wife stated that the provider told her that the patient did not need to take it any more.  Per last office visit, the patient should be taking the losartan. Wife voiced understanding.

## 2018-06-01 ENCOUNTER — Other Ambulatory Visit: Payer: Self-pay

## 2018-06-01 ENCOUNTER — Encounter: Payer: Self-pay | Admitting: Family Medicine

## 2018-06-01 ENCOUNTER — Ambulatory Visit (INDEPENDENT_AMBULATORY_CARE_PROVIDER_SITE_OTHER): Payer: Medicare HMO | Admitting: Family Medicine

## 2018-06-01 VITALS — Wt 123.0 lb

## 2018-06-01 DIAGNOSIS — F320 Major depressive disorder, single episode, mild: Secondary | ICD-10-CM | POA: Diagnosis not present

## 2018-06-01 DIAGNOSIS — F0391 Unspecified dementia with behavioral disturbance: Secondary | ICD-10-CM

## 2018-06-01 DIAGNOSIS — E440 Moderate protein-calorie malnutrition: Secondary | ICD-10-CM | POA: Diagnosis not present

## 2018-06-01 DIAGNOSIS — H548 Legal blindness, as defined in USA: Secondary | ICD-10-CM

## 2018-06-01 DIAGNOSIS — I10 Essential (primary) hypertension: Secondary | ICD-10-CM

## 2018-06-01 MED ORDER — MIRTAZAPINE 15 MG PO TABS: 15.0000 mg | ORAL_TABLET | Freq: Every day | ORAL | 1 refills | Status: DC

## 2018-06-01 MED ORDER — CITALOPRAM HYDROBROMIDE 20 MG PO TABS: ORAL_TABLET | ORAL | 1 refills | Status: DC

## 2018-06-01 MED ORDER — LOSARTAN POTASSIUM 50 MG PO TABS: 50.0000 mg | ORAL_TABLET | Freq: Every day | ORAL | 1 refills | Status: DC

## 2018-06-01 NOTE — Progress Notes (Signed)
Name: Sean Phelps   MRN: [id number]    DOB: 1940/05/20   Date:06/01/2018       Progress Note  Subjective  Chief Complaint  Chief Complaint  Patient presents with  . Follow-up    I connected with@ on 06/01/18 at  9:00 AM EDT by telephone and verified that I am speaking with the correct person using two identifiers.  I discussed the limitations, risks, security and privacy concerns of performing an evaluation and management service by telephone and the availability of in person appointments. Staff also discussed with the patient that there may be a patient responsible charge related to this service. Patient Location: home Provider Location: [facility] Additional Individuals present: wife   HPI  HTN: he is taking medication daily for bp,  no chest pain, no dizzinessor palpitation.  No side effects of medication. Unable to check bp at home   Hyperlipidemia: it was decided on his last visit that because of his quality of life it was best to stop simvastatin and we will not longer check cholesterol levels. Unchanged  Dementia: he is forgetful, misplaced things inside the house, including money but able to find it later on. over the past  13  months he has developedhallucinations ( hearing and seeing things), also has paranoid behavior, episodes not as frequent now  . He has been on Aricept since 2015, we gave him Namenda in June but wife was not able to afford medications. Brain MRI showed microvascular disease. Seeing neurologist, Dr [name]  and is on lower dose of Aricept also on Depakote  Malnutrition:he had lost 10 lbs prior to previous visit, but gained it back with Remeron, appetite is good now, we will recheck labs after COVID-19 , no longer skipping meals.    Depression: he has a long history of depression, but worseafterSummer 2017 , legally blind of right eye and vision has decreased, He has been unable to work since August 2017. He is now gaining  weight, seems more cheerful.    Patient Active Problem List   Diagnosis Date Noted  . Moderate dementia, with behavioral disturbance (HCC) 01/20/2017  . Thoracic aortic atherosclerosis (HCC) 07/20/2016  . Corneal edema of right eye 05/19/2016  . Pseudophakia of right eye 04/09/2016  . Status post cataract extraction and insertion of intraocular lens of left eye 04/09/2016  . Primary open angle glaucoma of right eye, severe stage 08/10/2015  . Status post eye surgery 08/10/2015  . Recurrent major depression-severe (HCC) 07/13/2015  . Hypertension, benign 07/13/2015  . Hyperlipidemia 07/13/2015  . Cataract 07/13/2015  . Abnormal brain MRI 07/13/2015  . Legally blind in right eye, as defined in [place] 07/13/2015  . Anemia of chronic disease 07/13/2015  . Dementia, vascular, with depression (HCC) 09/05/2014  . Benign enlargement of prostate 03/22/2009    Past Surgical History:  Procedure Laterality Date  . CATARACT EXTRACTION Right   . EYE SURGERY      Family History  Problem Relation Age of Onset  . Leukemia Father   . Emphysema Father   . Alcohol abuse Mother     Social History   Socioeconomic History  . Marital status: Married    Spouse name: Not on file  . Number of children: Not on file  . Years of education: Not on file  . Highest education level: Not on file  Occupational History  . Not on file  Social Needs  . Financial resource strain: Not on file  . Food  insecurity:    Worry: Not on file    Inability: Not on file  . Transportation needs:    Medical: Not on file    Non-medical: Not on file  Tobacco Use  . Smoking status: Former Smoker    Years: 25.00    Types: Cigarettes    Last attempt to quit: 12/25/1965    Years since quitting: 52.4  . Smokeless tobacco: Never Used  Substance and Sexual Activity  . Alcohol use: No    Alcohol/week: 0.0 standard drinks  . Drug use: No  . Sexual activity: Never  Lifestyle  . Physical activity:    Days per week:  Not on file    Minutes per session: Not on file  . Stress: Not on file  Relationships  . Social connections:    Talks on phone: Not on file    Gets together: Not on file    Attends religious service: Not on file    Active member of club or organization: Not on file    Attends meetings of clubs or organizations: Not on file    Relationship status: Not on file  . Intimate partner violence:    Fear of current or ex partner: Not on file    Emotionally abused: Not on file    Physically abused: Not on file    Forced sexual activity: Not on file  Other Topics Concern  . Not on file  Social History Narrative   Married   Unemployed since eye problems Summer 2017     Current Outpatient Medications:  .  aspirin 81 MG tablet, Take 81 mg by mouth daily., Disp: , Rfl:  .  citalopram (CELEXA) 20 MG tablet, TAKE 1 TABLET(20 MG) BY MOUTH DAILY, Disp: 90 tablet, Rfl: 1 .  divalproex (DEPAKOTE) 125 MG DR tablet, Take 1 tablet by mouth every evening., Disp: , Rfl:  .  donepezil (ARICEPT) 5 MG tablet, Take 1 tablet (5 mg total) by mouth at bedtime., Disp: 90 tablet, Rfl: 0 .  losartan (COZAAR) 50 MG tablet, Take 1 tablet (50 mg total) by mouth daily., Disp: 90 tablet, Rfl: 1 .  mirtazapine (REMERON) 15 MG tablet, Take 1 tablet (15 mg total) by mouth at bedtime., Disp: 90 tablet, Rfl: 1 .  timolol (TIMOPTIC) 0.5 % ophthalmic solution, Place 1 drop into the left eye 2 (two) times daily. , Disp: , Rfl:   Allergies  Allergen Reactions  . Brinzolamide-Brimonidine Other (See Comments)    Red and itchy    I personally reviewed active problem list, medication list, allergies, family history, social history with the patient/caregiver today.   ROS  Ten systems reviewed and is negative except as mentioned in HPI  Objective  Virtual encounter, vitals not obtained.  There is no height or weight on file to calculate BMI.  Physical Exam  Awake, cooperative, he asked his wife and they were on  speaker phone  PHQ2/9: [facility] 2/[facility] 06/01/2018 11/30/2017 08/06/2016 03/19/2016 12/26/2015  Decreased Interest 1 1 0 3 1  Down, Depressed, Hopeless 1 1 1 2 2   PHQ - 2 Score 2 2 1 5 3   Altered sleeping 0 3 - 3 3  Tired, decreased energy 0 3 - 0 0  Change in appetite 0 3 - 0 0  Feeling bad or failure about yourself  0 0 - 1 3  Trouble concentrating 0 1 - 0 3  Moving slowly or fidgety/restless 0 1 - 2 2  Suicidal thoughts 0  0 - 0 2  PHQ-9 Score 2 13 - 11 16  Difficult doing work/chores Not difficult at all Somewhat difficult - Very difficult Extremely dIfficult   PHQ-2/9 Result is positive.    Fall Risk: Fall Risk  11/30/2017 05/27/2017 01/28/2017 08/06/2016 12/26/2015  Falls in the past year? Yes Yes No Yes Yes  Number falls in past yr: 2 or more 2 or more - 2 or more 2 or more  Injury with Fall? No Yes - Yes No  Comment - - - Fell down the steps and hit his head on cement -  Risk for fall due to : - History of fall(s);Impaired vision - Impaired balance/gait Impaired vision  Follow up - - - - Falls prevention discussed     Assessment & Plan  1.Mild episode of major depression  (HCC)  - citalopram (CELEXA) 20 MG tablet; TAKE 1 TABLET(20 MG) BY MOUTH DAILY  Dispense: 90 tablet; Refill: 1  2. Hypertension, benign  - losartan (COZAAR) 50 MG tablet; Take 1 tablet (50 mg total) by mouth daily.  Dispense: 90 tablet; Refill: 1  3. Moderate protein-calorie malnutrition (HCC)  - mirtazapine (REMERON) 15 MG tablet; Take 1 tablet (15 mg total) by mouth at bedtime.  Dispense: 90 tablet; Refill: 1  I discussed the assessment and treatment plan with the patient. The patient was provided an opportunity to ask questions and all were answered. The patient agreed with the plan and demonstrated an understanding of the instructions.   The patient was advised to call back or seek an in-person evaluation if the symptoms worsen or if the condition fails to improve as anticipated.  I provided  15 minutes of non-face-to-face time during this encounter.  Ruel Favors, MD

## 2018-06-11 ENCOUNTER — Other Ambulatory Visit: Payer: Self-pay | Admitting: Family Medicine

## 2018-06-11 DIAGNOSIS — E440 Moderate protein-calorie malnutrition: Secondary | ICD-10-CM

## 2018-06-21 ENCOUNTER — Ambulatory Visit: Payer: Self-pay | Admitting: *Deleted

## 2018-06-21 NOTE — Telephone Encounter (Signed)
Summary: med concerns    Wife called - she is concerned about all the meds that he is on. They are keeping him drowsy. He is faing asleep a lot.  Please call [phone]     Patient's wife is concerned about patient's medications. She states he is drowsy and sleeping all the time. She states he has been sleepy for some time- but all the medications he takes have the side effect of sleeping. Patient has not started any new medications recently- wife is concerned that he sleeps a lot.  Wife would like PCP to review medications and see if there are alternatives that may not make patient so sleepy. Wife states patient sleeps until- late morning- eats breakfast then sits in recliner and falls asleep.Patient may walk to the mailbox or occasionally go outside- otherwise he is sleeping. She is concerned about his lack of activity.  Wife states her husband is not aware of how drowsy and sleepy he is- he has dementia.  Reason for Disposition . Caller has NON-URGENT medication question about med that PCP prescribed and triager unable to answer question  Answer Assessment - Initial Assessment Questions 1. SYMPTOMS: "Do you have any symptoms?"     Wife feels that her husband is sleeping too much  Protocols used: MEDICATION QUESTION CALL-A-AH

## 2018-06-22 NOTE — Telephone Encounter (Signed)
Patient called.  Patient aware.  

## 2018-09-18 ENCOUNTER — Other Ambulatory Visit: Payer: Self-pay | Admitting: Family Medicine

## 2018-09-18 DIAGNOSIS — E440 Moderate protein-calorie malnutrition: Secondary | ICD-10-CM

## 2018-12-01 ENCOUNTER — Encounter: Payer: Self-pay | Admitting: Family Medicine

## 2018-12-01 ENCOUNTER — Other Ambulatory Visit: Payer: Self-pay

## 2018-12-01 ENCOUNTER — Ambulatory Visit (INDEPENDENT_AMBULATORY_CARE_PROVIDER_SITE_OTHER): Payer: Medicare HMO | Admitting: Family Medicine

## 2018-12-01 VITALS — BP 130/62 | HR 71 | Temp 97.5°F | Resp 16 | Ht 61.0 in | Wt 124.5 lb

## 2018-12-01 DIAGNOSIS — F329 Major depressive disorder, single episode, unspecified: Secondary | ICD-10-CM

## 2018-12-01 DIAGNOSIS — H548 Legal blindness, as defined in USA: Secondary | ICD-10-CM

## 2018-12-01 DIAGNOSIS — R400 Somnolence: Secondary | ICD-10-CM

## 2018-12-01 DIAGNOSIS — F0150 Vascular dementia without behavioral disturbance: Secondary | ICD-10-CM | POA: Diagnosis not present

## 2018-12-01 DIAGNOSIS — I10 Essential (primary) hypertension: Secondary | ICD-10-CM

## 2018-12-01 DIAGNOSIS — F0153 Vascular dementia, unspecified severity, with mood disturbance: Secondary | ICD-10-CM

## 2018-12-01 DIAGNOSIS — I700 Atherosclerosis of aorta: Secondary | ICD-10-CM | POA: Diagnosis not present

## 2018-12-01 DIAGNOSIS — Z23 Encounter for immunization: Secondary | ICD-10-CM

## 2018-12-01 DIAGNOSIS — E7800 Pure hypercholesterolemia, unspecified: Secondary | ICD-10-CM

## 2018-12-01 NOTE — Progress Notes (Signed)
Name: RAFI KENNETH   MRN: [id number]    DOB: 06/15/1940   Date:12/01/2018       Progress Note  Subjective  Chief Complaint  Chief Complaint  Patient presents with  . Medication Refill  . Depression  . Dementia  . Hypertension  . Legally blind in right eye    HPI  HTN: he is taking medication daily for bp, no chest pain, no dizzinessor palpitation.No side effects of medication. BP is at goal today  Hyperlipidemia:it was decided with wife and patient in 2019 t that because of his quality of life it was best to stop simvastatin and we will not longer check cholesterol levels.   Dementia: he is forgetful, misplaced things inside the house, including money but not recently, unable to learn new things. since 2019 he developedhallucinations ( hearing and seeing things), paranoid behavior not present recently.He has been on Aricept since 2015, we gave him Namenda in June 2019  but wife was not able to afford medications. Brain MRI showed microvascular disease. Seeing neurologist, Dr Melrose Nakayama  and is on lower dose of Ariceptalso on Depakote for behavior and is doing well  Malnutrition: he has gained 12 lbs since last year, doing well on Remeron, wife states good appetite.   Depression: he has a long history of depression, but worseafterSummer 2017 , legally blind of right eye and vision has decreased, He has been unable to work since August 2017. He is taking citalopram during the day, wife states he sleeps all day and sleeps at night. He denies suicidal thoughts or ideation, we will change time of citalopram . He goes outside with wife to feed the dogs  Atherosclerosis of aorta: not on statin by choice, not on aspirin   Patient Active Problem List   Diagnosis Date Noted  . Moderate dementia, with behavioral disturbance ([place]) 01/20/2017  . Thoracic aortic atherosclerosis ([place]) 07/20/2016  . Corneal edema of right eye 05/19/2016  . Pseudophakia of right eye 04/09/2016  .  Status post cataract extraction and insertion of intraocular lens of left eye 04/09/2016  . Primary open angle glaucoma of right eye, severe stage 08/10/2015  . Status post eye surgery 08/10/2015  . Recurrent major depression-severe ([place]) 07/13/2015  . Hypertension, benign 07/13/2015  . Hyperlipidemia 07/13/2015  . Cataract 07/13/2015  . Abnormal brain MRI 07/13/2015  . Legally blind in right eye, as defined in [place] 07/13/2015  . Dementia, vascular, with depression ([place]) 09/05/2014  . Benign enlargement of prostate 03/22/2009    Past Surgical History:  Procedure Laterality Date  . CATARACT EXTRACTION Right   . EYE SURGERY      Family History  Problem Relation Age of Onset  . Leukemia Father   . Emphysema Father   . Alcohol abuse Mother     Social History   Socioeconomic History  . Marital status: Married    Spouse name: [name]   . Number of children: 4  . Years of education: Not on file  . Highest education level: Not on file  Occupational History  . Not on file  Social Needs  . Financial resource strain: Hard  . Food insecurity    Worry: Sometimes true    Inability: Never true  . Transportation needs    Medical: No    Non-medical: No  Tobacco Use  . Smoking status: Former Smoker    Years: 25.00    Types: Cigarettes    Quit date: 12/25/1965    Years since quitting: 52.9  .  Smokeless tobacco: Never Used  Substance and Sexual Activity  . Alcohol use: No    Alcohol/week: 0.0 standard drinks  . Drug use: No  . Sexual activity: Never  Lifestyle  . Physical activity    Days per week: 0 days    Minutes per session: 0 min  . Stress: Not at all  Relationships  . Social connections    Talks on phone: More than three times a week    Gets together: Twice a week    Attends religious service: More than 4 times per year    Active member of club or organization: No    Attends meetings of clubs or organizations: Never    Relationship status: Married  . Intimate  partner violence    Fear of current or ex partner: No    Emotionally abused: No    Physically abused: No    Forced sexual activity: No  Other Topics Concern  . Not on file  Social History Narrative   Married   Unemployed since eye problems Summer 2017     Current Outpatient Medications:  .  aspirin 81 MG tablet, Take 81 mg by mouth daily., Disp: , Rfl:  .  divalproex (DEPAKOTE) 125 MG DR tablet, Take 1 tablet by mouth every evening., Disp: , Rfl:  .  donepezil (ARICEPT) 10 MG tablet, Take 10 mg by mouth at bedtime., Disp: , Rfl:  .  losartan (COZAAR) 50 MG tablet, Take 1 tablet (50 mg total) by mouth daily., Disp: 90 tablet, Rfl: 1 .  mirtazapine (REMERON) 15 MG tablet, TAKE 1 TABLET BY MOUTH AT BEDTIME, Disp: 30 tablet, Rfl: 0 .  citalopram (CELEXA) 20 MG tablet, TAKE 1 TABLET(20 MG) BY MOUTH DAILY, Disp: 90 tablet, Rfl: 1 .  timolol (TIMOPTIC) 0.5 % ophthalmic solution, Place 1 drop into the left eye 2 (two) times daily. , Disp: , Rfl:   Allergies  Allergen Reactions  . Brinzolamide-Brimonidine Other (See Comments)    Red and itchy    I personally reviewed active problem list, medication list, allergies, family history, social history, health maintenance with the patient/caregiver today.   ROS  Constitutional: Negative for fever, positive for  weight change.  Respiratory: Negative for cough and shortness of breath.   Cardiovascular: Negative for chest pain or palpitations.  Gastrointestinal: Negative for abdominal pain, no bowel changes.  Musculoskeletal: Negative for gait problem or joint swelling.  Skin: Negative for rash.  Neurological: Negative for dizziness or headache.  No other specific complaints in a complete review of systems (except as listed in HPI above).  Objective  Vitals:   12/01/18 0908  BP: 130/62  Pulse: 71  Resp: 16  Temp: (!) 97.5 F (36.4 C)  TempSrc: Temporal  SpO2: 97%  Weight: 124 lb 8 oz (56.5 kg)  Height: 5\' 1"  (1.549 m)    Body  mass index is 23.52 kg/m.  Physical Exam  Constitutional: Patient appears well-developed and well-nourished.No distress.  HEENT: head atraumatic, normocephalic Cardiovascular: Normal rate, regular rhythm and normal heart sounds.  No murmur heard. No BLE edema. Pulmonary/Chest: Effort normal and breath sounds normal. No respiratory distress. Abdominal: Soft.  There is no tenderness. Psychiatric: Somnolent, wife states he is like that all the time, able to answer questions    PHQ2/9: [facility] 2[facility] 12/01/2018 06/01/2018 11/30/2017 08/06/2016 03/19/2016  Decreased Interest 2 1 1  0 3  Down, Depressed, Hopeless 1 1 1 1 2   PHQ - 2 Score 3 2 2  1  5  Altered sleeping 3 0 3 - 3  Tired, decreased energy 0 0 3 - 0  Change in appetite 0 0 3 - 0  Feeling bad or failure about yourself  1 0 0 - 1  Trouble concentrating 2 0 1 - 0  Moving slowly or fidgety/restless 1 0 1 - 2  Suicidal thoughts 0 0 0 - 0  PHQ-9 Score 10 2 13  - 11  Difficult doing work/chores Somewhat difficult Not difficult at all Somewhat difficult - Very difficult    phq 9 is positive   Fall Risk: Fall Risk  12/01/2018 11/30/2017 05/27/2017 01/28/2017 08/06/2016  Falls in the past year? 1 Yes Yes No Yes  Number falls in past yr: 1 2 or more 2 or more - 2 or more  Injury with Fall? 0 No Yes - Yes  Comment - - - - Fell down the steps and hit his head on cement  Risk for fall due to : Impaired vision;Impaired balance/gait - History of fall(s);Impaired vision - Impaired balance/gait  Follow up - - - - -    Functional Status Survey: Is the patient deaf or have difficulty hearing?: Yes Does the patient have difficulty seeing, even when wearing glasses/contacts?: Yes Does the patient have difficulty concentrating, remembering, or making decisions?: Yes Does the patient have difficulty walking or climbing stairs?: Yes Does the patient have difficulty dressing or bathing?: No Does the patient have difficulty doing errands alone  such as visiting a doctor's office or shopping?: Yes   Assessment & Plan  1. Thoracic aortic atherosclerosis (HCC)  Off statin therapy because of co-morbidities   2. Hypertension, benign  - COMPLETE METABOLIC PANEL WITH GFR - CBC with Differential/Platelet  3. Need for immunization against influenza  - Flu Vaccine QUAD High Dose(Fluad)  4. Dementia, vascular, with depression (HCC)  Progressing,  on medication   5. Pure hypercholesterolemia  Not on statin therapy  6. Legally blind in right eye, as defined in 08/08/2016  Likely the main cause of depression   7. Somnolence  Advised wife to change time of medication to pm, since he is sleepy during the day, likely from being alone and bored, but we will try to see if slightly more alert

## 2018-12-02 LAB — CBC WITH DIFFERENTIAL/PLATELET
Absolute Monocytes: 430 cells/uL (ref 200–950)
Basophils Absolute: 30 cells/uL (ref 0–200)
Basophils Relative: 0.7 %
Eosinophils Absolute: 60 cells/uL (ref 15–500)
Eosinophils Relative: 1.4 %
HCT: 40.1 % (ref 38.5–50.0)
Hemoglobin: 13.1 g/dL — ABNORMAL LOW (ref 13.2–17.1)
Lymphs Abs: 1527 cells/uL (ref 850–3900)
MCH: 29.6 pg (ref 27.0–33.0)
MCHC: 32.7 g/dL (ref 32.0–36.0)
MCV: 90.7 fL (ref 80.0–100.0)
MPV: 12 fL (ref 7.5–12.5)
Monocytes Relative: 10 %
Neutro Abs: 2253 cells/uL (ref 1500–7800)
Neutrophils Relative %: 52.4 %
Platelets: 170 10*3/uL (ref 140–400)
RBC: 4.42 10*6/uL (ref 4.20–5.80)
RDW: 11.2 % (ref 11.0–15.0)
Total Lymphocyte: 35.5 %
WBC: 4.3 10*3/uL (ref 3.8–10.8)

## 2018-12-02 LAB — COMPLETE METABOLIC PANEL WITH GFR
AG Ratio: 1.2 (calc) (ref 1.0–2.5)
ALT: 8 U/L — ABNORMAL LOW (ref 9–46)
AST: 15 U/L (ref 10–35)
Albumin: 4.1 g/dL (ref 3.6–5.1)
Alkaline phosphatase (APISO): 64 U/L (ref 35–144)
BUN: 14 mg/dL (ref 7–25)
CO2: 29 mmol/L (ref 20–32)
Calcium: 9.6 mg/dL (ref 8.6–10.3)
Chloride: 104 mmol/L (ref 98–110)
Creat: 0.84 mg/dL (ref 0.70–1.11)
GFR, Est African American: 96 mL/min/{1.73_m2} (ref >=60)
GFR, Est Non African American: 83 mL/min/{1.73_m2} (ref >=60)
Globulin: 3.3 g/dL (calc) (ref 1.9–3.7)
Glucose, Bld: 90 mg/dL (ref 65–99)
Potassium: 4.4 mmol/L (ref 3.5–5.3)
Sodium: 140 mmol/L (ref 135–146)
Total Bilirubin: 0.8 mg/dL (ref 0.2–1.2)
Total Protein: 7.4 g/dL (ref 6.1–8.1)

## 2019-03-15 ENCOUNTER — Other Ambulatory Visit: Payer: Self-pay | Admitting: Family Medicine

## 2019-03-15 DIAGNOSIS — I10 Essential (primary) hypertension: Secondary | ICD-10-CM

## 2019-05-16 ENCOUNTER — Other Ambulatory Visit: Payer: Self-pay | Admitting: Family Medicine

## 2019-05-16 DIAGNOSIS — E440 Moderate protein-calorie malnutrition: Secondary | ICD-10-CM

## 2019-05-26 ENCOUNTER — Ambulatory Visit: Payer: Medicare HMO

## 2019-06-02 ENCOUNTER — Encounter: Payer: Self-pay | Admitting: Family Medicine

## 2019-06-02 ENCOUNTER — Ambulatory Visit (INDEPENDENT_AMBULATORY_CARE_PROVIDER_SITE_OTHER): Payer: Medicare HMO | Admitting: Family Medicine

## 2019-06-02 ENCOUNTER — Other Ambulatory Visit: Payer: Self-pay

## 2019-06-02 ENCOUNTER — Ambulatory Visit (INDEPENDENT_AMBULATORY_CARE_PROVIDER_SITE_OTHER): Payer: Medicare HMO

## 2019-06-02 VITALS — BP 122/84 | HR 65 | Temp 97.3°F | Resp 16 | Ht 61.0 in | Wt 122.5 lb

## 2019-06-02 VITALS — BP 122/84 | HR 65 | Temp 97.3°F | Resp 16 | Ht 61.0 in | Wt 122.8 lb

## 2019-06-02 DIAGNOSIS — E7800 Pure hypercholesterolemia, unspecified: Secondary | ICD-10-CM

## 2019-06-02 DIAGNOSIS — Z0000 Encounter for general adult medical examination without abnormal findings: Secondary | ICD-10-CM | POA: Diagnosis not present

## 2019-06-02 DIAGNOSIS — E440 Moderate protein-calorie malnutrition: Secondary | ICD-10-CM

## 2019-06-02 DIAGNOSIS — F0150 Vascular dementia without behavioral disturbance: Secondary | ICD-10-CM

## 2019-06-02 DIAGNOSIS — H548 Legal blindness, as defined in USA: Secondary | ICD-10-CM

## 2019-06-02 DIAGNOSIS — I10 Essential (primary) hypertension: Secondary | ICD-10-CM

## 2019-06-02 DIAGNOSIS — F0153 Vascular dementia, unspecified severity, with mood disturbance: Secondary | ICD-10-CM

## 2019-06-02 DIAGNOSIS — D638 Anemia in other chronic diseases classified elsewhere: Secondary | ICD-10-CM

## 2019-06-02 DIAGNOSIS — F331 Major depressive disorder, recurrent, moderate: Secondary | ICD-10-CM

## 2019-06-02 DIAGNOSIS — F0391 Unspecified dementia with behavioral disturbance: Secondary | ICD-10-CM

## 2019-06-02 DIAGNOSIS — I700 Atherosclerosis of aorta: Secondary | ICD-10-CM | POA: Diagnosis not present

## 2019-06-02 DIAGNOSIS — F329 Major depressive disorder, single episode, unspecified: Secondary | ICD-10-CM

## 2019-06-02 MED ORDER — MIRTAZAPINE 15 MG PO TABS: 15.0000 mg | ORAL_TABLET | Freq: Every day | ORAL | 0 refills | Status: DC

## 2019-06-02 MED ORDER — CITALOPRAM HYDROBROMIDE 20 MG PO TABS: ORAL_TABLET | ORAL | 1 refills | Status: DC

## 2019-06-02 MED ORDER — LOSARTAN POTASSIUM 50 MG PO TABS: 50.0000 mg | ORAL_TABLET | Freq: Every day | ORAL | 1 refills | Status: DC

## 2019-06-02 NOTE — Progress Notes (Signed)
Name: Sean Phelps   MRN: [id number]    DOB: [birth year]   Date:06/02/2019       Progress Note  Subjective  Chief Complaint  Chief Complaint  Patient presents with  . Medication Refill    Wife states his hearing has gotten worst  . Depression  . Hyperlipidemia  . Hypertension    Denies any symptoms   . Dementia    The wife states his dementia has been worst  . Malnutrition    Has lost 2 pounds-sometimes he doesn't want to eat    HPI  HTN: he is taking medication daily for bp, no chest pain, no dizzinessor palpitation.No side effects of medication. BP has been well controlled   Hyperlipidemia:it was decided with wife and patient in 2019 t that because of his quality of life it was best to stop simvastatin and we will not longer check cholesterol levels. Unchanged   Dementia: he is forgetful, misplaced things inside the house. He developed   developedhallucinations ( hearing and seeing things) in 2019 , paranoid behavior not present recently.He has been on Aricept since 2015, we gave him Namenda in June 2019  but wife was not able to afford medications. Brain MRI showed microvascular disease. Seeing neurologist, Dr Karlene Einstein is on lower dose of Ariceptalso on Depakote for behavior and is stable at this time  Malnutrition: he had lost a lot of weight, but gained it back with Remeron, and now weight has been stable for one year. Wife states she reminds him to eat, and he is doing okay   Depression: he has a long history of depression, but worseafterSummer 2017 , legally blind of right eye and vision has decreased, He has been unable to work since August 2017.He is taking citalopram during the day, wife states he sleeps all day and sleeps at night. He denies suicidal thoughts or ideation, we will change time of citalopram . He goes outside with wife to feed the dogs. Phq 9 is higher today, not sure if he is taking Citalopram based on pharmacy refills. A lot of his  symptoms could be secondary to hearing and vision loss   Atherosclerosis of aorta: not on statin by choice, not on aspirin . Unchanged  Patient Active Problem List   Diagnosis Date Noted  . Moderate dementia, with behavioral disturbance (HCC) 01/20/2017  . Thoracic aortic atherosclerosis (HCC) 07/20/2016  . Corneal edema of right eye 05/19/2016  . Pseudophakia of right eye 04/09/2016  . Status post cataract extraction and insertion of intraocular lens of left eye 04/09/2016  . Primary open angle glaucoma of right eye, severe stage 08/10/2015  . Status post eye surgery 08/10/2015  . Recurrent major depression-severe (HCC) 07/13/2015  . Hypertension, benign 07/13/2015  . Hyperlipidemia 07/13/2015  . Cataract 07/13/2015  . Abnormal brain MRI 07/13/2015  . Legally blind in right eye, as defined in [place] 07/13/2015  . Dementia, vascular, with depression (HCC) 09/05/2014  . Benign enlargement of prostate 03/22/2009    Past Surgical History:  Procedure Laterality Date  . CATARACT EXTRACTION Right   . EYE SURGERY      Family History  Problem Relation Age of Onset  . Leukemia Father   . Emphysema Father   . Alcohol abuse Mother     Social History   Tobacco Use  . Smoking status: Former Smoker    Years: 25.00    Types: Cigarettes    Quit date: 12/25/1965    Years since quitting: 53.4  .  Smokeless tobacco: Never Used  Substance Use Topics  . Alcohol use: No    Alcohol/week: 0.0 standard drinks     Current Outpatient Medications:  .  aspirin 81 MG tablet, Take 81 mg by mouth daily., Disp: , Rfl:  .  citalopram (CELEXA) 20 MG tablet, TAKE 1 TABLET(20 MG) BY MOUTH DAILY, Disp: 90 tablet, Rfl: 1 .  divalproex (DEPAKOTE) 125 MG DR tablet, Take 1 tablet by mouth every evening., Disp: , Rfl:  .  donepezil (ARICEPT) 10 MG tablet, Take 10 mg by mouth at bedtime., Disp: , Rfl:  .  losartan (COZAAR) 50 MG tablet, Take 1 tablet by mouth once daily, Disp: 90 tablet, Rfl: 0 .   mirtazapine (REMERON) 15 MG tablet, TAKE 1 TABLET BY MOUTH AT BEDTIME, Disp: 90 tablet, Rfl: 0 .  timolol (TIMOPTIC) 0.5 % ophthalmic solution, Place 1 drop into the left eye 2 (two) times daily. , Disp: , Rfl:   Allergies  Allergen Reactions  . Brinzolamide-Brimonidine Other (See Comments)    Red and itchy    I personally reviewed active problem list, medication list, allergies, family history, social history, health maintenance with the patient/caregiver today.   ROS  Constitutional: Negative for fever or weight change.  Respiratory: Negative for cough and shortness of breath.   Cardiovascular: Negative for chest pain or palpitations.  Gastrointestinal: Negative for abdominal pain, no bowel changes.  Musculoskeletal: Negative for gait problem ( needs help just because of vision) but no joint swelling.  Skin: Negative for rash.  Neurological: Negative for dizziness or headache.  No other specific complaints in a complete review of systems (except as listed in HPI above).  Objective  Vitals:   06/02/19 0901  BP: 122/84  Pulse: 65  Resp: 16  Temp: (!) 97.3 F (36.3 C)  TempSrc: Temporal  SpO2: 96%  Weight: 122 lb 8 oz (55.6 kg)  Height: 5\' 1"  (1.549 m)    Body mass index is 23.15 kg/m.  Physical Exam  Constitutional: Patient appears well-developed and well-nourished. No distress.  HEENT: head atraumatic, normocephalic, pupils non reactive, left pupil is dilated and irregular, right eye has a white film over cornea  Cardiovascular: Normal rate, regular rhythm and normal heart sounds.  No murmur heard. No BLE edema. Pulmonary/Chest: Effort normal and breath sounds normal. No respiratory distress. Abdominal: Soft.  There is no tenderness. Psychiatric: Patient did not talk much, he cannot see or hear well. Wife did most of the talk   PHQ2/9: Depression screen [facility] 2/9 12/01/2018 06/01/2018 11/30/2017 08/06/2016 03/19/2016  Decreased Interest 2 1 1  0 3  Down, Depressed,  Hopeless 1 1 1 1 2   PHQ - 2 Score 3 2 2 1 5   Altered sleeping 3 0 3 - 3  Tired, decreased energy 0 0 3 - 0  Change in appetite 0 0 3 - 0  Feeling bad or failure about yourself  1 0 0 - 1  Trouble concentrating 2 0 1 - 0  Moving slowly or fidgety/restless 1 0 1 - 2  Suicidal thoughts 0 0 0 - 0  PHQ-9 Score 10 2 13  - 11  Difficult doing work/chores Somewhat difficult Not difficult at all Somewhat difficult - Very difficult    phq 9 is positive   Fall Risk: Fall Risk  06/02/2019 12/01/2018 11/30/2017 05/27/2017 01/28/2017  Falls in the past year? 0 1 Yes Yes No  Number falls in past yr: 0 1 2 or more 2 or more -  Injury  with Fall? 0 0 No Yes -  Comment - - - - -  Risk for fall due to : - Impaired vision;Impaired balance/gait - History of fall(s);Impaired vision -  Follow up - - - - -     Functional Status Survey: Is the patient deaf or have difficulty hearing?: Yes Does the patient have difficulty seeing, even when wearing glasses/contacts?: Yes Does the patient have difficulty concentrating, remembering, or making decisions?: Yes Does the patient have difficulty walking or climbing stairs?: Yes Does the patient have difficulty dressing or bathing?: No Does the patient have difficulty doing errands alone such as visiting a doctor's office or shopping?: Yes    Assessment & Plan  1. Thoracic aortic atherosclerosis (HCC)  Discussed stain therapy, but because of multiple other problems , wife and patient decided not to start it   2. Hypertension, benign  - COMPLETE METABOLIC PANEL WITH GFR - CBC with Differential/Platelet - losartan (COZAAR) 50 MG tablet; Take 1 tablet (50 mg total) by mouth daily.  Dispense: 90 tablet; Refill: 1  3. Pure hypercholesterolemia  We will no longer check level  4. Dementia, vascular, with depression ([place])  Under the care of Dr. Melrose Nakayama  5. Legally blind in right eye, as defined in [place]  Seeing eye doctor  6. Dementia with behavioral  disturbance, unspecified dementia type ([place])  Seeing Dr. Melrose Nakayama   7. Anemia of chronic disease  - CBC with Differential/Platelet  8. Moderate recurrent major depression (HCC) - citalopram (CELEXA) 20 MG tablet; TAKE 1 TABLET(20 MG) BY MOUTH DAILY  Dispense: 90 tablet; Refill: 1   9. Moderate protein-calorie malnutrition (HCC)  - mirtazapine (REMERON) 15 MG tablet; Take 1 tablet (15 mg total) by mouth at bedtime.  Dispense: 90 tablet; Refill: 0

## 2019-06-02 NOTE — Patient Instructions (Addendum)
Sean Phelps , Thank you for taking time to come for your Medicare Wellness Visit. I appreciate your ongoing commitment to your health goals. Please review the following plan we discussed and let me know if I can assist you in the future.   Screening Please follow up in one year for your Medicare Annual Wellness visit.  recommendations/referrals: Colonoscopy: no longer required Recommended yearly ophthalmology/optometry visit for glaucoma screening and checkup Recommended yearly dental visit for hygiene and checkup  Vaccinations: Influenza vaccine: done 12/01/18 Pneumococcal vaccine: done 07/13/15 Tdap vaccine: done 07/13/15 Shingles vaccine: Shingrix discussed. Please contact your pharmacy for coverage information.  Covid-19: done 04/23/19 & 05/21/19  Conditions/risks identified: Recommend increasing physical activity  Next appointment: Please follow up in one year for your Medicare Annual Wellness visit.    Preventive Care 81 Years and Older, Male Preventive care refers to lifestyle choices and visits with your health care provider that can promote health and wellness. What does preventive care include?  A yearly physical exam. This is also called an annual well check.  Dental exams once or twice a year.  Routine eye exams. Ask your health care provider how often you should have your eyes checked.  Personal lifestyle choices, including:  Daily care of your teeth and gums.  Regular physical activity.  Eating a healthy diet.  Avoiding tobacco and drug use.  Limiting alcohol use.  Practicing safe sex.  Taking low doses of aspirin every day.  Taking vitamin and mineral supplements as recommended by your health care provider. What happens during an annual well check? The services and screenings done by your health care provider during your annual well check will depend on your age, overall health, lifestyle risk factors, and family history of disease. Counseling  Your health  care provider may ask you questions about your:  Alcohol use.  Tobacco use.  Drug use.  Emotional well-being.  Home and relationship well-being.  Sexual activity.  Eating habits.  History of falls.  Memory and ability to understand (cognition).  Work and work Astronomer. Screening  You may have the following tests or measurements:  Height, weight, and BMI.  Blood pressure.  Lipid and cholesterol levels. These may be checked every 5 years, or more frequently if you are over 38 years old.  Skin check.  Lung cancer screening. You may have this screening every year starting at age 81 if you have a 30-pack-year history of smoking and currently smoke or have quit within the past 15 years.  Fecal occult blood test (FOBT) of the stool. You may have this test every year starting at age 5.  Flexible sigmoidoscopy or colonoscopy. You may have a sigmoidoscopy every 5 years or a colonoscopy every 10 years starting at age 71.  Prostate cancer screening. Recommendations will vary depending on your family history and other risks.  Hepatitis C blood test.  Hepatitis B blood test.  Sexually transmitted disease (STD) testing.  Diabetes screening. This is done by checking your blood sugar (glucose) after you have not eaten for a while (fasting). You may have this done every 1-3 years.  Abdominal aortic aneurysm (AAA) screening. You may need this if you are a current or former smoker.  Osteoporosis. You may be screened starting at age 89 if you are at high risk. Talk with your health care provider about your test results, treatment options, and if necessary, the need for more tests. Vaccines  Your health care provider may recommend certain vaccines, such as:  Influenza  vaccine. This is recommended every year.  Tetanus, diphtheria, and acellular pertussis (Tdap, Td) vaccine. You may need a Td booster every 10 years.  Zoster vaccine. You may need this after age  23.  Pneumococcal 13-valent conjugate (PCV13) vaccine. One dose is recommended after age 1.  Pneumococcal polysaccharide (PPSV23) vaccine. One dose is recommended after age 81. Talk to your health care provider about which screenings and vaccines you need and how often you need them. This information is not intended to replace advice given to you by your health care provider. Make sure you discuss any questions you have with your health care provider. Document Released: 03/09/2015 Document Revised: 10/31/2015 Document Reviewed: 12/12/2014 Elsevier Interactive Patient Education  2017 [place] Prevention in the Home Falls can cause injuries. They can happen to people of all ages. There are many things you can do to make your home safe and to help prevent falls. What can I do on the outside of my home?  Regularly fix the edges of walkways and driveways and fix any cracks.  Remove anything that might make you trip as you walk through a door, such as a raised step or threshold.  Trim any bushes or trees on the path to your home.  Use bright outdoor lighting.  Clear any walking paths of anything that might make someone trip, such as rocks or tools.  Regularly check to see if handrails are loose or broken. Make sure that both sides of any steps have handrails.  Any raised decks and porches should have guardrails on the edges.  Have any leaves, snow, or ice cleared regularly.  Use sand or salt on walking paths during winter.  Clean up any spills in your garage right away. This includes oil or grease spills. What can I do in the bathroom?  Use night lights.  Install grab bars by the toilet and in the tub and shower. Do not use towel bars as grab bars.  Use non-skid mats or decals in the tub or shower.  If you need to sit down in the shower, use a plastic, non-slip stool.  Keep the floor dry. Clean up any water that spills on the floor as soon as it happens.  Remove  soap buildup in the tub or shower regularly.  Attach bath mats securely with double-sided non-slip rug tape.  Do not have throw rugs and other things on the floor that can make you trip. What can I do in the bedroom?  Use night lights.  Make sure that you have a light by your bed that is easy to reach.  Do not use any sheets or blankets that are too big for your bed. They should not hang down onto the floor.  Have a firm chair that has side arms. You can use this for support while you get dressed.  Do not have throw rugs and other things on the floor that can make you trip. What can I do in the kitchen?  Clean up any spills right away.  Avoid walking on wet floors.  Keep items that you use a lot in easy-to-reach places.  If you need to reach something above you, use a strong step stool that has a grab bar.  Keep electrical cords out of the way.  Do not use floor polish or wax that makes floors slippery. If you must use wax, use non-skid floor wax.  Do not have throw rugs and other things on the floor that can  make you trip. What can I do with my stairs?  Do not leave any items on the stairs.  Make sure that there are handrails on both sides of the stairs and use them. Fix handrails that are broken or loose. Make sure that handrails are as long as the stairways.  Check any carpeting to make sure that it is firmly attached to the stairs. Fix any carpet that is loose or worn.  Avoid having throw rugs at the top or bottom of the stairs. If you do have throw rugs, attach them to the floor with carpet tape.  Make sure that you have a light switch at the top of the stairs and the bottom of the stairs. If you do not have them, ask someone to add them for you. What else can I do to help prevent falls?  Wear shoes that:  Do not have high heels.  Have rubber bottoms.  Are comfortable and fit you well.  Are closed at the toe. Do not wear sandals.  If you use a  stepladder:  Make sure that it is fully opened. Do not climb a closed stepladder.  Make sure that both sides of the stepladder are locked into place.  Ask someone to hold it for you, if possible.  Clearly mark and make sure that you can see:  Any grab bars or handrails.  First and last steps.  Where the edge of each step is.  Use tools that help you move around (mobility aids) if they are needed. These include:  Canes.  Walkers.  Scooters.  Crutches.  Turn on the lights when you go into a dark area. Replace any light bulbs as soon as they burn out.  Set up your furniture so you have a clear path. Avoid moving your furniture around.  If any of your floors are uneven, fix them.  If there are any pets around you, be aware of where they are.  Review your medicines with your doctor. Some medicines can make you feel dizzy. This can increase your chance of falling. Ask your doctor what other things that you can do to help prevent falls. This information is not intended to replace advice given to you by your health care provider. Make sure you discuss any questions you have with your health care provider. Document Released: 12/07/2008 Document Revised: 07/19/2015 Document Reviewed: 03/17/2014 Elsevier Interactive Patient Education  2017 [employer].

## 2019-06-02 NOTE — Progress Notes (Signed)
Subjective:   Sean Phelps is a 79 y.o. male who presents for Medicare Annual/Subsequent preventive examination.  Review of Systems:   Cardiac Risk Factors include: advanced age (>38men, >37 women);dyslipidemia     Objective:    Vitals: BP 122/84   Pulse 65   Temp (!) 97.3 F (36.3 C) (Temporal)   Resp 16   Ht 5\' 1"  (1.549 m)   Wt 122 lb 12.8 oz (55.7 kg)   SpO2 96%   BMI 23.20 kg/m   Body mass index is 23.2 kg/m.  Advanced Directives 06/02/2019 12/20/2016 11/12/2016 07/20/2016 03/19/2016 12/26/2015 07/13/2015  Does Patient Have a Medical Advance Directive? No No Yes No No No No  Would patient like information on creating a medical advance directive? No - Patient declined No - Patient declined - - - No - patient declined information No - patient declined information    Tobacco Social History   Tobacco Use  Smoking Status Former Smoker  . Years: 25.00  . Types: Cigarettes  . Quit date: 12/25/1965  . Years since quitting: 53.4  Smokeless Tobacco Never Used     Counseling given: Not Answered   Clinical Intake:  Pre-visit preparation completed: Yes  Pain : No/denies pain     BMI - recorded: 23.15 Nutritional Status: BMI of 19-24  Normal Nutritional Risks: None Diabetes: No  How often do you need to have someone help you when you read instructions, pamphlets, or other written materials from your doctor or pharmacy?: 1 - Never  Interpreter Needed?: No  Information entered by :: Sean Marker LPN  Past Medical History:  Diagnosis Date  . Asthma   . Brachial neuritis   . Brachial neuritis   . Elevated cholesterol 07/20/14  . Hypertension   . Lumbago   . 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 CATARACT EXTRACTION Right   . EYE SURGERY     Family History  Problem Relation Age of Onset  . Leukemia Father   . Emphysema Father   . Alcohol abuse Mother    Social  History   Socioeconomic History  . Marital status: Married    Spouse name: Sean Phelps   . Number of children: 4  . Years of education: Not on file  . Highest education level: Not on file  Occupational History  . Not on file  Tobacco Use  . Smoking status: Former Smoker    Years: 25.00    Types: Cigarettes    Quit date: 12/25/1965    Years since quitting: 53.4  . Smokeless tobacco: Never Used  Substance and Sexual Activity  . Alcohol use: No    Alcohol/week: 0.0 standard drinks  . Drug use: No  . Sexual activity: Never  Other Topics Concern  . Not on file  Social History Narrative   Married   Unemployed since eye problems Summer 2017   Social Determinants of Health   Financial Resource Strain: High Risk  . Difficulty of Paying Living Expenses: Hard  Food Insecurity: No Food Insecurity  . Worried About Charity fundraiser in the Last Year: Never true  . Ran Out of Food in the Last Year: Never true  Transportation Needs: No Transportation Needs  . Lack of Transportation (Medical): No  . Lack of Transportation (Non-Medical): No  Physical Activity: Inactive  . Days of Exercise per Week: 0 days  . Minutes of Exercise per Session:  0 min  Stress: Stress Concern Present  . Feeling of Stress : Rather much  Social Connections: Slightly Isolated  . Frequency of Communication with Friends and Family: More than three times a week  . Frequency of Social Gatherings with Friends and Family: Twice a week  . Attends Religious Services: More than 4 times per year  . Active Member of Clubs or Organizations: No  . Attends Banker Meetings: Never  . Marital Status: Married    Outpatient Encounter Medications as of 06/02/2019  Medication Sig  . aspirin 81 MG tablet Take 81 mg by mouth daily.  . divalproex (DEPAKOTE) 125 MG DR tablet Take 1 tablet by mouth every evening.  . donepezil (ARICEPT) 10 MG tablet Take 10 mg by mouth at bedtime.  . timolol (TIMOPTIC) 0.5 % ophthalmic  solution Place 1 drop into the left eye 2 (two) times daily.   . [DISCONTINUED] citalopram (CELEXA) 20 MG tablet TAKE 1 TABLET(20 MG) BY MOUTH DAILY  . [DISCONTINUED] losartan (COZAAR) 50 MG tablet Take 1 tablet by mouth once daily  . [DISCONTINUED] mirtazapine (REMERON) 15 MG tablet TAKE 1 TABLET BY MOUTH AT BEDTIME   No facility-administered encounter medications on file as of 06/02/2019.    Activities of Daily Living In your present state of health, do you have any difficulty performing the following activities: 06/02/2019 06/02/2019  Hearing? Y Y  Comment declines hearing aids -  Vision? Y Y  Difficulty concentrating or making decisions? Sean Phelps  Walking or climbing stairs? Y Y  Dressing or bathing? N N  Doing errands, shopping? Sean Phelps  Preparing Food and eating ? N -  Using the Toilet? N -  In the past six months, have you accidently leaked urine? N -  Do you have problems with loss of bowel control? N -  Managing your Medications? N -  Managing your Finances? N -  Housekeeping or managing your Housekeeping? N -  Some recent data might be hidden    Patient Care Team: Sean Cory, MD as PCP - General (Family Medicine) Sean Phelps, Sean Guardian, MD as Referring Physician (Ophthalmology) Sean Li, MD as Consulting Physician (Ophthalmology)   Assessment:   This is a routine wellness examination for Sean Phelps.  Exercise Activities and Dietary recommendations Current Exercise Habits: The patient does not participate in regular exercise at present, Exercise limited by: None identified  Goals   None     Fall Risk Fall Risk  06/02/2019 06/02/2019 12/01/2018 11/30/2017 05/27/2017  Falls in the past year? 0 0 1 Yes Yes  Number falls in past yr: 0 0 1 2 or more 2 or more  Injury with Fall? 0 0 0 No Yes  Comment - - - - -  Risk for fall due to : Impaired vision - Impaired vision;Impaired balance/gait - History of fall(s);Impaired vision  Follow up Falls prevention discussed - - - -   FALL RISK  PREVENTION PERTAINING TO THE HOME:  Any stairs in or around the home? Yes  If so, do they handrails? Yes   Home free of loose throw rugs in walkways, pet beds, electrical cords, etc? Yes  Adequate lighting in your home to reduce risk of falls? Yes   ASSISTIVE DEVICES UTILIZED TO PREVENT FALLS:  Life alert? No Use of a cane, walker or w/c? No  Grab bars in the bathroom? No  Shower chair or bench in shower? No  Elevated toilet seat or a handicapped toilet? No   DME ORDERS:  DME order needed?  No   TIMED UP AND GO:  Was the test performed? Yes .  Length of time to ambulate 10 feet: 6 sec.   GAIT:  Appearance of gait: Gait stead-fast and without the use of an assistive device.    Education: Fall risk prevention has been discussed.  Intervention(s) required? No   Depression Screen PHQ 2/9 Scores 06/02/2019 12/01/2018 06/01/2018 11/30/2017  PHQ - 2 Score 2 3 2 2   PHQ- 9 Score 10 10 2 13   Exception Documentation - Other- indicate reason in comment box - -  Not completed - Dementia-wife states he sleeps all the time - -    Cognitive Function MMSE - Mini Mental State Exam 08/06/2016  Orientation to time 0  Orientation to Place 4  Registration 3  Attention/ Calculation 0  Recall 3  Language- name 2 objects 2  Language- repeat 1  Language- follow 3 step command 3  Language- read & follow direction 1  Write a sentence 0  Copy design 0  Total score 17   MMSE deferred for 2021 AWV due to being followed by neurology for dementia.        Immunization History  Administered Date(s) Administered  . Fluad Quad(high Dose 65+) 12/01/2018  . Influenza, High Dose Seasonal PF 12/26/2015, 01/28/2017, 11/30/2017  . Influenza-Unspecified 11/01/2013  . Moderna SARS-COVID-2 Vaccination 04/23/2019, 05/21/2019  . Pneumococcal Conjugate-13 08/31/2013  . Pneumococcal Polysaccharide-23 07/13/2015  . Tdap 07/13/2015    Qualifies for Shingles Vaccine? Yes . Due for Shingrix. Education has  been provided regarding the importance of this vaccine. Pt has been advised to call insurance company to determine out of pocket expense. Advised may also receive vaccine at local pharmacy or Health Dept. Verbalized acceptance and understanding.  Tdap: Up to date  Flu Vaccine: Up to date  Pneumococcal Vaccine: Up to date   Screening Tests Health Maintenance  Topic Date Due  . INFLUENZA VACCINE  09/25/2019  . TETANUS/TDAP  07/12/2025  . PNA vac Low Risk Adult  Completed   Cancer Screenings:  Colorectal Screening: No longer required.   Lung Cancer Screening: (Low Dose CT Chest recommended if Age 7-80 years, 30 pack-year currently smoking OR have quit w/in 15years.) does not qualify.    Additional Screening:  Hepatitis C Screening: no longer required   Vision Screening: Recommended annual ophthalmology exams for early detection of glaucoma and other disorders of the eye. Is the patient up to date with their annual eye exam?  No  Who is the provider or what is the name of the office in which the pt attends annual eye exams? Not established If pt is not established with a provider, would they like to be referred to a provider to establish care? No .   Dental Screening: Recommended annual dental exams for proper oral hygiene  Community Resource Referral:  CRR required this visit?  No       Plan:    I have personally reviewed and addressed the Medicare Annual Wellness questionnaire and have noted the following in the patient's chart:  A. Medical and social history B. Use of alcohol, tobacco or illicit drugs  C. Current medications and supplements D. Functional ability and status E.  Nutritional status F.  Physical activity G. Advance directives H. List of other physicians I.  Hospitalizations, surgeries, and ER visits in previous 12 months J.  Vitals K. Screenings such as hearing and vision if needed, cognitive and depression L. Referrals and appointments  In  addition, I have reviewed and discussed with patient certain preventive protocols, quality metrics, and best practice recommendations. A written personalized care plan for preventive services as well as general preventive health recommendations were provided to patient.   Signed,  [name], LPN Nurse Health Advisor   Nurse Notes: pt accompanied to visit today by his wife Talbert Forest

## 2019-06-03 ENCOUNTER — Telehealth: Payer: Self-pay

## 2019-06-03 NOTE — Telephone Encounter (Signed)
Copied from CRM [id number]. Topic: General - Other >> Jun 03, 2019  9:09 AM Dalphine Handing A wrote: Patients spouse would like a callback to confirm which medication patient is to discontinue. Please advise   I called his wife and went through the chart I did not see any medication that you wanted him to d/c.

## 2019-06-04 LAB — CBC WITH DIFFERENTIAL/PLATELET
Absolute Monocytes: 679 cells/uL (ref 200–950)
Basophils Absolute: 58 cells/uL (ref 0–200)
Basophils Relative: 0.8 %
Eosinophils Absolute: 88 cells/uL (ref 15–500)
Eosinophils Relative: 1.2 %
HCT: 37.4 % — ABNORMAL LOW (ref 38.5–50.0)
Hemoglobin: 12.2 g/dL — ABNORMAL LOW (ref 13.2–17.1)
Lymphs Abs: 1482 cells/uL (ref 850–3900)
MCH: 29.9 pg (ref 27.0–33.0)
MCHC: 32.6 g/dL (ref 32.0–36.0)
MCV: 91.7 fL (ref 80.0–100.0)
MPV: 11 fL (ref 7.5–12.5)
Monocytes Relative: 9.3 %
Neutro Abs: 4993 cells/uL (ref 1500–7800)
Neutrophils Relative %: 68.4 %
Platelets: 186 10*3/uL (ref 140–400)
RBC: 4.08 10*6/uL — ABNORMAL LOW (ref 4.20–5.80)
RDW: 11 % (ref 11.0–15.0)
Total Lymphocyte: 20.3 %
WBC: 7.3 10*3/uL (ref 3.8–10.8)

## 2019-06-04 LAB — TEST AUTHORIZATION

## 2019-06-04 LAB — COMPLETE METABOLIC PANEL WITH GFR
AG Ratio: 1.2 (calc) (ref 1.0–2.5)
ALT: 10 U/L (ref 9–46)
AST: 15 U/L (ref 10–35)
Albumin: 3.8 g/dL (ref 3.6–5.1)
Alkaline phosphatase (APISO): 60 U/L (ref 35–144)
BUN: 17 mg/dL (ref 7–25)
CO2: 30 mmol/L (ref 20–32)
Calcium: 9.3 mg/dL (ref 8.6–10.3)
Chloride: 105 mmol/L (ref 98–110)
Creat: 0.75 mg/dL (ref 0.70–1.11)
GFR, Est African American: 100 mL/min/{1.73_m2} (ref >=60)
GFR, Est Non African American: 86 mL/min/{1.73_m2} (ref >=60)
Globulin: 3.3 g/dL (calc) (ref 1.9–3.7)
Glucose, Bld: 86 mg/dL (ref 65–99)
Potassium: 3.8 mmol/L (ref 3.5–5.3)
Sodium: 140 mmol/L (ref 135–146)
Total Bilirubin: 0.7 mg/dL (ref 0.2–1.2)
Total Protein: 7.1 g/dL (ref 6.1–8.1)

## 2019-06-04 LAB — IRON,TIBC AND FERRITIN PANEL
%SAT: 37 % (calc) (ref 20–48)
Ferritin: 201 ng/mL (ref 24–380)
Iron: 90 ug/dL (ref 50–180)
TIBC: 241 mcg/dL (calc) — ABNORMAL LOW (ref 250–425)

## 2019-06-06 ENCOUNTER — Ambulatory Visit: Payer: Medicare HMO | Admitting: Family Medicine

## 2019-09-14 ENCOUNTER — Other Ambulatory Visit: Payer: Self-pay

## 2019-09-14 ENCOUNTER — Ambulatory Visit (INDEPENDENT_AMBULATORY_CARE_PROVIDER_SITE_OTHER): Payer: Medicare HMO | Admitting: Family Medicine

## 2019-09-14 ENCOUNTER — Encounter: Payer: Self-pay | Admitting: Family Medicine

## 2019-09-14 VITALS — BP 122/80 | HR 74 | Temp 98.3°F | Resp 14 | Ht 61.0 in | Wt 115.6 lb

## 2019-09-14 DIAGNOSIS — R21 Rash and other nonspecific skin eruption: Secondary | ICD-10-CM | POA: Diagnosis not present

## 2019-09-14 MED ORDER — MUPIROCIN 2 % EX OINT: 1.0000 "application " | TOPICAL_OINTMENT | Freq: Two times a day (BID) | CUTANEOUS | 0 refills | Status: AC

## 2019-09-14 MED ORDER — PREDNISONE 10 MG (21) PO TBPK: ORAL_TABLET | ORAL | 0 refills | Status: DC

## 2019-09-14 MED ORDER — HYDROXYZINE HCL 10 MG PO TABS: 10.0000 mg | ORAL_TABLET | Freq: Three times a day (TID) | ORAL | 0 refills | Status: DC | PRN

## 2019-09-14 NOTE — Patient Instructions (Signed)
For now put antibiotic ointment on open skin areas Cover with bandage or some kind of non-stick gauze - just to prevent any chance of infection  Take the hydroxyzine as needed - I would use sparingly for severe itching - may cause sleepiness or make any dementia/confusion worse than his normal.   I sent in prednisone taper - you ONLY need to start this if the rash is spreading and worsening.  Please follow up if there is any worsening of rash or any other symptoms.  I would definitely be concerned if blisters were spreading, if there is surrounding swelling, redness and/or pain developing.    Rash, Adult  A rash is a change in the color of your skin. A rash can also change the way your skin feels. There are many different conditions and factors that can cause a rash. Follow these instructions at home: The goal of treatment is to stop the itching and keep the rash from spreading. Watch for any changes in your symptoms. Let your doctor know about them. Follow these instructions to help with your condition: Medicine Take or apply over-the-counter and prescription medicines only as told by your doctor. These may include medicines:  To treat red or swollen skin (corticosteroid creams).  To treat itching.  To treat an allergy (oral antihistamines).  To treat very bad symptoms (oral corticosteroids).  Skin care  Put cool cloths (compresses) on the affected areas.  Do not scratch or rub your skin.  Avoid covering the rash. Make sure that the rash is exposed to air as much as possible. Managing itching and discomfort  Avoid hot showers or baths. These can make itching worse. A cold shower may help.  Try taking a bath with: ? Epsom salts. You can get these at your local pharmacy or grocery store. Follow the instructions on the package. ? Baking soda. Pour a small amount into the bath as told by your doctor. ? Colloidal oatmeal. You can get this at your local pharmacy or grocery  store. Follow the instructions on the package.  Try putting baking soda paste onto your skin. Stir water into baking soda until it gets like a paste.  Try putting on a lotion that relieves itchiness (calamine lotion).  Keep cool and out of the sun. Sweating and being hot can make itching worse. General instructions   Rest as needed.  Drink enough fluid to keep your pee (urine) pale yellow.  Wear loose-fitting clothing.  Avoid scented soaps, detergents, and perfumes. Use gentle soaps, detergents, perfumes, and other cosmetic products.  Avoid anything that causes your rash. Keep a journal to help track what causes your rash. Write down: ? What you eat. ? What cosmetic products you use. ? What you drink. ? What you wear. This includes jewelry.  Keep all follow-up visits as told by your doctor. This is important.   Contact a doctor if:  You sweat at night.  You lose weight.  You pee (urinate) more than normal.  You pee less than normal, or you notice that your pee is a darker color than normal.  You feel weak.  You throw up (vomit).  Your skin or the whites of your eyes look yellow (jaundice).  Your skin: ? Tingles. ? Is numb.  Your rash: ? Does not go away after a few days. ? Gets worse.  You are: ? More thirsty than normal. ? More tired than normal.  You have: ? New symptoms. ? Pain in your belly (abdomen). ?  A fever. ? Watery poop (diarrhea). Get help right away if:  You have a fever and your symptoms suddenly get worse.  You start to feel mixed up (confused).  You have a very bad headache or a stiff neck.  You have very bad joint pains or stiffness.  You have jerky movements that you cannot control (seizure).  Your rash covers all or most of your body. The rash may or may not be painful.  You have blisters that: ? Are on top of the rash. ? Grow larger. ? Grow together. ? Are painful. ? Are inside your nose or mouth.  You have a rash  that: ? Looks like purple pinprick-sized spots all over your body. ? Has a "bull's eye" or looks like a target. ? Is red and painful, causes your skin to peel, and is not from being in the sun too long. Summary  A rash is a change in the color of your skin. A rash can also change the way your skin feels.  The goal of treatment is to stop the itching and keep the rash from spreading.  Take or apply over-the-counter and prescription medicines only as told by your doctor.  Contact a doctor if you have new symptoms or symptoms that get worse.  Keep all follow-up visits as told by your doctor. This is important. This information is not intended to replace advice given to you by your health care provider. Make sure you discuss any questions you have with your health care provider. Document Revised: 06/04/2018 Document Reviewed: 09/14/2017 Elsevier Patient Education  2020 [employer].

## 2019-09-14 NOTE — Progress Notes (Signed)
Patient ID: Sean Phelps, male    DOB: May 10, 1940, 79 y.o.   MRN: [id number]  PCP: Alba Cory, MD  Chief Complaint  Patient presents with  . Rash    Left leg and foot     Subjective:   Sean Phelps is a 79 y.o. male, presents to clinic with CC of the following:  HPI  Pt presents for spreading rash that is itchy and has some blisters.  Started a few days ago to left foot and leg, has spread a little.  Some areas are looking better, still has some pustules, blisters and flatter areas that look like they are healing and drying up.  Red to purple in color.  Pt reports itching has been gradually improving, and there has been no recent rapid spreading of rash.  They deny any recent illness.  He has been behaving normal for him (baseline MS, behavior, activity, appetite) no fever, chills, URI sx, GI sx.  No known plant exposure, bug bites, med changes, or new soaps/lotions/detergents.  He is here with his wife whom he lives with and his two daughters.  No one else has any similar rash.    Patient Active Problem List   Diagnosis Date Noted  . Moderate dementia, with behavioral disturbance (HCC) 01/20/2017  . Thoracic aortic atherosclerosis (HCC) 07/20/2016  . Corneal edema of right eye 05/19/2016  . Pseudophakia of right eye 04/09/2016  . Status post cataract extraction and insertion of intraocular lens of left eye 04/09/2016  . Primary open angle glaucoma of right eye, severe stage 08/10/2015  . Status post eye surgery 08/10/2015  . Recurrent major depression-severe (HCC) 07/13/2015  . Hypertension, benign 07/13/2015  . Hyperlipidemia 07/13/2015  . Cataract 07/13/2015  . Abnormal brain MRI 07/13/2015  . Legally blind in right eye, as defined in [place] 07/13/2015  . Dementia, vascular, with depression (HCC) 09/05/2014  . Benign enlargement of prostate 03/22/2009      Current Outpatient Medications:  .  aspirin 81 MG tablet, Take 81 mg by mouth daily., Disp: , Rfl:  .   citalopram (CELEXA) 20 MG tablet, TAKE 1 TABLET(20 MG) BY MOUTH DAILY, Disp: 90 tablet, Rfl: 1 .  divalproex (DEPAKOTE) 125 MG DR tablet, Take 1 tablet by mouth every evening., Disp: , Rfl:  .  donepezil (ARICEPT) 10 MG tablet, Take 10 mg by mouth at bedtime., Disp: , Rfl:  .  losartan (COZAAR) 50 MG tablet, Take 1 tablet (50 mg total) by mouth daily., Disp: 90 tablet, Rfl: 1 .  memantine (NAMENDA) 5 MG tablet, Take by mouth., Disp: , Rfl:  .  mirtazapine (REMERON) 15 MG tablet, Take 1 tablet (15 mg total) by mouth at bedtime., Disp: 90 tablet, Rfl: 0 .  timolol (TIMOPTIC) 0.5 % ophthalmic solution, Place 1 drop into the left eye 2 (two) times daily. , Disp: , Rfl:  .  hydrOXYzine (ATARAX/VISTARIL) 10 MG tablet, Take 1 tablet (10 mg total) by mouth 3 (three) times daily as needed for itching (for severe itching/rash)., Disp: 20 tablet, Rfl: 0 .  mupirocin ointment (BACTROBAN) 2 %, Apply 1 application topically 2 (two) times daily for 5 days. To open skin areas, Disp: 22 g, Rfl: 0 .  predniSONE (STERAPRED UNI-PAK 21 TAB) 10 MG (21) TBPK tablet, Take as directed on package.  (60 mg po on day 1, 50 mg po on day 2...), Disp: 21 tablet, Rfl: 0   Allergies  Allergen Reactions  . Brinzolamide-Brimonidine Other (See Comments)  Red and itchy     Social History   Tobacco Use  . Smoking status: Former Smoker    Years: 25.00    Types: Cigarettes    Quit date: 12/25/1965    Years since quitting: 53.7  . Smokeless tobacco: Never Used  Vaping Use  . Vaping Use: Never used  Substance Use Topics  . Alcohol use: No    Alcohol/week: 0.0 standard drinks  . Drug use: No      Chart Review Today: I personally reviewed active problem list, medication list, allergies, family history, social history, health maintenance, notes from last encounter, lab results, imaging with the patient/caregiver today.   Review of Systems 10 Systems reviewed and are negative for acute change except as noted in the  HPI.     Objective:   Vitals:   09/14/19 1501  BP: 122/80  Pulse: 74  Resp: 14  Temp: 98.3 F (36.8 C)  TempSrc: Temporal  SpO2: 98%  Weight: 115 lb 9.6 oz (52.4 kg)  Height: 5\' 1"  (1.549 m)    Body mass index is 21.84 kg/m.  Physical Exam Vitals and nursing note reviewed.  Constitutional:      General: He is not in acute distress.    Appearance: He is not ill-appearing or toxic-appearing.     Comments: Thin elderly male, alert  HENT:     Head: Normocephalic and atraumatic.  Skin:    Findings: Lesion and rash present.          Results for orders placed or performed in visit on 06/02/19  COMPLETE METABOLIC PANEL WITH GFR  Result Value Ref Range   Glucose, Bld 86 65 - 99 mg/dL   BUN 17 7 - 25 mg/dL   Creat 08/02/19 2.97 - 9.89 mg/dL   GFR, Est Non African American 86 > OR = 60 mL/min/1.48m2   GFR, Est African American 100 > OR = 60 mL/min/1.55m2   BUN/Creatinine Ratio NOT APPLICABLE 6 - 22 (calc)   Sodium 140 135 - 146 mmol/L   Potassium 3.8 3.5 - 5.3 mmol/L   Chloride 105 98 - 110 mmol/L   CO2 30 20 - 32 mmol/L   Calcium 9.3 8.6 - 10.3 mg/dL   Total Protein 7.1 6.1 - 8.1 g/dL   Albumin 3.8 3.6 - 5.1 g/dL   Globulin 3.3 1.9 - 3.7 g/dL (calc)   AG Ratio 1.2 1.0 - 2.5 (calc)   Total Bilirubin 0.7 0.2 - 1.2 mg/dL   Alkaline phosphatase (APISO) 60 35 - 144 U/L   AST 15 10 - 35 U/L   ALT 10 9 - 46 U/L  CBC with Differential/Platelet  Result Value Ref Range   WBC 7.3 3.8 - 10.8 Thousand/uL   RBC 4.08 (L) 4.20 - 5.80 Million/uL   Hemoglobin 12.2 (L) 13.2 - 17.1 g/dL   HCT 75m (L) 38 - 50 %   MCV 91.7 80.0 - 100.0 fL   MCH 29.9 27.0 - 33.0 pg   MCHC 32.6 32.0 - 36.0 g/dL   RDW 94.1 74.0 - 81.4 %   Platelets 186 140 - 400 Thousand/uL   MPV 11.0 7.5 - 12.5 fL   Neutro Abs 4,993 1,500 - 7,800 cells/uL   Lymphs Abs 1,482 850 - 3,900 cells/uL   Absolute Monocytes 679 200 - 950 cells/uL   Eosinophils Absolute 88 15 - 500 cells/uL   Basophils Absolute 58 0 - 200  cells/uL   Neutrophils Relative % 68.4 %   Total Lymphocyte 20.3 %  Monocytes Relative 9.3 %   Eosinophils Relative 1.2 %   Basophils Relative 0.8 %  Iron, TIBC and Ferritin Panel  Result Value Ref Range   Iron 90 50 - 180 mcg/dL   TIBC 433 (L) 295 - 188 mcg/dL (calc)   %SAT 37 20 - 48 % (calc)   Ferritin 201 24 - 380 ng/mL  TEST AUTHORIZATION  Result Value Ref Range   TEST NAME: IRON, TIBC AND FERRITIN PANEL    TEST CODE: [id number]    CLIENT CONTACT: DR Carlynn Purl    REPORT ALWAYS MESSAGE SIGNATURE         Assessment & Plan:    1. Rash and nonspecific skin eruption Unclear etiology, has been improving with only abx ointment Ointment to open areas, steroid taper and low dose hydroxyzine if pt has severe itching or is scratching open skin- discussed SE of meds, best to avoid if not needed - mupirocin ointment (BACTROBAN) 2 %; Apply 1 application topically 2 (two) times daily for 5 days. To open skin areas  Dispense: 22 g; Refill: 0 - hydrOXYzine (ATARAX/VISTARIL) 10 MG tablet; Take 1 tablet (10 mg total) by mouth 3 (three) times daily as needed for itching (for severe itching/rash).  Dispense: 20 tablet; Refill: 0 - predniSONE (STERAPRED UNI-PAK 21 TAB) 10 MG (21) TBPK tablet; Take as directed on package.  (60 mg po on day 1, 50 mg po on day 2...)  Dispense: 21 tablet; Refill: 0     Danelle Berry, PA-C 09/14/19 3:40 PM

## 2019-09-23 ENCOUNTER — Encounter: Payer: Self-pay | Admitting: Family Medicine

## 2019-09-23 ENCOUNTER — Ambulatory Visit (INDEPENDENT_AMBULATORY_CARE_PROVIDER_SITE_OTHER): Payer: Medicare HMO | Admitting: Family Medicine

## 2019-09-23 ENCOUNTER — Other Ambulatory Visit: Payer: Self-pay

## 2019-09-23 VITALS — BP 140/68 | HR 68 | Temp 97.7°F | Resp 16 | Ht 61.0 in | Wt 115.4 lb

## 2019-09-23 DIAGNOSIS — Z9181 History of falling: Secondary | ICD-10-CM | POA: Diagnosis not present

## 2019-09-23 DIAGNOSIS — R21 Rash and other nonspecific skin eruption: Secondary | ICD-10-CM | POA: Diagnosis not present

## 2019-09-23 DIAGNOSIS — H548 Legal blindness, as defined in USA: Secondary | ICD-10-CM

## 2019-09-23 DIAGNOSIS — E440 Moderate protein-calorie malnutrition: Secondary | ICD-10-CM | POA: Diagnosis not present

## 2019-09-23 DIAGNOSIS — R5381 Other malaise: Secondary | ICD-10-CM

## 2019-09-23 NOTE — Patient Instructions (Signed)

## 2019-09-23 NOTE — Progress Notes (Signed)
Name: Sean Phelps   MRN: [id number]    DOB: [birth year]   Date:09/23/2019       Progress Note  Subjective  Chief Complaint  Chief Complaint  Patient presents with  . Rash    1 week follow up    HPI  Rash follow up: he developed a rash on left leg and blister on left foot last week,  Danelle Berry advised Neosporin topically and hydroxyzine for itching. He never had fever or chills. The rash has not spread and is drying out, no pain or discomfort. The blister ruptured   Recent Fall: he is very thin/ malnourished, has muscle atrophy, slow gait from deconditioning , he slipped on his bathtub today trying to get out without assistance from his wife. He did not get hurt. Wife and daughter are worried because he is not very active, and sleeps all the time. He is legally blind   Malnutrition: he is very thin, not eating much, not taking protein shakes, discussed high protein diet   Patient Active Problem List   Diagnosis Date Noted  . Moderate dementia, with behavioral disturbance (HCC) 01/20/2017  . Thoracic aortic atherosclerosis (HCC) 07/20/2016  . Corneal edema of right eye 05/19/2016  . Pseudophakia of right eye 04/09/2016  . Status post cataract extraction and insertion of intraocular lens of left eye 04/09/2016  . Primary open angle glaucoma of right eye, severe stage 08/10/2015  . Status post eye surgery 08/10/2015  . Recurrent major depression-severe (HCC) 07/13/2015  . Hypertension, benign 07/13/2015  . Hyperlipidemia 07/13/2015  . Cataract 07/13/2015  . Abnormal brain MRI 07/13/2015  . Legally blind in right eye, as defined in [place] 07/13/2015  . Dementia, vascular, with depression (HCC) 09/05/2014  . Benign enlargement of prostate 03/22/2009    Past Surgical History:  Procedure Laterality Date  . CATARACT EXTRACTION Right   . EYE SURGERY      Family History  Problem Relation Age of Onset  . Leukemia Father   . Emphysema Father   . Alcohol abuse Mother      Social History   Tobacco Use  . Smoking status: Former Smoker    Years: 25.00    Types: Cigarettes    Quit date: 12/25/1965    Years since quitting: 53.7  . Smokeless tobacco: Never Used  Substance Use Topics  . Alcohol use: No    Alcohol/week: 0.0 standard drinks     Current Outpatient Medications:  .  aspirin 81 MG tablet, Take 81 mg by mouth daily., Disp: , Rfl:  .  citalopram (CELEXA) 20 MG tablet, TAKE 1 TABLET(20 MG) BY MOUTH DAILY, Disp: 90 tablet, Rfl: 1 .  divalproex (DEPAKOTE) 125 MG DR tablet, Take 1 tablet by mouth every evening., Disp: , Rfl:  .  donepezil (ARICEPT) 10 MG tablet, Take 10 mg by mouth at bedtime., Disp: , Rfl:  .  hydrOXYzine (ATARAX/VISTARIL) 10 MG tablet, Take 1 tablet (10 mg total) by mouth 3 (three) times daily as needed for itching (for severe itching/rash)., Disp: 20 tablet, Rfl: 0 .  losartan (COZAAR) 50 MG tablet, Take 1 tablet (50 mg total) by mouth daily., Disp: 90 tablet, Rfl: 1 .  memantine (NAMENDA) 5 MG tablet, Take by mouth., Disp: , Rfl:  .  mirtazapine (REMERON) 15 MG tablet, Take 1 tablet (15 mg total) by mouth at bedtime., Disp: 90 tablet, Rfl: 0 .  predniSONE (STERAPRED UNI-PAK 21 TAB) 10 MG (21) TBPK tablet, Take as directed on package.  (  60 mg po on day 1, 50 mg po on day 2...), Disp: 21 tablet, Rfl: 0 .  timolol (TIMOPTIC) 0.5 % ophthalmic solution, Place 1 drop into the left eye 2 (two) times daily. , Disp: , Rfl:   Allergies  Allergen Reactions  . Brinzolamide-Brimonidine Other (See Comments)    Red and itchy    I personally reviewed active problem list, medication list, allergies, family history, social history, health maintenance with the patient/caregiver today.   ROS  Ten systems reviewed and is negative except as mentioned in HPI   Objective  Vitals:   09/23/19 1423 09/23/19 1447  BP: (!) 150/80 (!) 140/68  Pulse: 68   Resp: 16   Temp: 97.7 F (36.5 C)   TempSrc: Temporal   SpO2: 99%   Weight: 115 lb 6.4  oz (52.3 kg)   Height: 5\' 1"  (1.549 m)     Body mass index is 21.8 kg/m.  Physical Exam  Constitutional: Patient appears very frail No distress.  HEENT: head atraumatic, normocephalic,neck supple, white cornea Cardiovascular: Normal rate, regular rhythm and normal heart sounds.  No murmur heard. No BLE edema. Pulmonary/Chest: Effort normal and breath sounds normal. No respiratory distress. Abdominal: Soft.  There is no tenderness. Muscular skeletal: muscle atrophy on legs, palpable bones on back and clavicle Skin: rash on the same area, dry and healing  Psychiatric: Patient has a normal mood and affect. behavior is normal. Judgment and thought content normal.   Fall Risk: Fall Risk  09/23/2019 09/23/2019 09/14/2019 06/02/2019 06/02/2019  Falls in the past year? 1 0 0 0 0  Number falls in past yr: 0 0 0 0 0  Injury with Fall? 0 0 0 0 0  Comment - - - - -  Risk for fall due to : History of fall(s);Impaired vision;Mental status change - Impaired balance/gait;Impaired vision Impaired vision -  Follow up Falls evaluation completed - - Falls prevention discussed -     Functional Status Survey: Is the patient deaf or have difficulty hearing?: Yes Does the patient have difficulty seeing, even when wearing glasses/contacts?: Yes Does the patient have difficulty concentrating, remembering, or making decisions?: Yes Does the patient have difficulty walking or climbing stairs?: Yes Does the patient have difficulty dressing or bathing?: Yes Does the patient have difficulty doing errands alone such as visiting a doctor's office or shopping?: Yes   Assessment & Plan   1. Rash and nonspecific skin eruption   2. Moderate protein-calorie malnutrition (HCC)   3. History of recent fall  - Ambulatory referral to Home Health  4. Legally blind in right eye, as defined in 08/02/2019  - Ambulatory referral to Home Health  5. Physical deconditioning  - Ambulatory referral to Home Health

## 2019-11-10 ENCOUNTER — Telehealth: Payer: Self-pay | Admitting: Family Medicine

## 2019-11-10 NOTE — Telephone Encounter (Signed)
Inetta Fermo, nurse from [facility], called and is requesting to have the pts depression medications, citalopram & mirtazapine,  reviewed. She states that the pts depression symptoms are increasing. Please advise.

## 2019-11-11 NOTE — Telephone Encounter (Signed)
Appt made 11-15-2019

## 2019-11-15 ENCOUNTER — Encounter: Payer: Self-pay | Admitting: Family Medicine

## 2019-11-15 ENCOUNTER — Other Ambulatory Visit: Payer: Self-pay

## 2019-11-15 ENCOUNTER — Telehealth: Payer: Self-pay | Admitting: *Deleted

## 2019-11-15 ENCOUNTER — Ambulatory Visit (INDEPENDENT_AMBULATORY_CARE_PROVIDER_SITE_OTHER): Payer: Medicare HMO | Admitting: Family Medicine

## 2019-11-15 VITALS — BP 130/80 | HR 63 | Temp 97.9°F | Resp 12 | Ht 61.0 in | Wt 113.7 lb

## 2019-11-15 DIAGNOSIS — Z23 Encounter for immunization: Secondary | ICD-10-CM

## 2019-11-15 DIAGNOSIS — F0391 Unspecified dementia with behavioral disturbance: Secondary | ICD-10-CM

## 2019-11-15 DIAGNOSIS — F331 Major depressive disorder, recurrent, moderate: Secondary | ICD-10-CM

## 2019-11-15 DIAGNOSIS — L080 Pyoderma: Secondary | ICD-10-CM

## 2019-11-15 MED ORDER — RISPERIDONE 0.5 MG PO TABS: 0.5000 mg | ORAL_TABLET | Freq: Every day | ORAL | 0 refills | Status: DC

## 2019-11-15 NOTE — Chronic Care Management (AMB) (Signed)
  Chronic Care Management   Note  11/15/2019 Name: ZAMIR STAPLES MRN: [id number] DOB: 11-21-1940  TESLA BOCHICCHIO is a 79 y.o. year old male who is a primary care patient of Steele Sizer, MD. I reached out to Burgess Amor by phone today in response to a referral sent by Mr. Khairi Garman Umphlett's PCP, Steele Sizer, MD.     Mr. Strausbaugh was given information about Chronic Care Management services today including:  1. CCM service includes personalized support from designated clinical staff supervised by his physician, including individualized plan of care and coordination with other care providers 2. 24/7 contact phone numbers for assistance for urgent and routine care needs. 3. Service will only be billed when office clinical staff spend 20 minutes or more in a month to coordinate care. 4. Only one practitioner may furnish and bill the service in a calendar month. 5. The patient may stop CCM services at any time (effective at the end of the month) by phone call to the office staff. 6. The patient will be responsible for cost sharing (co-pay) of up to 20% of the service fee (after annual deductible is met).  Spouse Renaldo Gornick  verbally agreed to assistance and services provided by embedded care coordination/care management team today.  Follow up plan: Telephone appointment with care management team member scheduled for:11/23/2019  [facility]

## 2019-11-15 NOTE — Progress Notes (Addendum)
Name: Sean Phelps   MRN: [id number]    DOB: 1940-10-22   Date:11/15/2019       Progress Note  Subjective  Chief Complaint  Chief Complaint  Patient presents with  . Follow-up    HPI  MDD/Dementia: nurse Inetta Fermo from [facility] home health reached out to [place] last week explained that she noticed he seems to be more depressed and asked to change anti-depressant medication. Wife states he is not eating at times, talks about not wanting to live anymore and wants to stop taking medication. He also has hallucinations visual ( told wife that was water on the floor) also hears things outside and has to go check the front of her house. He has not been getting out of the house. No longer uses public bathrooms and when he gets home he has to rush to use the bathroom. He does not feel hungry and continues to lose weight.  We will stop Remeron and start Risperdal low dose, advised wife to let me know if helps with symptoms    Rash: he continues to have itchy bumps on his body, mostly legs, groin, and abdomen, wife has some on her legs also. They have dogs outside, they have a cream at home but he has not been using it lately and also stopped using his lotion. Discussed referral to dermatologist. Discussed bed bugs . Wife states they will try cream again and let me know if it does not improve - she is not sure of the name / per records seems like bactroban   Patient Active Problem List   Diagnosis Date Noted  . Moderate dementia, with behavioral disturbance (HCC) 01/20/2017  . Thoracic aortic atherosclerosis (HCC) 07/20/2016  . Corneal edema of right eye 05/19/2016  . Pseudophakia of right eye 04/09/2016  . Status post cataract extraction and insertion of intraocular lens of left eye 04/09/2016  . Primary open angle glaucoma of right eye, severe stage 08/10/2015  . Status post eye surgery 08/10/2015  . Recurrent major depression-severe (HCC) 07/13/2015  . Hypertension, benign 07/13/2015  . Hyperlipidemia  07/13/2015  . Cataract 07/13/2015  . Abnormal brain MRI 07/13/2015  . Legally blind in right eye, as defined in [place] 07/13/2015  . Dementia, vascular, with depression (HCC) 09/05/2014  . Benign enlargement of prostate 03/22/2009    Past Surgical History:  Procedure Laterality Date  . CATARACT EXTRACTION Right   . EYE SURGERY      Family History  Problem Relation Age of Onset  . Leukemia Father   . Emphysema Father   . Alcohol abuse Mother     Social History   Tobacco Use  . Smoking status: Former Smoker    Years: 25.00    Types: Cigarettes    Quit date: 12/25/1965    Years since quitting: 53.9  . Smokeless tobacco: Never Used  Substance Use Topics  . Alcohol use: No    Alcohol/week: 0.0 standard drinks     Current Outpatient Medications:  .  aspirin 81 MG tablet, Take 81 mg by mouth daily., Disp: , Rfl:  .  citalopram (CELEXA) 20 MG tablet, TAKE 1 TABLET(20 MG) BY MOUTH DAILY, Disp: 90 tablet, Rfl: 1 .  divalproex (DEPAKOTE) 125 MG DR tablet, Take 1 tablet by mouth every evening., Disp: , Rfl:  .  donepezil (ARICEPT) 10 MG tablet, Take 10 mg by mouth at bedtime., Disp: , Rfl:  .  hydrOXYzine (ATARAX/VISTARIL) 10 MG tablet, Take 1 tablet (10 mg total) by mouth  3 (three) times daily as needed for itching (for severe itching/rash)., Disp: 20 tablet, Rfl: 0 .  losartan (COZAAR) 50 MG tablet, Take 1 tablet (50 mg total) by mouth daily., Disp: 90 tablet, Rfl: 1 .  memantine (NAMENDA) 5 MG tablet, Take by mouth., Disp: , Rfl:  .  timolol (TIMOPTIC) 0.5 % ophthalmic solution, Place 1 drop into the left eye 2 (two) times daily. , Disp: , Rfl:  .  risperiDONE (RISPERDAL) 0.5 MG tablet, Take 1 tablet (0.5 mg total) by mouth at bedtime., Disp: 30 tablet, Rfl: 0  Allergies  Allergen Reactions  . Brinzolamide-Brimonidine Other (See Comments)    Red and itchy    I personally reviewed active problem list, medication list, allergies, family history, social history, health  maintenance with the patient/caregiver today.   ROS  Ten systems reviewed and is negative except as mentioned in HPI    Objective  Vitals:   11/15/19 1422  BP: 130/80  Pulse: 63  Resp: 12  Temp: 97.9 F (36.6 C)  TempSrc: Oral  SpO2: 97%  Weight: 113 lb 11.2 oz (51.6 kg)  Height: 5\' 1"  (1.549 m)    Body mass index is 21.48 kg/m.  Physical Exam  Constitutional: Patient appears well-developed and very thin. No distress.  HEENT: head atraumatic, normocephalic, pupils equal and reactive to light,neck supple Cardiovascular: Normal rate, regular rhythm and normal heart sounds.  No murmur heard. No BLE edema. Pulmonary/Chest: Effort normal and breath sounds normal. No respiratory distress. Abdominal: Soft.  There is no tenderness. Skin: small erythematous nodules or pustules in groups. Mostly on legs some areas are ulcerated likely from scratching  Psychiatric: Patient has a depressed mood.   PHQ2/9: Depression screen [facility] 2/9 11/15/2019 06/02/2019 12/01/2018 06/01/2018 11/30/2017  Decreased Interest 3 1 2 1 1   Down, Depressed, Hopeless 3 1 1 1 1   PHQ - 2 Score 6 2 3 2 2   Altered sleeping - 3 3 0 3  Tired, decreased energy - 1 0 0 3  Change in appetite - 1 0 0 3  Feeling bad or failure about yourself  - 1 1 0 0  Trouble concentrating - 2 2 0 1  Moving slowly or fidgety/restless - 0 1 0 1  Suicidal thoughts - 0 0 0 0  PHQ-9 Score - 10 10 2 13   Difficult doing work/chores - - Somewhat difficult Not difficult at all Somewhat difficult    phq 9 is positive, he did not answer, wife answered most question   Fall Risk: Fall Risk  11/15/2019 09/23/2019 09/23/2019 09/14/2019 06/02/2019  Falls in the past year? 1 1 0 0 0  Number falls in past yr: 1 0 0 0 0  Injury with Fall? 0 0 0 0 0  Comment - - - - -  Risk for fall due to : History of fall(s);Impaired vision History of fall(s);Impaired vision;Mental status change - Impaired balance/gait;Impaired vision Impaired vision  Follow up -  Falls evaluation completed - - Falls prevention discussed    Assessment & Plan  1. Dementia with behavioral disturbance, unspecified dementia type (HCC)  - Ambulatory referral to Chronic Care Management Services - risperiDONE (RISPERDAL) 0.5 MG tablet; Take 1 tablet (0.5 mg total) by mouth at bedtime.  Dispense: 30 tablet; Refill: 0  2. Need for immunization against influenza  - Flu Vaccine QUAD High Dose(Fluad)  3. Moderate recurrent major depression (HCC)  - Ambulatory referral to Chronic Care Management Services - risperiDONE (RISPERDAL) 0.5 MG tablet; Take  1 tablet (0.5 mg total) by mouth at bedtime.  Dispense: 30 tablet; Refill: 0 0  4. Pustular rash  Resume bactroban

## 2019-11-23 ENCOUNTER — Ambulatory Visit: Payer: Medicare HMO | Admitting: *Deleted

## 2019-11-23 DIAGNOSIS — F331 Major depressive disorder, recurrent, moderate: Secondary | ICD-10-CM

## 2019-11-23 DIAGNOSIS — F0391 Unspecified dementia with behavioral disturbance: Secondary | ICD-10-CM

## 2019-11-23 NOTE — Chronic Care Management (AMB) (Signed)
Chronic Care Management    Clinical Social Work Follow Up Note  11/23/2019 Name: Sean Phelps MRN: [id number] DOB: 1940-03-23  Sean Phelps is a 79 y.o. year old male who is a primary care patient of Alba Cory, MD. The CCM team was consulted for assistance with [facility] .   Review of patient status, including review of consultants reports, other relevant assessments, and collaboration with appropriate care team members and the patient's provider was performed as part of comprehensive patient evaluation and provision of chronic care management services.    SDOH (Social Determinants of Health) assessments performed: No    Outpatient Encounter Medications as of 11/23/2019  Medication Sig  . aspirin 81 MG tablet Take 81 mg by mouth daily.  . citalopram (CELEXA) 20 MG tablet TAKE 1 TABLET(20 MG) BY MOUTH DAILY  . divalproex (DEPAKOTE) 125 MG DR tablet Take 1 tablet by mouth every evening.  . donepezil (ARICEPT) 10 MG tablet Take 10 mg by mouth at bedtime.  . hydrOXYzine (ATARAX/VISTARIL) 10 MG tablet Take 1 tablet (10 mg total) by mouth 3 (three) times daily as needed for itching (for severe itching/rash).  Marland Kitchen losartan (COZAAR) 50 MG tablet Take 1 tablet (50 mg total) by mouth daily.  . memantine (NAMENDA) 5 MG tablet Take by mouth.  . risperiDONE (RISPERDAL) 0.5 MG tablet Take 1 tablet (0.5 mg total) by mouth at bedtime.  . timolol (TIMOPTIC) 0.5 % ophthalmic solution Place 1 drop into the left eye 2 (two) times daily.    No facility-administered encounter medications on file as of 11/23/2019.     Goals Addressed              This Visit's Progress   .  Make and Keep All Appointments        Follow Up Date 11/30/2019   - keep a calendar with appointment dates    Why is this important?   Part of staying healthy is seeing the doctor for follow-up care.  If you forget your appointments, there are some things you can do to stay on track.    Notes:     .  per  patient's spouse "I would like him to remain active as much as possible" (pt-stated)        Follow Up Date 11/30/19   - spend time outdoors at least 3 times a week    Why is this important?   Having a long-term illness can be scary.  It can also be stressful for you and your caregiver.  These steps may help.    Notes: Patient's spouse to encouraged daily activity of feeding the dogs and picking up sticks in the back yard    .  per patient's spouse "I would like to consider finding help in the community even though he may not go" (pt-stated)        Follow Up Date 11/30/2019   - begin a notebook of services in my neighborhood or community - follow-up on any referrals for help I am given    Why is this important?   Knowing how and where to find help for yourself or family in your neighborhood and community is an important skill.  You will want to take some steps to learn how.    Notes:  Discussed the possibility of considering an Adult Day Program for patient 1-2x per week        Follow Up Plan: SW will follow up with patient by phone over  the next 7-14 business days   Verna Czech, [place] Clinical Social Worker  [facility] [phone]

## 2019-11-23 NOTE — Patient Instructions (Signed)
Thank you allowing the Chronic Care Management Team to be a part of your care! It was a pleasure speaking with you today!  1. Please call this social worker with any questions or concerns regarding patient's community resource needs CCM (Chronic Care Management) Team   Juanell Fairly RN, BSN Nurse Care Coordinator  [phone]  Blanche Scovell [address], LCSW Clinical Social Worker [phone]  Goals Addressed              This Visit's Progress   .  Make and Keep All Appointments        Follow Up Date 11/30/2019   - keep a calendar with appointment dates    Why is this important?   Part of staying healthy is seeing the doctor for follow-up care.  If you forget your appointments, there are some things you can do to stay on track.    Notes:     .  per patient's spouse "I would like him to remain active as much as possible" (pt-stated)        Follow Up Date 11/30/19   - spend time outdoors at least 3 times a week    Why is this important?   Having a long-term illness can be scary.  It can also be stressful for you and your caregiver.  These steps may help.    Notes: Patient's spouse to encouraged daily activity of feeding the dogs and picking up sticks in the back yard    .  per patient's spouse "I would like to consider finding help in the community even though he may not go" (pt-stated)        Follow Up Date 11/30/2019   - begin a notebook of services in my neighborhood or community - follow-up on any referrals for help I am given    Why is this important?   Knowing how and where to find help for yourself or family in your neighborhood and community is an important skill.  You will want to take some steps to learn how.    Notes:  Discussed the possibility of considering an Adult Day Program for patient 1-2x per week        The patient verbalized understanding of instructions provided today and declined a print copy of patient instruction materials.   Telephone  follow up appointment with care management team member scheduled for: 11/30/19

## 2019-11-30 ENCOUNTER — Telehealth: Payer: Self-pay | Admitting: *Deleted

## 2019-11-30 ENCOUNTER — Telehealth: Payer: Self-pay

## 2019-11-30 NOTE — Telephone Encounter (Signed)
   Chronic Care Management   Unsuccessful Call Note 11/30/2019 Name: EMETERIO BALKE MRN: [id number] DOB: 27-Feb-1940  Patient  is a 79 year old male who sees Dr. Carlynn Purl for primary care. Dr. Carlynn Purl asked the CCM team to consult the patient for [facility].    This social worker was unable to reach patient via telephone today for follow up call. I was not able to leave a HIPAA compliant voicemail asking patient to return my call as the voicemail was full. (unsuccessful outreach #1).   Plan: Will follow-up within 7 business days via telephone.      Verna Czech, LCSW [facility] [phone]

## 2019-12-01 NOTE — Progress Notes (Signed)
Name: Sean Phelps   MRN: [id number]    DOB: Jun 27, 1940   Date:12/02/2019       Progress Note  Subjective  Chief Complaint  Chief Complaint  Patient presents with  . Follow-up  . Memory Loss    wife states nrew med risperidone is making him talk out of head and very confused  . Weight Loss    HPI  HTN: he is taking medication daily for bp, no chest pain, no dizzinessor palpitation.No side effects of medication.BP is towards low end of normal   Hyperlipidemia:it was decidedwith wife and patient in 2019that because of his quality of life it was best to stop simvastatin and we will not longer check cholesterol levels. Unchanged   Dementia: he is forgetful, misplaced things inside the house. He also has hallucinations ( hearing and seeing things) in 2019,paranoia had improved but progressed again during the Summer 2021, wife brought him in and we tried switching from Remeron to Risperdal but she states he is still talking out of his head and getting more sleepy during the day, appetite has also decreased. We will stop Risperdal and resume Remeron. He has been on Aricept since 2015, Namenda was added in 2019 but cost was prohibitive. Brain MRI showed microvascular disease. Seeing neurologist, Dr [name] who has ordered Depakote but not on current medication bag, he has a follow up with Dr. [name] in November. Discussed importance of thinking about long term care facility for him.   Malnutrition:he had lost a lot of weight, but gained it back with Remeron, since we stopped Remeron last month he went down again on his weight and we will resume it   Depression: he has a long history of depression, but worseafterSummer 2017 , legally blind of right eye and vision has decreased, He has been unable to work since August 2017.He is taking citalopram during the day, he is always sleeping, otherwise has some paranoia and hallucinations  He denies suicidal thoughts or ideation. He is not  walking as much lately because of his decrease in eye sight   Atherosclerosis of aorta: not on statin by choice, not on aspirin.   Patient Active Problem List   Diagnosis Date Noted  . Moderate dementia, with behavioral disturbance (HCC) 01/20/2017  . Thoracic aortic atherosclerosis (HCC) 07/20/2016  . Corneal edema of right eye 05/19/2016  . Pseudophakia of right eye 04/09/2016  . Status post cataract extraction and insertion of intraocular lens of left eye 04/09/2016  . Primary open angle glaucoma of right eye, severe stage 08/10/2015  . Status post eye surgery 08/10/2015  . Recurrent major depression-severe (HCC) 07/13/2015  . Hypertension, benign 07/13/2015  . Hyperlipidemia 07/13/2015  . Cataract 07/13/2015  . Abnormal brain MRI 07/13/2015  . Legally blind in right eye, as defined in [place] 07/13/2015  . Dementia, vascular, with depression (HCC) 09/05/2014  . Benign enlargement of prostate 03/22/2009    Past Surgical History:  Procedure Laterality Date  . CATARACT EXTRACTION Right   . EYE SURGERY      Family History  Problem Relation Age of Onset  . Leukemia Father   . Emphysema Father   . Alcohol abuse Mother     Social History   Tobacco Use  . Smoking status: Former Smoker    Years: 25.00    Types: Cigarettes    Quit date: 12/25/1965    Years since quitting: 53.9  . Smokeless tobacco: Never Used  Substance Use Topics  . Alcohol use: No  Alcohol/week: 0.0 standard drinks     Current Outpatient Medications:  .  aspirin 81 MG tablet, Take 81 mg by mouth daily., Disp: , Rfl:  .  citalopram (CELEXA) 20 MG tablet, TAKE 1 TABLET(20 MG) BY MOUTH DAILY, Disp: 90 tablet, Rfl: 1 .  divalproex (DEPAKOTE) 125 MG DR tablet, Take 1 tablet by mouth every evening., Disp: , Rfl:  .  donepezil (ARICEPT) 10 MG tablet, Take 10 mg by mouth at bedtime., Disp: , Rfl:  .  losartan (COZAAR) 25 MG tablet, Take 1 tablet (25 mg total) by mouth daily., Disp: 90 tablet, Rfl: 0 .   mirtazapine (REMERON) 15 MG tablet, Take 1 tablet (15 mg total) by mouth at bedtime., Disp: 90 tablet, Rfl: 0  Allergies  Allergen Reactions  . Brinzolamide-Brimonidine Other (See Comments)    Red and itchy    I personally reviewed active problem list, medication list, allergies, family history, social history, health maintenance with the patient/caregiver today.   ROS  He is very quiet, wife answered most of the questions  Objective  Vitals:   12/02/19 1004  BP: 102/68  Pulse: 68  Resp: 14  Temp: 98.1 F (36.7 C)  SpO2: 99%  Weight: 108 lb 14.4 oz (49.4 kg)  Height: 5\' 1"  (1.549 m)    Body mass index is 20.58 kg/m.  Physical Exam  Constitutional: Patient appears well-developed and very frail No distress.  HEENT: head atraumatic, normocephalic, pupils equal and reactive to light,  neck supple Cardiovascular: Normal rate, regular rhythm and normal heart sounds.  No murmur heard. No BLE edema. Pulmonary/Chest: Effort normal and breath sounds normal. No respiratory distress. Abdominal: Soft.  There is no tenderness. Psychiatric: Patient has a normal mood and affect. behavior is normal. Judgment and thought content normal.  PHQ2/9: Depression screen [facility] 2/9 12/02/2019 11/15/2019 06/02/2019 12/01/2018 06/01/2018  Decreased Interest 0 3 1 2 1   Down, Depressed, Hopeless 3 3 1 1 1   PHQ - 2 Score 3 6 2 3 2   Altered sleeping 3 - 3 3 0  Tired, decreased energy 3 - 1 0 0  Change in appetite 3 - 1 0 0  Feeling bad or failure about yourself  3 - 1 1 0  Trouble concentrating 3 - 2 2 0  Moving slowly or fidgety/restless 3 - 0 1 0  Suicidal thoughts 3 - 0 0 0  PHQ-9 Score 24 - 10 10 2   Difficult doing work/chores Somewhat difficult - - Somewhat difficult Not difficult at all    phq 9 is positive   Fall Risk: Fall Risk  12/02/2019 11/15/2019 09/23/2019 09/23/2019 09/14/2019  Falls in the past year? 0 1 1 0 0  Number falls in past yr: 0 1 0 0 0  Injury with Fall? 0 0 0 0 0  Comment -  - - - -  Risk for fall due to : - History of fall(s);Impaired vision History of fall(s);Impaired vision;Mental status change - Impaired balance/gait;Impaired vision  Follow up - - Falls evaluation completed - -     Functional Status Survey: Is the patient deaf or have difficulty hearing?: Yes Does the patient have difficulty seeing, even when wearing glasses/contacts?: Yes Does the patient have difficulty concentrating, remembering, or making decisions?: Yes Does the patient have difficulty walking or climbing stairs?: Yes Does the patient have difficulty dressing or bathing?: Yes Does the patient have difficulty doing errands alone such as visiting a doctor's office or shopping?: Yes    Assessment &  Plan  1. Moderate recurrent major depression (HCC)  - citalopram (CELEXA) 20 MG tablet; TAKE 1 TABLET(20 MG) BY MOUTH DAILY  Dispense: 90 tablet; Refill: 1 We will add Wellbutrin, explained needs to be taken early in am and to monitor for agitation and sleep disturbance   2. Hypertension, benign  - losartan (COZAAR) 25 MG tablet; Take 1 tablet (25 mg total) by mouth daily.  Dispense: 90 tablet; Refill: 0  3. Dementia with behavioral disturbance, unspecified dementia type (HCC)  Keep follow up with Dr. [name], needs to verify if taking Depakote  4. Moderate protein-calorie malnutrition (HCC)  - mirtazapine (REMERON) 15 MG tablet; Take 1 tablet (15 mg total) by mouth at bedtime.  Dispense: 90 tablet; Refill: 0  5. Thoracic aortic atherosclerosis (HCC)  Not on statin, on comfort measures only  6. Dementia, vascular, with depression (HCC)   7. Anemia of chronic disease  Reviewed last labs  8. Legally blind in right eye, as defined in [place]

## 2019-12-02 ENCOUNTER — Ambulatory Visit (INDEPENDENT_AMBULATORY_CARE_PROVIDER_SITE_OTHER): Payer: Medicare HMO | Admitting: Family Medicine

## 2019-12-02 ENCOUNTER — Encounter: Payer: Self-pay | Admitting: Family Medicine

## 2019-12-02 ENCOUNTER — Other Ambulatory Visit: Payer: Self-pay

## 2019-12-02 VITALS — BP 102/68 | HR 68 | Temp 98.1°F | Resp 14 | Ht 61.0 in | Wt 108.9 lb

## 2019-12-02 DIAGNOSIS — F0391 Unspecified dementia with behavioral disturbance: Secondary | ICD-10-CM

## 2019-12-02 DIAGNOSIS — F0150 Vascular dementia without behavioral disturbance: Secondary | ICD-10-CM

## 2019-12-02 DIAGNOSIS — F331 Major depressive disorder, recurrent, moderate: Secondary | ICD-10-CM

## 2019-12-02 DIAGNOSIS — E440 Moderate protein-calorie malnutrition: Secondary | ICD-10-CM

## 2019-12-02 DIAGNOSIS — I10 Essential (primary) hypertension: Secondary | ICD-10-CM | POA: Diagnosis not present

## 2019-12-02 DIAGNOSIS — H548 Legal blindness, as defined in USA: Secondary | ICD-10-CM

## 2019-12-02 DIAGNOSIS — F0153 Vascular dementia, unspecified severity, with mood disturbance: Secondary | ICD-10-CM

## 2019-12-02 DIAGNOSIS — F329 Major depressive disorder, single episode, unspecified: Secondary | ICD-10-CM

## 2019-12-02 DIAGNOSIS — D638 Anemia in other chronic diseases classified elsewhere: Secondary | ICD-10-CM

## 2019-12-02 DIAGNOSIS — I700 Atherosclerosis of aorta: Secondary | ICD-10-CM

## 2019-12-02 MED ORDER — CITALOPRAM HYDROBROMIDE 20 MG PO TABS: ORAL_TABLET | ORAL | 1 refills | Status: DC

## 2019-12-02 MED ORDER — BUPROPION HCL ER (XL) 150 MG PO TB24: 150.0000 mg | ORAL_TABLET | Freq: Every day | ORAL | 0 refills | Status: DC

## 2019-12-02 MED ORDER — MIRTAZAPINE 15 MG PO TABS: 15.0000 mg | ORAL_TABLET | Freq: Every day | ORAL | 0 refills | Status: DC

## 2019-12-02 MED ORDER — LOSARTAN POTASSIUM 25 MG PO TABS: 25.0000 mg | ORAL_TABLET | Freq: Every day | ORAL | 0 refills | Status: DC

## 2019-12-05 ENCOUNTER — Telehealth: Payer: Self-pay

## 2019-12-05 NOTE — Telephone Encounter (Signed)
Copied from CRM [id number]. Topic: General - Inquiry >> Dec 05, 2019  1:40 PM Daphine Deutscher D wrote: Reason for CRM: Pt's wife Talbert Forest called saying she was confused about the medications that her husband is taking.  She would like some clarification on what Dr. Carlynn Purl has prescribed for him.  CB#  [phone]

## 2019-12-05 NOTE — Telephone Encounter (Signed)
Called patient to get clarification about medication. No answer, no vm. If they call back please ask what she needs.

## 2019-12-07 ENCOUNTER — Ambulatory Visit: Payer: Self-pay | Admitting: *Deleted

## 2019-12-07 DIAGNOSIS — I10 Essential (primary) hypertension: Secondary | ICD-10-CM

## 2019-12-07 DIAGNOSIS — F0391 Unspecified dementia with behavioral disturbance: Secondary | ICD-10-CM

## 2019-12-07 NOTE — Telephone Encounter (Signed)
Called patient. She no longer has questions about the medication. She has gotten it taken care of.

## 2019-12-08 NOTE — Chronic Care Management (AMB) (Signed)
  Chronic Care Management    Clinical Social Work Follow Up Note  12/08/2019 Name: KYUSS HALE MRN: [id number] DOB: [birth year]  MARKAIL DIEKMAN is a 79 y.o. year old male who is a primary care patient of Alba Cory, MD. The CCM team was consulted for assistance with [facility] .   Review of patient status, including review of consultants reports, other relevant assessments, and collaboration with appropriate care team members and the patient's provider was performed as part of comprehensive patient evaluation and provision of chronic care management services.    SDOH (Social Determinants of Health) assessments performed: No    Outpatient Encounter Medications as of 12/07/2019  Medication Sig  . aspirin 81 MG tablet Take 81 mg by mouth daily.  Marland Kitchen buPROPion (WELLBUTRIN XL) 150 MG 24 hr tablet Take 1 tablet (150 mg total) by mouth daily.  . citalopram (CELEXA) 20 MG tablet TAKE 1 TABLET(20 MG) BY MOUTH DAILY  . divalproex (DEPAKOTE) 125 MG DR tablet Take 1 tablet by mouth every evening.  . donepezil (ARICEPT) 10 MG tablet Take 10 mg by mouth at bedtime.  Marland Kitchen losartan (COZAAR) 25 MG tablet Take 1 tablet (25 mg total) by mouth daily.  . mirtazapine (REMERON) 15 MG tablet Take 1 tablet (15 mg total) by mouth at bedtime.   No facility-administered encounter medications on file as of 12/07/2019.     Goals Addressed              This Visit's Progress   .  Make and Keep All Appointments        Follow Up Date 12/22/2019   - keep a calendar with appointment dates    Why is this important?   Part of staying healthy is seeing the doctor for follow-up care.  If you forget your appointments, there are some things you can do to stay on track.    Notes:     .  per patient's spouse "I would like him to remain active as much as possible" (pt-stated)        Follow Up Date 10/02821   - spend time outdoors at least 3 times a week    Why is this important?   Having a  long-term illness can be scary.  It can also be stressful for you and your caregiver.  These steps may help.    Notes: Patient's spouse to encouraged daily activity of feeding the dogs and picking up sticks in the back yard    .  per patient's spouse "I would like to consider finding help in the community even though he may not go" (pt-stated)        Follow Up Date 12/22/2019   - Followed up on recommendation for patient to consider the Adult Day Program which will be available next month    Patient's wife willing to consider this, however attempts have been made in the past   Notes:  Discussed the possibility of considering an Adult Day Program for patient 1-2x per week        Follow Up Plan: SW will follow up with patient's spouse by phone over the next 7-14 business days   Verna Czech, LCSW Clinical Social Worker  [facility] [phone]

## 2019-12-08 NOTE — Patient Instructions (Addendum)
Thank you allowing the Chronic Care Management Team to be a part of your care! It was a pleasure speaking with you today!  1. Please call this social worker with any questions or concerns regarding patient's community resource needs  CCM (Chronic Care Management) Team   Juanell Fairly  RN, BSN Nurse Care Coordinator  [phone]  Makeda Peeks [address], LCSW Clinical Social Worker [phone]  Goals Addressed              This Visit's Progress   .  Make and Keep All Appointments        Follow Up Date 12/22/2019   - keep a calendar with appointment dates    Why is this important?   Part of staying healthy is seeing the doctor for follow-up care.  If you forget your appointments, there are some things you can do to stay on track.    Notes:     .  per patient's spouse "I would like him to remain active as much as possible" (pt-stated)        Follow Up Date 10/02821   - spend time outdoors at least 3 times a week    Why is this important?   Having a long-term illness can be scary.  It can also be stressful for you and your caregiver.  These steps may help.    Notes: Patient's spouse to encouraged daily activity of feeding the dogs and picking up sticks in the back yard    .  per patient's spouse "I would like to consider finding help in the community even though he may not go" (pt-stated)        Follow Up Date 12/22/2019   - Followed up on recommendation for patient to consider the Adult Day Program which will be available next month    Patient's wife willing to consider this, however attempts have been made in the past   Notes:  Discussed the possibility of considering an Adult Day Program for patient 1-2x per week        The patient verbalized understanding of instructions provided today and declined a print copy of patient instruction materials.   Telephone follow up appointment with care management team member scheduled for: 12/19/19

## 2019-12-19 ENCOUNTER — Telehealth: Payer: Self-pay | Admitting: *Deleted

## 2019-12-21 ENCOUNTER — Telehealth: Payer: Self-pay | Admitting: *Deleted

## 2019-12-23 ENCOUNTER — Ambulatory Visit: Payer: Self-pay | Admitting: *Deleted

## 2019-12-23 NOTE — Chronic Care Management (AMB) (Signed)
  Chronic Care Management   Social Work Note  12/23/2019 Name: Sean Phelps MRN: [id number] DOB: [birth year]  BRODEN HOLT is a 79 y.o. year old male who sees Alba Cory, MD for primary care. The CCM team was consulted for assistance with [facility] .  Phone call to patient's spouse to discuss needed follow up. This social worker was not able to speak to patient's spouse, however was able to reach patient's daughter [place] who discussed need for patient's spouse to have some help in the home. This social worker will follow up with patient's spouse regarding possible in home assistance provided through his insurance benefit.  SDOH (Social Determinants of Health) assessments performed: No     Outpatient Encounter Medications as of 12/23/2019  Medication Sig  . aspirin 81 MG tablet Take 81 mg by mouth daily.  Marland Kitchen buPROPion (WELLBUTRIN XL) 150 MG 24 hr tablet Take 1 tablet (150 mg total) by mouth daily.  . citalopram (CELEXA) 20 MG tablet TAKE 1 TABLET(20 MG) BY MOUTH DAILY  . divalproex (DEPAKOTE) 125 MG DR tablet Take 1 tablet by mouth every evening.  . donepezil (ARICEPT) 10 MG tablet Take 10 mg by mouth at bedtime.  Marland Kitchen losartan (COZAAR) 25 MG tablet Take 1 tablet (25 mg total) by mouth daily.  . mirtazapine (REMERON) 15 MG tablet Take 1 tablet (15 mg total) by mouth at bedtime.   No facility-administered encounter medications on file as of 12/23/2019.    Goals Addressed   None     Follow Up Plan: SW will follow up with patient by phone over the next 5-7 business days  Leighanne Adolph, [place] Clinical Social Worker  [facility] [phone]

## 2019-12-26 ENCOUNTER — Ambulatory Visit: Payer: Self-pay | Admitting: *Deleted

## 2019-12-26 DIAGNOSIS — I10 Essential (primary) hypertension: Secondary | ICD-10-CM

## 2019-12-26 DIAGNOSIS — F0391 Unspecified dementia with behavioral disturbance: Secondary | ICD-10-CM

## 2019-12-27 NOTE — Chronic Care Management (AMB) (Addendum)
  Chronic Care Management    Clinical Social Work Follow Up Note  12/27/2019 Name: Sean Phelps MRN: [id number] DOB: [birth year]  Sean Phelps is a 79 y.o. year old male who is a primary care patient of Alba Cory, MD. The CCM team was consulted for assistance with [facility] .   Review of patient status, including review of consultants reports, other relevant assessments, and collaboration with appropriate care team members and the patient's provider was performed as part of comprehensive patient evaluation and provision of chronic care management services.    SDOH (Social Determinants of Health) assessments performed: No    Outpatient Encounter Medications as of 12/26/2019  Medication Sig  . aspirin 81 MG tablet Take 81 mg by mouth daily.  Marland Kitchen buPROPion (WELLBUTRIN XL) 150 MG 24 hr tablet Take 1 tablet (150 mg total) by mouth daily.  . citalopram (CELEXA) 20 MG tablet TAKE 1 TABLET(20 MG) BY MOUTH DAILY  . divalproex (DEPAKOTE) 125 MG DR tablet Take 1 tablet by mouth every evening.  . donepezil (ARICEPT) 10 MG tablet Take 10 mg by mouth at bedtime.  Marland Kitchen losartan (COZAAR) 25 MG tablet Take 1 tablet (25 mg total) by mouth daily.  . mirtazapine (REMERON) 15 MG tablet Take 1 tablet (15 mg total) by mouth at bedtime.   No facility-administered encounter medications on file as of 12/26/2019.     Goals Addressed              This Visit's Progress   .  Make and Keep All Appointments        Follow Up Date 12/26/2019   - keep a calendar with appointment dates    Why is this important?   Part of staying healthy is seeing the doctor for follow-up care.  If you forget your appointments, there are some things you can do to stay on track.    Notes:     .  per patient's spouse "I would like him to remain active as much as possible" (pt-stated)        Follow Up Date 12/26/19   - spend time outdoors at least 3 times a week    Why is this important?   Having a long-term  illness can be scary.  It can also be stressful for you and your caregiver.  These steps may help.    Notes: Patient's spouse to encouraged daily activity of feeding the dogs and picking up sticks in the back yard    .  per patient's spouse "I would like to consider finding help in the community even though he may not go" (pt-stated)        Follow Up Date 12/26/2019   - Followed up on recommendation for patient to consider the Adult Day Program which will be available next month    Patient's wife willing to consider this, however patient refuses Adult Day Program at this time    Collaboration phone call to [place], to discuss possible custodial care hours. It was confirmed that patient does not have this benefit. It was discussed with patient's spouse, that she continue to allow assistance with patient's care by family to avoid caregiver strain.           Follow Up Plan: This Child psychotherapist will follow up with patient's spouse within 30 days to assess for any additional community resource needs   [employer], LCSW Clinical Social Worker  [facility] [phone]

## 2019-12-27 NOTE — Patient Instructions (Signed)
Thank you allowing the Chronic Care Management Team to be a part of your care! It was a pleasure speaking with you today!  1. Please call this social worker with any questions or concerns regarding patient's in home care needs  CCM (Chronic Care Management) Team   Juanell Fairly RN, BSN Nurse Care Coordinator  [phone]  Dimitrios Balestrieri [address] LCSW Clinical Social Worker [phone]  Goals Addressed              This Visit's Progress   .  Make and Keep All Appointments        Follow Up Date 12/26/2019   - keep a calendar with appointment dates    Why is this important?   Part of staying healthy is seeing the doctor for follow-up care.  If you forget your appointments, there are some things you can do to stay on track.    Notes:     .  per patient's spouse "I would like him to remain active as much as possible" (pt-stated)        Follow Up Date 12/26/19   - spend time outdoors at least 3 times a week    Why is this important?   Having a long-term illness can be scary.  It can also be stressful for you and your caregiver.  These steps may help.    Notes: Patient's spouse to encouraged daily activity of feeding the dogs and picking up sticks in the back yard    .  per patient's spouse "I would like to consider finding help in the community even though he may not go" (pt-stated)        Follow Up Date 12/26/2019   - Followed up on recommendation for patient to consider the Adult Day Program which will be available next month    Patient's wife willing to consider this, however patient refuses Adult Day Program at this time    Collaboration phone call to [place], to discuss possible custodial care hours. It was confirmed that patient does not have this benefit. It was discussed with patient's spouse, that she continue to allow assistance with patient's care by family to avoid caregiver strain.           The patient verbalized understanding of instructions provided today  and declined a print copy of patient instruction materials.   Telephone follow up appointment with care management team member scheduled for:  01/26/20

## 2020-01-26 ENCOUNTER — Telehealth: Payer: Self-pay

## 2020-01-26 ENCOUNTER — Ambulatory Visit: Payer: Self-pay | Admitting: *Deleted

## 2020-01-26 DIAGNOSIS — F0391 Unspecified dementia with behavioral disturbance: Secondary | ICD-10-CM

## 2020-01-26 DIAGNOSIS — I10 Essential (primary) hypertension: Secondary | ICD-10-CM

## 2020-01-26 NOTE — Patient Instructions (Signed)
Thank you allowing the Chronic Care Management Team to be a part of your care! It was a pleasure speaking with you today!  1. Please call this social worker with any questions or concerns regarding patient;s community resource needs.  CCM (Chronic Care Management) Team   Juanell Fairly RN, BSN Nurse Care Coordinator  [phone]  [place], LCSW Clinical Social Worker [phone]  Goals Addressed              This Visit's Progress   .  Make and Keep All Appointments        Follow Up Date 01/26/2020   - keep a calendar with appointment dates    Why is this important?   Part of staying healthy is seeing the doctor for follow-up care.  If you forget your appointments, there are some things you can do to stay on track.    Notes: Per patient's spouse, she mixed up the time of patient's last appointment with the neurologist. Her plan now is to make sure she writes down every appointment date and time to ensure she does not miss any dates     .  per patient's spouse "I would like him to remain active as much as possible" (pt-stated)        Follow Up Date 01/26/20   - spend time outdoors at least 3 times a week    Why is this important?   Having a long-term illness can be scary.  It can also be stressful for you and your caregiver.  These steps may help.    Notes: Patient's spouse continues to  encourage patient to participate in outdoor activities. Patient continues to decline participation in Day Programs and outdoor activities without his spouse being present . [facility] remain available when needed    .  per patient's spouse "I would like to consider finding help in the community even though he may not go" (pt-stated)        Follow Up Date 12/0122021   - Followed up on recommendation for patient to consider the Adult Day Program which will be available next month    Patient's wife willing to consider this, however patient refuses Adult Day Program at  this time    Collaboration phone call to [place], to discuss possible custodial care hours. It was confirmed that patient does not have this benefit. It was discussed with patient's spouse, that she continue to allow assistance with patient's care by family to avoid caregiver strain.           The patient verbalized understanding of instructions, educational materials, and care plan provided today and declined offer to receive copy of patient instructions, educational materials, and care plan.   No further follow up required: please call this social worker with any furher questions or concerns regarding patient's mental health needs

## 2020-01-26 NOTE — Chronic Care Management (AMB) (Signed)
Chronic Care Management    Clinical Social Work Follow Up Note  01/26/2020 Name: Sean Phelps MRN: [id number] DOB: 09/01/1940  Sean Phelps is a 79 y.o. year old male who is a primary care patient of Alba Cory, MD. The CCM team was consulted for assistance with [employer] .   Review of patient status, including review of consultants reports, other relevant assessments, and collaboration with appropriate care team members and the patient's provider was performed as part of comprehensive patient evaluation and provision of chronic care management services.    SDOH (Social Determinants of Health) assessments performed: No    Outpatient Encounter Medications as of 01/26/2020  Medication Sig  . aspirin 81 MG tablet Take 81 mg by mouth daily.  Marland Kitchen buPROPion (WELLBUTRIN XL) 150 MG 24 hr tablet Take 1 tablet (150 mg total) by mouth daily.  . citalopram (CELEXA) 20 MG tablet TAKE 1 TABLET(20 MG) BY MOUTH DAILY  . divalproex (DEPAKOTE) 125 MG DR tablet Take 1 tablet by mouth every evening.  . donepezil (ARICEPT) 10 MG tablet Take 10 mg by mouth at bedtime.  Marland Kitchen losartan (COZAAR) 25 MG tablet Take 1 tablet (25 mg total) by mouth daily.  . mirtazapine (REMERON) 15 MG tablet Take 1 tablet (15 mg total) by mouth at bedtime.   No facility-administered encounter medications on file as of 01/26/2020.     Goals Addressed              This Visit's Progress   .  Make and Keep All Appointments        Follow Up Date 01/26/2020   - keep a calendar with appointment dates    Why is this important?   Part of staying healthy is seeing the doctor for follow-up care.  If you forget your appointments, there are some things you can do to stay on track.    Notes: Per patient's spouse, she mixed up the time of patient's last appointment with the neurologist. Her plan now is to make sure she writes down every appointment date and time to ensure she does not miss any dates     .  per  patient's spouse "I would like him to remain active as much as possible" (pt-stated)        Follow Up Date 01/26/20   - spend time outdoors at least 3 times a week    Why is this important?   Having a long-term illness can be scary.  It can also be stressful for you and your caregiver.  These steps may help.    Notes: Patient's spouse continues to  encourage patient to participate in outdoor activities. Patient continues to decline participation in Day Programs and outdoor activities without his spouse being present . [facility] remain available when needed    .  per patient's spouse "I would like to consider finding help in the community even though he may not go" (pt-stated)        Follow Up Date 12/0122021   - Followed up on recommendation for patient to consider the Adult Day Program which will be available next month    Patient's wife willing to consider this, however patient refuses Adult Day Program at this time    Collaboration phone call to [place], to discuss possible custodial care hours. It was confirmed that patient does not have this benefit. It was discussed with patient's spouse, that she continue to allow assistance with patient's care by family to avoid caregiver  strain.           Follow Up Plan: Client's spouse will call this social worker with any questions or concerns regarding patient's community resource needs   Verna Czech, LCSW Clinical Social Worker  [facility] Care Management [phone]

## 2020-03-06 ENCOUNTER — Other Ambulatory Visit: Payer: Self-pay | Admitting: Family Medicine

## 2020-03-06 DIAGNOSIS — E440 Moderate protein-calorie malnutrition: Secondary | ICD-10-CM

## 2020-03-06 DIAGNOSIS — I10 Essential (primary) hypertension: Secondary | ICD-10-CM

## 2020-03-06 DIAGNOSIS — F331 Major depressive disorder, recurrent, moderate: Secondary | ICD-10-CM

## 2020-03-06 NOTE — Progress Notes (Signed)
Name: Sean Phelps   MRN: [id number]    DOB: [birth year]   Date:03/07/2020       Progress Note  Subjective  Chief Complaint  Follow Up  HPI  HTN: he is taking medication daily for bp, no chest pain, no dizzinessor palpitation.No side effects of medication.BP is at goal today   Hyperlipidemia:it was decidedwith wife and patient in 2019that because of his quality of life it was best to stop simvastatin and we will not longer check cholesterol levels. Unchanged   Dementia: he is forgetful, misplaced things inside the house. He also has hallucinations ( hearing and seeing things) in 2019,paranoia had improved but progressed again during the Summer 2021, wife brought him in and we tried switching from Remeron to Risperdal but she states he is still talking out of his head and getting more sleepy during the day, appetite has also decreased. We changed from Risperdal to Remeron on his last visit, but wife states she still needs to ask him to eat. He has been on Aricept since 2015, Namenda was added in 2019 but cost was prohibitive. Brain MRI showed microvascular disease. Seeing neurologist, Dr [name] who has ordered Depakote but missed appointment Nov 2021 and it was re-scheduled for next week. His  wife is not sure if he is taking it  [employer] , Child psychotherapist discussed adult daycare but wife states he did not want to go. His children stay with him during the day but he usually sleeps most of the time.   Malnutrition:he has Meals on Wheels and is taking Remeron but wife states he still has poor appetite, he lost another 2 lbs since last visit. Discussed protein supplementation   Depression: he has a long history of depression, but worseafterSummer 2017 , legally blind of right eye and vision has decreased, He has been unable to work since August 2017.He is on wellbutrin and citalopram, he is sleeping most of the time, stays in bed all day, wife her to motivate him even to get up  to eat. Discussed therapist but wife states he cannot remember anything   Atherosclerosis of aorta: not on statin by choice, wife states he is back on baby aspirin   Patient Active Problem List   Diagnosis Date Noted  . Moderate dementia, with behavioral disturbance (HCC) 01/20/2017  . Thoracic aortic atherosclerosis (HCC) 07/20/2016  . Corneal edema of right eye 05/19/2016  . Pseudophakia of right eye 04/09/2016  . Status post cataract extraction and insertion of intraocular lens of left eye 04/09/2016  . Primary open angle glaucoma of right eye, severe stage 08/10/2015  . Status post eye surgery 08/10/2015  . Recurrent major depression-severe (HCC) 07/13/2015  . Hypertension, benign 07/13/2015  . Hyperlipidemia 07/13/2015  . Cataract 07/13/2015  . Abnormal brain MRI 07/13/2015  . Legally blind in right eye, as defined in [place] 07/13/2015  . Dementia, vascular, with depression (HCC) 09/05/2014  . Benign enlargement of prostate 03/22/2009    Past Surgical History:  Procedure Laterality Date  . CATARACT EXTRACTION Right   . EYE SURGERY      Family History  Problem Relation Age of Onset  . Leukemia Father   . Emphysema Father   . Alcohol abuse Mother     Social History   Tobacco Use  . Smoking status: Former Smoker    Years: 25.00    Types: Cigarettes    Quit date: 12/25/1965    Years since quitting: 54.2  . Smokeless tobacco: Never Used  Substance Use Topics  . Alcohol use: No    Alcohol/week: 0.0 standard drinks     Current Outpatient Medications:  .  aspirin 81 MG tablet, Take 81 mg by mouth daily., Disp: , Rfl:  .  buPROPion (WELLBUTRIN XL) 150 MG 24 hr tablet, Take 1 tablet (150 mg total) by mouth daily., Disp: 90 tablet, Rfl: 0 .  citalopram (CELEXA) 20 MG tablet, TAKE 1 TABLET(20 MG) BY MOUTH DAILY, Disp: 90 tablet, Rfl: 1 .  divalproex (DEPAKOTE) 125 MG DR tablet, Take 1 tablet by mouth every evening., Disp: , Rfl:  .  donepezil (ARICEPT) 10 MG tablet, Take  10 mg by mouth at bedtime., Disp: , Rfl:  .  losartan (COZAAR) 25 MG tablet, Take 1 tablet (25 mg total) by mouth daily., Disp: 90 tablet, Rfl: 0 .  mirtazapine (REMERON) 15 MG tablet, Take 1 tablet (15 mg total) by mouth at bedtime., Disp: 90 tablet, Rfl: 0  Allergies  Allergen Reactions  . Brinzolamide-Brimonidine Other (See Comments)    Red and itchy    I personally reviewed active problem list, medication list, allergies, family history, social history, health maintenance with the patient/caregiver today.   ROS  Ten systems reviewed and is negative except as mentioned in HPI he states he is alright  Objective  Vitals:   03/07/20 1012  BP: 128/76  Pulse: 68  Resp: 12  Temp: 97.6 F (36.4 C)  SpO2: 97%  Weight: 107 lb 11.2 oz (48.9 kg)  Height: 5\' 1"  (1.549 m)    Body mass index is 20.35 kg/m.  Physical Exam  Constitutional: Patient appears well-developed and malnourished  distress.  HEENT: head atraumatic, normocephalic, right eye is very opaque ,neck supple, throat within normal limits Cardiovascular: Normal rate, regular rhythm and normal heart sounds.  No murmur heard. No BLE edema. Pulmonary/Chest: Effort normal and breath sounds normal. No respiratory distress. Abdominal: Soft.  There is no tenderness. Psychiatric: he is not very engaging, wife did most of the talk   PHQ2/9: Depression screen [facility] 2/9 03/07/2020 12/02/2019 11/15/2019 06/02/2019 12/01/2018  Decreased Interest 0 0 3 1 2   Down, Depressed, Hopeless 3 3 3 1 1   PHQ - 2 Score 3 3 6 2 3   Altered sleeping 0 3 - 3 3  Tired, decreased energy 0 3 - 1 0  Change in appetite 0 3 - 1 0  Feeling bad or failure about yourself  0 3 - 1 1  Trouble concentrating 0 3 - 2 2  Moving slowly or fidgety/restless 3 3 - 0 1  Suicidal thoughts 0 3 - 0 0  PHQ-9 Score 6 24 - 10 10  Difficult doing work/chores Very difficult Somewhat difficult - - Somewhat difficult  Some recent data might be hidden    phq 9 is  positive   Fall Risk: Fall Risk  03/07/2020 12/02/2019 11/15/2019 09/23/2019 09/23/2019  Falls in the past year? 1 0 1 1 0  Number falls in past yr: 1 0 1 0 0  Injury with Fall? 0 0 0 0 0  Comment - - - - -  Risk for fall due to : - - History of fall(s);Impaired vision History of fall(s);Impaired vision;Mental status change -  Follow up - - - Falls evaluation completed -     Functional Status Survey: Is the patient deaf or have difficulty hearing?: Yes Does the patient have difficulty seeing, even when wearing glasses/contacts?: Yes Does the patient have difficulty concentrating, remembering, or making decisions?:  Yes Does the patient have difficulty walking or climbing stairs?: No Does the patient have difficulty dressing or bathing?: Yes Does the patient have difficulty doing errands alone such as visiting a doctor's office or shopping?: Yes   Assessment & Plan 1. Dementia with behavioral disturbance, unspecified dementia type (HCC)  - mirtazapine (REMERON) 15 MG tablet; Take 1 tablet (15 mg total) by mouth at bedtime.  Dispense: 90 tablet; Refill: 1  2. Hypertension, benign  - losartan (COZAAR) 25 MG tablet; Take 1 tablet (25 mg total) by mouth daily.  Dispense: 90 tablet; Refill: 1  3. Thoracic aortic atherosclerosis (HCC)   4. Moderate protein-calorie malnutrition (HCC)  - mirtazapine (REMERON) 15 MG tablet; Take 1 tablet (15 mg total) by mouth at bedtime.  Dispense: 90 tablet; Refill: 1  5. Anemia of chronic disease   6. Moderate recurrent major depression (HCC)  - citalopram (CELEXA) 10 MG tablet; Take 1 tablet (10 mg total) by mouth daily. New dose, discontinue 20 mg dose  Dispense: 90 tablet; Refill: 0  7. Legally blind in right eye, as defined in [place]   8. Dementia, vascular, with depression (HCC)   9. Pure hypercholesterolemia  Not on statin by choice

## 2020-03-07 ENCOUNTER — Encounter: Payer: Self-pay | Admitting: Family Medicine

## 2020-03-07 ENCOUNTER — Other Ambulatory Visit: Payer: Self-pay

## 2020-03-07 ENCOUNTER — Ambulatory Visit (INDEPENDENT_AMBULATORY_CARE_PROVIDER_SITE_OTHER): Payer: Medicare HMO | Admitting: Family Medicine

## 2020-03-07 VITALS — BP 128/76 | HR 68 | Temp 97.6°F | Resp 12 | Ht 61.0 in | Wt 107.7 lb

## 2020-03-07 DIAGNOSIS — F331 Major depressive disorder, recurrent, moderate: Secondary | ICD-10-CM | POA: Diagnosis not present

## 2020-03-07 DIAGNOSIS — E440 Moderate protein-calorie malnutrition: Secondary | ICD-10-CM | POA: Diagnosis not present

## 2020-03-07 DIAGNOSIS — F329 Major depressive disorder, single episode, unspecified: Secondary | ICD-10-CM | POA: Diagnosis not present

## 2020-03-07 DIAGNOSIS — H548 Legal blindness, as defined in USA: Secondary | ICD-10-CM | POA: Diagnosis not present

## 2020-03-07 DIAGNOSIS — F0153 Vascular dementia, unspecified severity, with mood disturbance: Secondary | ICD-10-CM

## 2020-03-07 DIAGNOSIS — F0150 Vascular dementia without behavioral disturbance: Secondary | ICD-10-CM

## 2020-03-07 DIAGNOSIS — I10 Essential (primary) hypertension: Secondary | ICD-10-CM | POA: Diagnosis not present

## 2020-03-07 DIAGNOSIS — I700 Atherosclerosis of aorta: Secondary | ICD-10-CM

## 2020-03-07 DIAGNOSIS — F0391 Unspecified dementia with behavioral disturbance: Secondary | ICD-10-CM | POA: Diagnosis not present

## 2020-03-07 DIAGNOSIS — D638 Anemia in other chronic diseases classified elsewhere: Secondary | ICD-10-CM | POA: Diagnosis not present

## 2020-03-07 DIAGNOSIS — E7800 Pure hypercholesterolemia, unspecified: Secondary | ICD-10-CM | POA: Diagnosis not present

## 2020-03-07 DIAGNOSIS — R69 Illness, unspecified: Secondary | ICD-10-CM | POA: Diagnosis not present

## 2020-03-07 MED ORDER — MIRTAZAPINE 15 MG PO TABS: 15.0000 mg | ORAL_TABLET | Freq: Every day | ORAL | 1 refills | Status: DC

## 2020-03-07 MED ORDER — CITALOPRAM HYDROBROMIDE 10 MG PO TABS: 10.0000 mg | ORAL_TABLET | Freq: Every day | ORAL | 0 refills | Status: DC

## 2020-03-07 MED ORDER — LOSARTAN POTASSIUM 25 MG PO TABS: 25.0000 mg | ORAL_TABLET | Freq: Every day | ORAL | 1 refills | Status: DC

## 2020-03-07 NOTE — Patient Instructions (Signed)

## 2020-05-01 DIAGNOSIS — E038 Other specified hypothyroidism: Secondary | ICD-10-CM | POA: Diagnosis not present

## 2020-05-01 DIAGNOSIS — R443 Hallucinations, unspecified: Secondary | ICD-10-CM | POA: Diagnosis not present

## 2020-05-01 DIAGNOSIS — E538 Deficiency of other specified B group vitamins: Secondary | ICD-10-CM | POA: Diagnosis not present

## 2020-05-01 DIAGNOSIS — R69 Illness, unspecified: Secondary | ICD-10-CM | POA: Diagnosis not present

## 2020-05-01 DIAGNOSIS — R413 Other amnesia: Secondary | ICD-10-CM | POA: Diagnosis not present

## 2020-05-02 ENCOUNTER — Telehealth: Payer: Self-pay

## 2020-05-02 NOTE — Telephone Encounter (Signed)
Copied from CRM [id number]. Topic: General - Other >> May 02, 2020 12:42 PM Mcneil, Ja-Kwan wrote: Reason for CRM: Pt wife called the pt most recent labs that were done at another location was sent to pt pcp. There is no one listed on DPR. Pt wife asked if the nurse would return her call.

## 2020-05-03 ENCOUNTER — Telehealth: Payer: Self-pay

## 2020-05-03 NOTE — Telephone Encounter (Signed)
Copied from CRM [id number]. Topic: General - Other >> May 02, 2020 12:42 PM Mcneil, Ja-Kwan wrote: Reason for CRM: Pt wife called the pt most recent labs that were done at another location was sent to pt pcp. There is no one listed on DPR. Pt wife asked if the nurse would return her call. >> May 03, 2020 10:51 AM Tamela Oddi wrote: Patients wife is calling again to ask the nurse or doctor to call regarding patient's labs.  She stated she still needs someone to go over the results with her.

## 2020-05-03 NOTE — Telephone Encounter (Signed)
Pt wife states nurse called from [facility] stated there was protein in urine?  Is there any concern on that per wife and what do they need to do?

## 2020-05-04 NOTE — Telephone Encounter (Signed)
Pt wife notified and appt moved up

## 2020-05-04 NOTE — Telephone Encounter (Addendum)
Return to them or with you? Wife just said they told her there was protein in urine and they would send results to you?

## 2020-05-07 DIAGNOSIS — F22 Delusional disorders: Secondary | ICD-10-CM | POA: Insufficient documentation

## 2020-05-07 DIAGNOSIS — E538 Deficiency of other specified B group vitamins: Secondary | ICD-10-CM | POA: Insufficient documentation

## 2020-05-07 DIAGNOSIS — R443 Hallucinations, unspecified: Secondary | ICD-10-CM | POA: Insufficient documentation

## 2020-05-07 DIAGNOSIS — E038 Other specified hypothyroidism: Secondary | ICD-10-CM | POA: Insufficient documentation

## 2020-05-13 ENCOUNTER — Other Ambulatory Visit: Payer: Self-pay | Admitting: Family Medicine

## 2020-05-13 DIAGNOSIS — F331 Major depressive disorder, recurrent, moderate: Secondary | ICD-10-CM

## 2020-05-13 NOTE — Telephone Encounter (Signed)
Requested Prescriptions  Pending Prescriptions Disp Refills  . citalopram (CELEXA) 10 MG tablet [Pharmacy Med Name: Citalopram Hydrobromide 10 MG Oral Tablet] 90 tablet 0    Sig: Take 1 tablet by mouth once daily     Psychiatry:  Antidepressants - SSRI Passed - 05/13/2020  5:48 PM      Passed - Completed PHQ-2 or PHQ-9 in the last 360 days      Passed - Valid encounter within last 6 months    Recent Outpatient Visits          2 months ago Dementia with behavioral disturbance, unspecified dementia type [facility])   [facility] [place], Danna Hefty, MD   5 months ago Dementia with behavioral disturbance, unspecified dementia type [facility])   [facility] Alba Cory, MD   6 months ago Dementia with behavioral disturbance, unspecified dementia type [facility])   [facility] Alba Cory, MD   7 months ago Rash and nonspecific skin eruption   [facility] [place], Danna Hefty, MD   8 months ago Rash and nonspecific skin eruption   [facility] Danelle Berry, [place]      Future Appointments            In 1 month Alba Cory, MD [facility]

## 2020-05-15 ENCOUNTER — Emergency Department (HOSPITAL_COMMUNITY): Payer: Medicare HMO

## 2020-05-15 ENCOUNTER — Other Ambulatory Visit: Payer: Self-pay

## 2020-05-15 ENCOUNTER — Emergency Department (HOSPITAL_COMMUNITY)
Admission: EM | Admit: 2020-05-15 | Discharge: 2020-05-16 | Disposition: A | Discharge status: Home / Self Care | Payer: Medicare HMO | Attending: Emergency Medicine | Admitting: Emergency Medicine

## 2020-05-15 ENCOUNTER — Encounter (HOSPITAL_COMMUNITY): Payer: Self-pay | Admitting: Emergency Medicine

## 2020-05-15 DIAGNOSIS — Z79899 Other long term (current) drug therapy: Secondary | ICD-10-CM | POA: Diagnosis not present

## 2020-05-15 DIAGNOSIS — J45909 Unspecified asthma, uncomplicated: Secondary | ICD-10-CM | POA: Insufficient documentation

## 2020-05-15 DIAGNOSIS — Z87891 Personal history of nicotine dependence: Secondary | ICD-10-CM | POA: Insufficient documentation

## 2020-05-15 DIAGNOSIS — F0391 Unspecified dementia with behavioral disturbance: Secondary | ICD-10-CM | POA: Insufficient documentation

## 2020-05-15 DIAGNOSIS — Z7982 Long term (current) use of aspirin: Secondary | ICD-10-CM | POA: Diagnosis not present

## 2020-05-15 DIAGNOSIS — M4316 Spondylolisthesis, lumbar region: Secondary | ICD-10-CM | POA: Diagnosis not present

## 2020-05-15 DIAGNOSIS — I10 Essential (primary) hypertension: Secondary | ICD-10-CM | POA: Insufficient documentation

## 2020-05-15 DIAGNOSIS — N200 Calculus of kidney: Secondary | ICD-10-CM | POA: Diagnosis not present

## 2020-05-15 DIAGNOSIS — R69 Illness, unspecified: Secondary | ICD-10-CM | POA: Diagnosis not present

## 2020-05-15 DIAGNOSIS — R109 Unspecified abdominal pain: Secondary | ICD-10-CM | POA: Diagnosis not present

## 2020-05-15 DIAGNOSIS — D649 Anemia, unspecified: Secondary | ICD-10-CM

## 2020-05-15 DIAGNOSIS — I700 Atherosclerosis of aorta: Secondary | ICD-10-CM | POA: Diagnosis not present

## 2020-05-15 LAB — COMPREHENSIVE METABOLIC PANEL
ALT: 12 U/L (ref 0–44)
AST: 17 U/L (ref 15–41)
Albumin: 3.9 g/dL (ref 3.5–5.0)
Alkaline Phosphatase: 77 U/L (ref 38–126)
Anion gap: 8 (ref 5–15)
BUN: 18 mg/dL (ref 8–23)
CO2: 28 mmol/L (ref 22–32)
Calcium: 9 mg/dL (ref 8.9–10.3)
Chloride: 102 mmol/L (ref 98–111)
Creatinine, Ser: 1.01 mg/dL (ref 0.61–1.24)
GFR, Estimated: >60 mL/min (ref >60)
Glucose, Bld: 99 mg/dL (ref 70–99)
Potassium: 4.2 mmol/L (ref 3.5–5.1)
Sodium: 138 mmol/L (ref 135–145)
Total Bilirubin: 0.7 mg/dL (ref 0.3–1.2)
Total Protein: 7.4 g/dL (ref 6.5–8.1)

## 2020-05-15 LAB — CBC
HCT: 39.8 % (ref 39.0–52.0)
Hemoglobin: 12.6 g/dL — ABNORMAL LOW (ref 13.0–17.0)
MCH: 29.8 pg (ref 26.0–34.0)
MCHC: 31.7 g/dL (ref 30.0–36.0)
MCV: 94.1 fL (ref 80.0–100.0)
Platelets: 199 10*3/uL (ref 150–400)
RBC: 4.23 MIL/uL (ref 4.22–5.81)
RDW: 11.6 % (ref 11.5–15.5)
WBC: 4.5 10*3/uL (ref 4.0–10.5)
nRBC: 0 % (ref 0.0–0.2)

## 2020-05-15 LAB — LIPASE, BLOOD: Lipase: 31 U/L (ref 11–51)

## 2020-05-15 NOTE — ED Triage Notes (Signed)
Pt to the ED with abdominal pain x 2 days.  Pt has a history of Dementia and is accompanied by his daughter for history.

## 2020-05-16 ENCOUNTER — Emergency Department (HOSPITAL_COMMUNITY): Payer: Medicare HMO

## 2020-05-16 DIAGNOSIS — D649 Anemia, unspecified: Secondary | ICD-10-CM | POA: Diagnosis not present

## 2020-05-16 DIAGNOSIS — R109 Unspecified abdominal pain: Secondary | ICD-10-CM | POA: Diagnosis not present

## 2020-05-16 DIAGNOSIS — I700 Atherosclerosis of aorta: Secondary | ICD-10-CM | POA: Diagnosis not present

## 2020-05-16 DIAGNOSIS — N200 Calculus of kidney: Secondary | ICD-10-CM | POA: Diagnosis not present

## 2020-05-16 DIAGNOSIS — I10 Essential (primary) hypertension: Secondary | ICD-10-CM | POA: Diagnosis not present

## 2020-05-16 DIAGNOSIS — M4316 Spondylolisthesis, lumbar region: Secondary | ICD-10-CM | POA: Diagnosis not present

## 2020-05-16 LAB — URINALYSIS, ROUTINE W REFLEX MICROSCOPIC
Bilirubin Urine: NEGATIVE
Glucose, UA: NEGATIVE mg/dL
Hgb urine dipstick: NEGATIVE
Ketones, ur: 5 mg/dL — AB
Leukocytes,Ua: NEGATIVE
Nitrite: NEGATIVE
Protein, ur: 100 mg/dL — AB
Specific Gravity, Urine: 1.027 (ref 1.005–1.030)
pH: 5 (ref 5.0–8.0)

## 2020-05-16 NOTE — Discharge Instructions (Addendum)
The evaluation today did not show the cause for the abdominal pain, but did not find any sign of any serious conditions.  He may get symptomatic treatment for pain including antacids, acetaminophen.  If pain seems to be getting worse at any time, please bring him back for reevaluation.

## 2020-05-16 NOTE — ED Provider Notes (Signed)
[facility] EMERGENCY DEPARTMENT Provider Note   CSN: [id number] Arrival date & time: 05/15/20  1922   History Chief Complaint  Patient presents with  . Abdominal Pain    Sean Phelps is a 80 y.o. male.  The history is provided by a relative. The history is limited by the condition of the patient (Dementia).  Abdominal Pain He has history of hypertension, hyperlipidemia, dementia and is brought in by family member because of complaints of abdominal pain for the last week.  There has been no vomiting or diarrhea, but he has not been eating normally.  There has been no fever or chills.  Patient is not able to assist me with the localizing where the pain is.  Family members not sure what part of his abdomen was hurting.  Past Medical History:  Diagnosis Date  . Asthma   . Brachial neuritis   . Brachial neuritis   . Elevated cholesterol 07/20/14  . Hypertension   . Lumbago   . Thoracic aortic atherosclerosis (HCC) 07/20/2016   Chest xray May 2018    Patient Active Problem List   Diagnosis Date Noted  . Moderate dementia, with behavioral disturbance (HCC) 01/20/2017  . Thoracic aortic atherosclerosis (HCC) 07/20/2016  . Corneal edema of right eye 05/19/2016  . Pseudophakia of right eye 04/09/2016  . Status post cataract extraction and insertion of intraocular lens of left eye 04/09/2016  . Primary open angle glaucoma of right eye, severe stage 08/10/2015  . Status post eye surgery 08/10/2015  . Recurrent major depression-severe (HCC) 07/13/2015  . Hypertension, benign 07/13/2015  . Hyperlipidemia 07/13/2015  . Cataract 07/13/2015  . Abnormal brain MRI 07/13/2015  . Legally blind in right eye, as defined in [place] 07/13/2015  . Dementia, vascular, with depression (HCC) 09/05/2014  . Benign enlargement of prostate 03/22/2009    Past Surgical History:  Procedure Laterality Date  . CATARACT EXTRACTION Right   . EYE SURGERY         Family History  Problem Relation Age  of Onset  . Leukemia Father   . Emphysema Father   . Alcohol abuse Mother     Social History   Tobacco Use  . Smoking status: Former Smoker    Years: 25.00    Types: Cigarettes    Quit date: 12/25/1965    Years since quitting: 54.4  . Smokeless tobacco: Never Used  Vaping Use  . Vaping Use: Never used  Substance Use Topics  . Alcohol use: No    Alcohol/week: 0.0 standard drinks  . Drug use: No    Home Medications Prior to Admission medications   Medication Sig Start Date End Date Taking? Authorizing Provider  aspirin 81 MG tablet Take 81 mg by mouth daily.    [provider]  citalopram (CELEXA) 10 MG tablet Take 1 tablet by mouth once daily 05/13/20   Alba Cory, MD  divalproex (DEPAKOTE) 125 MG DR tablet Take 1 tablet by mouth every evening. 01/20/17   Morene Crocker, MD  donepezil (ARICEPT) 10 MG tablet Take 10 mg by mouth at bedtime. 11/14/18   Morene Crocker, MD  losartan (COZAAR) 25 MG tablet Take 1 tablet (25 mg total) by mouth daily. 03/07/20   Alba Cory, MD  mirtazapine (REMERON) 15 MG tablet Take 1 tablet (15 mg total) by mouth at bedtime. 03/07/20   Alba Cory, MD    Allergies    Brinzolamide-brimonidine  Review of Systems   Review of Systems  Unable  to perform ROS: Dementia  Gastrointestinal: Positive for abdominal pain.    Physical Exam Updated Vital Signs BP 123/73   Pulse 65   Temp 98.7 F (37.1 C) (Oral)   Resp 15   Ht 5\' 2"  (1.575 m)   Wt 47.6 kg   SpO2 99%   BMI 19.20 kg/m   Physical Exam Vitals and nursing note reviewed.   80 year old male, resting comfortably and in no acute distress. Vital signs are normal. Oxygen saturation is 99%, which is normal. Head is normocephalic and atraumatic. PERRLA, EOMI. Oropharynx is clear. Neck is nontender and supple without adenopathy or JVD. Back is nontender and there is no CVA tenderness. Lungs are clear without rales, wheezes, or rhonchi. Chest is nontender. Heart has  regular rate and rhythm without murmur. Abdomen is soft, flat, nontender without masses or hepatosplenomegaly and peristalsis is hypoactive. Extremities have no cyanosis or edema, full range of motion is present. Skin is warm and dry without rash. Neurologic: Awake and alert but nonverbal, cranial nerves are intact, moves all extremities equally.  ED Results / Procedures / Treatments   Labs (all labs ordered are listed, but only abnormal results are displayed) Labs Reviewed  CBC - Abnormal; Notable for the following components:      Result Value   Hemoglobin 12.6 (*)    All other components within normal limits  URINALYSIS, ROUTINE W REFLEX MICROSCOPIC - Abnormal; Notable for the following components:   Ketones, ur 5 (*)    Protein, ur 100 (*)    Bacteria, UA RARE (*)    All other components within normal limits  LIPASE, BLOOD  COMPREHENSIVE METABOLIC PANEL    EKG EKG Interpretation  Date/Time:  Tuesday May 15 2020 22:47:25 EDT Ventricular Rate:  61 PR Interval:    QRS Duration: 69 QT Interval:  436 QTC Calculation: 440 R Axis:   71 Text Interpretation: Sinus rhythm Consider right atrial enlargement ST elevation, consider inferior injury since last tracing no significant change Confirmed by 07-18-1978 [phone]) on 05/15/2020 11:14:50 PM   Radiology DG Abdomen 1 View  Result Date: 05/15/2020 CLINICAL DATA:  General abdominal pain x 2 days. Pt has a history of Dementia, unable to elaborate on pain or symptoms. Hx of HTN. EXAM: ABDOMEN - 1 VIEW COMPARISON:  Ct abd/pelvis 04/01/12 FINDINGS: The bowel gas pattern is normal. No radio-opaque calculi or other significant radiographic abnormality are seen. IMPRESSION: Negative. Electronically Signed   By: 05/30/12 M.D.   On: 05/15/2020 23:08   CT Renal Stone Study  Result Date: 05/16/2020 CLINICAL DATA:  Flank pain EXAM: CT ABDOMEN AND PELVIS WITHOUT CONTRAST TECHNIQUE: Multidetector CT imaging of the abdomen and pelvis was  performed following the standard protocol without IV contrast. COMPARISON:  None. FINDINGS: LOWER CHEST: Normal. HEPATOBILIARY: Normal hepatic contours. No intra- or extrahepatic biliary dilatation. Normal gallbladder. PANCREAS: Normal pancreas. No ductal dilatation or peripancreatic fluid collection. SPLEEN: Normal. ADRENALS/URINARY TRACT: The adrenal glands are normal. Multiple punctate nonobstructive calculi of the right kidney. No hydronephrosis or perinephric stranding. No ureteral dilatation. No solid renal mass. The urinary bladder is normal for degree of distention STOMACH/BOWEL: There is no hiatal hernia. Normal duodenal course and caliber. No small bowel dilatation or inflammation. No focal colonic abnormality. Normal appendix. VASCULAR/LYMPHATIC: There is calcific atherosclerosis of the abdominal aorta. No lymphadenopathy. REPRODUCTIVE: Normal prostate size with symmetric seminal vesicles. MUSCULOSKELETAL. Grade 1 anterolisthesis at L4-5. OTHER: None. IMPRESSION: 1. No obstructive uropathy or other acute abnormality of  the abdomen or pelvis. 2. Multiple punctate nonobstructive calculi of the right kidney. Aortic Atherosclerosis (ICD10-I70.0). Electronically Signed   By: Deatra Robinson M.D.   On: 05/16/2020 03:24    Procedures Procedures   Medications Ordered in ED Medications - No data to display  ED Course  I have reviewed the triage vital signs and the nursing notes.  Pertinent labs & imaging results that were available during my care of the patient were reviewed by me and considered in my medical decision making (see chart for details).  MDM Rules/Calculators/A&P Abdominal pain of uncertain cause.  Exam is benign, but I am not sure of the validity based on his severity of his dementia.  Labs are reassuring.  He has mild anemia which is unchanged from baseline.  Old records are reviewed, and he has no relevant past visits.  Will send for CT of abdomen and pelvis.  CT scan shows no acute  process, no explanation for his pain.  Family was reassured about the benign work-up, patient is discharged.  Return precautions discussed.  Final Clinical Impression(s) / ED Diagnoses Final diagnoses:  Abdominal pain, unspecified abdominal location  Normochromic normocytic anemia    Rx / DC Orders ED Discharge Orders    None       Dione Booze, MD 05/16/20 [phone]

## 2020-05-18 ENCOUNTER — Other Ambulatory Visit: Payer: Self-pay | Admitting: Family Medicine

## 2020-05-18 ENCOUNTER — Telehealth: Payer: Self-pay

## 2020-05-18 DIAGNOSIS — F331 Major depressive disorder, recurrent, moderate: Secondary | ICD-10-CM

## 2020-05-18 NOTE — Telephone Encounter (Signed)
Spoke with wife and clarified medications prescribed by Dr. Carlynn Purl. Advised her to call Dr. Daisy Blossom office to clarify his neurology medications.

## 2020-05-18 NOTE — Telephone Encounter (Signed)
Copied from CRM [id number]. Topic: General - Other >> May 18, 2020 10:24 AM Gwenlyn Fudge wrote: Reason for CRM: Pts wife called with questions about which medications the pt should be taking. Attempted to assist, but name is not on pts DPR and pt was sleeping. She is requesting to have a call back from the nurse to go over his medications. Please advise.

## 2020-05-22 NOTE — Telephone Encounter (Signed)
Pts wife called back wanting to speak with Dr Carlynn Purl nurse, caller still had questions regarding the Pts medications.

## 2020-05-23 NOTE — Telephone Encounter (Signed)
Left vm for call back

## 2020-05-25 NOTE — Progress Notes (Signed)
Name: Sean Phelps   MRN: [id number]    DOB: [birth year]   Date:05/28/2020       Progress Note  Subjective  Chief Complaint  ER Follow up   HPI  He came in with his wife, and his daughter - Drinda Butts  Lethargy: his wife noticed that he has more hallucinations, gets restless and has to move from coach to the chair.  He was seen by Dr. [name] early March and had labs done, he found some protein in his urine and was sent to [facility], but more labs were done and sent home without antibiotics. The change has been gradual but progressive worse . Wife states he complains of stomach pains, but once they make him eat the symptoms resolved. Discussed considering PPI but they would like to hold off for now.   He has 24 hours care, either 3 daughters and one of his nieces and also wife ( still working part time) Discussed hospice as a form of resource   Malnutrition: he continues to lose weight, B12 towards low end of normal, he likely forgets to eat.   Thoracic atherosclerosis of aorta: not on statin and family is aware  Patient Active Problem List   Diagnosis Date Noted  . B12 deficiency 05/07/2020  . Delusions (HCC) 05/07/2020  . Hallucination 05/07/2020  . TSH (thyroid-stimulating hormone deficiency) 05/07/2020  . Moderate dementia, with behavioral disturbance (HCC) 01/20/2017  . Loss of memory 12/03/2016  . Thoracic aortic atherosclerosis (HCC) 07/20/2016  . Corneal edema of right eye 05/19/2016  . Pseudophakia of right eye 04/09/2016  . Status post cataract extraction and insertion of intraocular lens of left eye 04/09/2016  . Primary open angle glaucoma of right eye, severe stage 08/10/2015  . Status post eye surgery 08/10/2015  . Recurrent major depression-severe (HCC) 07/13/2015  . Hypertension, benign 07/13/2015  . Hyperlipidemia 07/13/2015  . Cataract 07/13/2015  . Abnormal brain MRI 07/13/2015  . Legally blind in right eye, as defined in [place] 07/13/2015  . Dementia, vascular, with  depression (HCC) 09/05/2014  . Benign enlargement of prostate 03/22/2009    Past Surgical History:  Procedure Laterality Date  . CATARACT EXTRACTION Right   . EYE SURGERY      Family History  Problem Relation Age of Onset  . Leukemia Father   . Emphysema Father   . Alcohol abuse Mother     Social History   Tobacco Use  . Smoking status: Former Smoker    Years: 25.00    Types: Cigarettes    Quit date: 12/25/1965    Years since quitting: 54.4  . Smokeless tobacco: Never Used  Substance Use Topics  . Alcohol use: No    Alcohol/week: 0.0 standard drinks     Current Outpatient Medications:  .  aspirin 81 MG tablet, Take 81 mg by mouth daily., Disp: , Rfl:  .  citalopram (CELEXA) 10 MG tablet, Take 1 tablet by mouth once daily, Disp: 90 tablet, Rfl: 0 .  donepezil (ARICEPT) 10 MG tablet, Take 10 mg by mouth at bedtime., Disp: , Rfl:  .  losartan (COZAAR) 25 MG tablet, Take 1 tablet (25 mg total) by mouth daily., Disp: 90 tablet, Rfl: 1 .  memantine (NAMENDA) 10 MG tablet, Take 10 mg by mouth 2 (two) times daily., Disp: , Rfl:   Allergies  Allergen Reactions  . Brinzolamide-Brimonidine Other (See Comments)    Red and itchy    I personally reviewed active problem list, medication list, allergies, family  history, social history, health maintenance with the patient/caregiver today.   ROS  Constitutional: Negative for fever, positive for  weight change.  Respiratory: Negative for cough and shortness of breath.   Cardiovascular: Negative for chest pain or palpitations.  Gastrointestinal: Negative for abdominal pain, no bowel changes.  Musculoskeletal: Negative for gait problem or joint swelling.  Skin: Negative for rash.  Neurological: Negative for dizziness or headache.  No other specific complaints in a complete review of systems (except as listed in HPI above).  Objective  Vitals:   05/28/20 1003  BP: 116/62  Pulse: 62  Resp: 14  Temp: 97.8 F (36.6 C)   TempSrc: Oral  SpO2: 98%  Weight: 104 lb 3.2 oz (47.3 kg)  Height: 5\' 1"  (1.549 m)    Body mass index is 19.69 kg/m.  Physical Exam  Constitutional: Patient appears well-developed and malnourished, temporal waisting Very lethargic, sleeping on the table, he is usually sitting up and alert  HEENT: head atraumatic, normocephalic, pupils equal and reactive to light,  neck supple Cardiovascular: Normal rate, regular rhythm and normal heart sounds.  No murmur heard. No BLE edema. Pulmonary/Chest: Effort normal and breath sounds normal. No respiratory distress. Abdominal: Soft.  There is no tenderness. Psychiatric:drowsy   Recent Results (from the past 2160 hour(s))  Lipase, blood     Status: None   Collection Time: 05/15/20  8:59 PM  Result Value Ref Range   Lipase 31 11 - 51 U/L    Comment: Performed at [facility], [address] [place], [place]  Comprehensive metabolic panel     Status: None   Collection Time: 05/15/20  8:59 PM  Result Value Ref Range   Sodium 138 135 - 145 mmol/L   Potassium 4.2 3.5 - 5.1 mmol/L   Chloride 102 98 - 111 mmol/L   CO2 28 22 - 32 mmol/L   Glucose, Bld 99 70 - 99 mg/dL    Comment: Glucose reference range applies only to samples taken after fasting for at least 8 hours.   BUN 18 8 - 23 mg/dL   Creatinine, Ser 05/17/20 0.61 - 1.24 mg/dL   Calcium 9.0 8.9 - 2.87 mg/dL   Total Protein 7.4 6.5 - 8.1 g/dL   Albumin 3.9 3.5 - 5.0 g/dL   AST 17 15 - 41 U/L   ALT 12 0 - 44 U/L   Alkaline Phosphatase 77 38 - 126 U/L   Total Bilirubin 0.7 0.3 - 1.2 mg/dL   GFR, Estimated 68.1 >15 mL/min    Comment: (NOTE) Calculated using the CKD-EPI Creatinine Equation (2021)    Anion gap 8 5 - 15    Comment: Performed at [facility], [address] [place], [place]  CBC     Status: Abnormal   Collection Time: 05/15/20  8:59 PM  Result Value Ref Range   WBC 4.5 4.0 - 10.5 K/uL   RBC 4.23 4.22 - 5.81 MIL/uL   Hemoglobin 12.6 (L) 13.0 - 17.0  g/dL   HCT 05/17/20 59.7 - 41.6 %   MCV 94.1 80.0 - 100.0 fL   MCH 29.8 26.0 - 34.0 pg   MCHC 31.7 30.0 - 36.0 g/dL   RDW 38.4 53.6 - 46.8 %   Platelets 199 150 - 400 K/uL   nRBC 0.0 0.0 - 0.2 %    Comment: Performed at [facility], [address] [place], [place]  Urinalysis, Routine w reflex microscopic Urine, Clean Catch  Status: Abnormal   Collection Time: 05/16/20  1:43 AM  Result Value Ref Range   Color, Urine YELLOW YELLOW   APPearance CLEAR CLEAR   Specific Gravity, Urine 1.027 1.005 - 1.030   pH 5.0 5.0 - 8.0   Glucose, UA NEGATIVE NEGATIVE mg/dL   Hgb urine dipstick NEGATIVE NEGATIVE   Bilirubin Urine NEGATIVE NEGATIVE   Ketones, ur 5 (A) NEGATIVE mg/dL   Protein, ur 854 (A) NEGATIVE mg/dL   Nitrite NEGATIVE NEGATIVE   Leukocytes,Ua NEGATIVE NEGATIVE   RBC / HPF 0-5 0 - 5 RBC/hpf   WBC, UA 0-5 0 - 5 WBC/hpf   Bacteria, UA RARE (A) NONE SEEN   Squamous Epithelial / LPF 0-5 0 - 5   Mucus PRESENT     Comment: Performed at [facility], [address], [place], [place] [address]      PHQ2/9: Depression screen [facility] 2/9 05/28/2020 03/07/2020 12/02/2019 11/15/2019 06/02/2019  Decreased Interest 0 0 0 3 1  Down, Depressed, Hopeless 0 3 3 3 1   PHQ - 2 Score 0 3 3 6 2   Altered sleeping - 0 3 - 3  Tired, decreased energy - 0 3 - 1  Change in appetite - 0 3 - 1  Feeling bad or failure about yourself  - 0 3 - 1  Trouble concentrating - 0 3 - 2  Moving slowly or fidgety/restless - 3 3 - 0  Suicidal thoughts - 0 3 - 0  PHQ-9 Score - 6 24 - 10  Difficult doing work/chores - Very difficult Somewhat difficult - -  Some recent data might be hidden    phq 9 not reliable, wife gave information    Fall Risk: Fall Risk  05/28/2020 03/07/2020 12/02/2019 11/15/2019 09/23/2019  Falls in the past year? 0 1 0 1 1  Number falls in past yr: 0 1 0 1 0  Injury with Fall? 0 0 0 0 0  Comment - - - - -  Risk for fall due to : - - - History of fall(s);Impaired vision History of  fall(s);Impaired vision;Mental status change  Follow up - - - - Falls evaluation completed      Functional Status Survey: Is the patient deaf or have difficulty hearing?: Yes Does the patient have difficulty seeing, even when wearing glasses/contacts?: Yes Does the patient have difficulty concentrating, remembering, or making decisions?: Yes Does the patient have difficulty walking or climbing stairs?: No Does the patient have difficulty dressing or bathing?: Yes Does the patient have difficulty doing errands alone such as visiting a doctor's office or shopping?: Yes    Assessment & Plan  1. Lethargy  - CULTURE, URINE COMPREHENSIVE  2. Dementia with behavioral disturbance, unspecified dementia type (HCC)  Seems to be getting worse, discussed hospice but family wants to think about it  3. Thoracic aortic atherosclerosis (HCC)  Not on statin  4. Legally blind in right eye, as defined in 11/17/2019   5. Moderate protein-calorie malnutrition (HCC)  Discussed high calorie diet   6. Proteinuria, unspecified type  - Microalbumin / creatinine urine ratio  7. B12 deficiency  - cyanocobalamin ((VITAMIN B-12)) injection 1,000 mcg.

## 2020-05-28 ENCOUNTER — Encounter: Payer: Self-pay | Admitting: Family Medicine

## 2020-05-28 ENCOUNTER — Other Ambulatory Visit: Payer: Self-pay

## 2020-05-28 ENCOUNTER — Ambulatory Visit (INDEPENDENT_AMBULATORY_CARE_PROVIDER_SITE_OTHER): Payer: Medicare HMO | Admitting: Family Medicine

## 2020-05-28 VITALS — BP 116/62 | HR 62 | Temp 97.8°F | Resp 14 | Ht 61.0 in | Wt 104.2 lb

## 2020-05-28 DIAGNOSIS — F0391 Unspecified dementia with behavioral disturbance: Secondary | ICD-10-CM | POA: Diagnosis not present

## 2020-05-28 DIAGNOSIS — H548 Legal blindness, as defined in USA: Secondary | ICD-10-CM

## 2020-05-28 DIAGNOSIS — I700 Atherosclerosis of aorta: Secondary | ICD-10-CM

## 2020-05-28 DIAGNOSIS — E440 Moderate protein-calorie malnutrition: Secondary | ICD-10-CM | POA: Diagnosis not present

## 2020-05-28 DIAGNOSIS — R5383 Other fatigue: Secondary | ICD-10-CM

## 2020-05-28 DIAGNOSIS — E538 Deficiency of other specified B group vitamins: Secondary | ICD-10-CM | POA: Diagnosis not present

## 2020-05-28 DIAGNOSIS — R809 Proteinuria, unspecified: Secondary | ICD-10-CM | POA: Diagnosis not present

## 2020-05-28 DIAGNOSIS — R69 Illness, unspecified: Secondary | ICD-10-CM | POA: Diagnosis not present

## 2020-05-28 MED ORDER — CYANOCOBALAMIN 1000 MCG/ML IJ SOLN
1000.0000 ug | Freq: Once | INTRAMUSCULAR | Status: AC
  Administered 2020-05-28: 1000 ug via INTRAMUSCULAR

## 2020-05-28 NOTE — Patient Instructions (Signed)

## 2020-05-30 LAB — CULTURE, URINE COMPREHENSIVE
MICRO NUMBER:: 11729734
SPECIMEN QUALITY:: ADEQUATE

## 2020-05-30 LAB — MICROALBUMIN / CREATININE URINE RATIO
Creatinine, Urine: 268 mg/dL (ref 20–320)
Microalb Creat Ratio: 39 mcg/mg creat — ABNORMAL HIGH (ref <30)
Microalb, Ur: 10.4 mg/dL

## 2020-06-04 ENCOUNTER — Telehealth: Payer: Self-pay

## 2020-06-04 NOTE — Telephone Encounter (Signed)
Sean Phelps gave results to wife 05/31/2020

## 2020-06-04 NOTE — Telephone Encounter (Signed)
Copied from CRM [id number]. Topic: General - Other >> Jun 04, 2020 10:38 AM Gwenlyn Fudge wrote: Reason for CRM: Pts wife called and is requesting to have pts most recent lab results. Please advise.

## 2020-06-07 ENCOUNTER — Ambulatory Visit (INDEPENDENT_AMBULATORY_CARE_PROVIDER_SITE_OTHER): Payer: Medicare HMO

## 2020-06-07 ENCOUNTER — Telehealth: Payer: Self-pay | Admitting: Family Medicine

## 2020-06-07 ENCOUNTER — Other Ambulatory Visit: Payer: Self-pay

## 2020-06-07 DIAGNOSIS — Z0000 Encounter for general adult medical examination without abnormal findings: Secondary | ICD-10-CM | POA: Diagnosis not present

## 2020-06-07 DIAGNOSIS — F331 Major depressive disorder, recurrent, moderate: Secondary | ICD-10-CM

## 2020-06-07 MED ORDER — CITALOPRAM HYDROBROMIDE 10 MG PO TABS: 10.0000 mg | ORAL_TABLET | Freq: Every day | ORAL | 0 refills | Status: DC

## 2020-06-07 NOTE — Patient Instructions (Signed)
Sean Phelps , Thank you for taking time to come for your Medicare Wellness Visit. I appreciate your ongoing commitment to your health goals. Please review the following plan we discussed and let me know if I can assist you in the future.   Screening recommendations/referrals: Colonoscopy: no longer required Recommended yearly ophthalmology/optometry visit for glaucoma screening and checkup Recommended yearly dental visit for hygiene and checkup  Vaccinations: Influenza vaccine: done 11/15/19 Pneumococcal vaccine: done 07/13/15 Tdap vaccine: done 07/13/15 Shingles vaccine: Shingrix discussed. Please contact your pharmacy for coverage information.  Covid-19:  Done 04/23/19 & 05/21/19  Advanced directives: Advance directive discussed with you today. Even though you declined this today please call our office should you change your mind and we can give you the proper paperwork for you to fill out.  Conditions/risks identified: Recommend increasing physical activity   Next appointment: Follow up in one year for your annual wellness visit.   Preventive Care 37 Years and Older, Male Preventive care refers to lifestyle choices and visits with your health care provider that can promote health and wellness. What does preventive care include?  A yearly physical exam. This is also called an annual well check.  Dental exams once or twice a year.  Routine eye exams. Ask your health care provider how often you should have your eyes checked.  Personal lifestyle choices, including:  Daily care of your teeth and gums.  Regular physical activity.  Eating a healthy diet.  Avoiding tobacco and drug use.  Limiting alcohol use.  Practicing safe sex.  Taking low doses of aspirin every day.  Taking vitamin and mineral supplements as recommended by your health care provider. What happens during an annual well check? The services and screenings done by your health care provider during your annual well  check will depend on your age, overall health, lifestyle risk factors, and family history of disease. Counseling  Your health care provider may ask you questions about your:  Alcohol use.  Tobacco use.  Drug use.  Emotional well-being.  Home and relationship well-being.  Sexual activity.  Eating habits.  History of falls.  Memory and ability to understand (cognition).  Work and work Astronomer. Screening  You may have the following tests or measurements:  Height, weight, and BMI.  Blood pressure.  Lipid and cholesterol levels. These may be checked every 5 years, or more frequently if you are over 45 years old.  Skin check.  Lung cancer screening. You may have this screening every year starting at age 46 if you have a 30-pack-year history of smoking and currently smoke or have quit within the past 15 years.  Fecal occult blood test (FOBT) of the stool. You may have this test every year starting at age 15.  Flexible sigmoidoscopy or colonoscopy. You may have a sigmoidoscopy every 5 years or a colonoscopy every 10 years starting at age 3.  Prostate cancer screening. Recommendations will vary depending on your family history and other risks.  Hepatitis C blood test.  Hepatitis B blood test.  Sexually transmitted disease (STD) testing.  Diabetes screening. This is done by checking your blood sugar (glucose) after you have not eaten for a while (fasting). You may have this done every 1-3 years.  Abdominal aortic aneurysm (AAA) screening. You may need this if you are a current or former smoker.  Osteoporosis. You may be screened starting at age 43 if you are at high risk. Talk with your health care provider about your test results, treatment  options, and if necessary, the need for more tests. Vaccines  Your health care provider may recommend certain vaccines, such as:  Influenza vaccine. This is recommended every year.  Tetanus, diphtheria, and acellular  pertussis (Tdap, Td) vaccine. You may need a Td booster every 10 years.  Zoster vaccine. You may need this after age 82.  Pneumococcal 13-valent conjugate (PCV13) vaccine. One dose is recommended after age 42.  Pneumococcal polysaccharide (PPSV23) vaccine. One dose is recommended after age 62. Talk to your health care provider about which screenings and vaccines you need and how often you need them. This information is not intended to replace advice given to you by your health care provider. Make sure you discuss any questions you have with your health care provider. Document Released: 03/09/2015 Document Revised: 10/31/2015 Document Reviewed: 12/12/2014 Elsevier Interactive Patient Education  2017 [place] Prevention in the Home Falls can cause injuries. They can happen to people of all ages. There are many things you can do to make your home safe and to help prevent falls. What can I do on the outside of my home?  Regularly fix the edges of walkways and driveways and fix any cracks.  Remove anything that might make you trip as you walk through a door, such as a raised step or threshold.  Trim any bushes or trees on the path to your home.  Use bright outdoor lighting.  Clear any walking paths of anything that might make someone trip, such as rocks or tools.  Regularly check to see if handrails are loose or broken. Make sure that both sides of any steps have handrails.  Any raised decks and porches should have guardrails on the edges.  Have any leaves, snow, or ice cleared regularly.  Use sand or salt on walking paths during winter.  Clean up any spills in your garage right away. This includes oil or grease spills. What can I do in the bathroom?  Use night lights.  Install grab bars by the toilet and in the tub and shower. Do not use towel bars as grab bars.  Use non-skid mats or decals in the tub or shower.  If you need to sit down in the shower, use a plastic,  non-slip stool.  Keep the floor dry. Clean up any water that spills on the floor as soon as it happens.  Remove soap buildup in the tub or shower regularly.  Attach bath mats securely with double-sided non-slip rug tape.  Do not have throw rugs and other things on the floor that can make you trip. What can I do in the bedroom?  Use night lights.  Make sure that you have a light by your bed that is easy to reach.  Do not use any sheets or blankets that are too big for your bed. They should not hang down onto the floor.  Have a firm chair that has side arms. You can use this for support while you get dressed.  Do not have throw rugs and other things on the floor that can make you trip. What can I do in the kitchen?  Clean up any spills right away.  Avoid walking on wet floors.  Keep items that you use a lot in easy-to-reach places.  If you need to reach something above you, use a strong step stool that has a grab bar.  Keep electrical cords out of the way.  Do not use floor polish or wax that makes floors slippery.  If you must use wax, use non-skid floor wax.  Do not have throw rugs and other things on the floor that can make you trip. What can I do with my stairs?  Do not leave any items on the stairs.  Make sure that there are handrails on both sides of the stairs and use them. Fix handrails that are broken or loose. Make sure that handrails are as long as the stairways.  Check any carpeting to make sure that it is firmly attached to the stairs. Fix any carpet that is loose or worn.  Avoid having throw rugs at the top or bottom of the stairs. If you do have throw rugs, attach them to the floor with carpet tape.  Make sure that you have a light switch at the top of the stairs and the bottom of the stairs. If you do not have them, ask someone to add them for you. What else can I do to help prevent falls?  Wear shoes that:  Do not have high heels.  Have rubber  bottoms.  Are comfortable and fit you well.  Are closed at the toe. Do not wear sandals.  If you use a stepladder:  Make sure that it is fully opened. Do not climb a closed stepladder.  Make sure that both sides of the stepladder are locked into place.  Ask someone to hold it for you, if possible.  Clearly mark and make sure that you can see:  Any grab bars or handrails.  First and last steps.  Where the edge of each step is.  Use tools that help you move around (mobility aids) if they are needed. These include:  Canes.  Walkers.  Scooters.  Crutches.  Turn on the lights when you go into a dark area. Replace any light bulbs as soon as they burn out.  Set up your furniture so you have a clear path. Avoid moving your furniture around.  If any of your floors are uneven, fix them.  If there are any pets around you, be aware of where they are.  Review your medicines with your doctor. Some medicines can make you feel dizzy. This can increase your chance of falling. Ask your doctor what other things that you can do to help prevent falls. This information is not intended to replace advice given to you by your health care provider. Make sure you discuss any questions you have with your health care provider. Document Released: 12/07/2008 Document Revised: 07/19/2015 Document Reviewed: 03/17/2014 Elsevier Interactive Patient Education  2017 [employer].

## 2020-06-07 NOTE — Progress Notes (Signed)
Subjective:   Sean Phelps is a 80 y.o. male who presents for Medicare Annual/Subsequent preventive examination.  Virtual Visit via Telephone Note  I connected with  Sean Phelps on 06/07/20 at 10:40 AM EDT by telephone and verified that I am speaking with the correct person using two identifiers.  Location: Patient: home Provider: [facility] Persons participating in the virtual visit: patient & wife Sean Phelps [employer] Health Advisor   I discussed the limitations, risks, security and privacy concerns of performing an evaluation and management service by telephone and the availability of in person appointments. The patient expressed understanding and agreed to proceed.  Interactive audio and video telecommunications were attempted between this nurse and patient, however failed, due to patient having technical difficulties OR patient did not have access to video capability.  We continued and completed visit with audio only.  Some vital signs may be absent or patient reported.   Sean Littler, LPN    Review of Systems     Cardiac Risk Factors include: advanced age (>60men, >46 women);dyslipidemia;male gender;hypertension     Objective:    There were no vitals filed for this visit. There is no height or weight on file to calculate BMI.  Advanced Directives 06/07/2020 05/15/2020 06/02/2019 12/20/2016 11/12/2016 07/20/2016 03/19/2016  Does Patient Have a Medical Advance Directive? No No No No Yes No No  Would patient like information on creating a medical advance directive? No - Patient declined No - Patient declined No - Patient declined No - Patient declined - - -    Current Medications (verified) Outpatient Encounter Medications as of 06/07/2020  Medication Sig  . aspirin 81 MG tablet Take 81 mg by mouth daily.  . citalopram (CELEXA) 10 MG tablet Take 1 tablet by mouth once daily  . donepezil (ARICEPT) 10 MG tablet Take 10 mg by mouth at bedtime.  Marland Kitchen losartan (COZAAR) 25 MG tablet Take  1 tablet (25 mg total) by mouth daily.  . memantine (NAMENDA) 10 MG tablet Take 10 mg by mouth 2 (two) times daily.   No facility-administered encounter medications on file as of 06/07/2020.    Allergies (verified) Brinzolamide-brimonidine   History: Past Medical History:  Diagnosis Date  . Asthma   . Brachial neuritis   . Brachial neuritis   . Elevated cholesterol 07/20/14  . Hypertension   . Lumbago   . Thoracic aortic atherosclerosis (HCC) 07/20/2016   Chest xray May 2018   Past Surgical History:  Procedure Laterality Date  . CATARACT EXTRACTION Right   . EYE SURGERY     Family History  Problem Relation Age of Onset  . Leukemia Father   . Emphysema Father   . Alcohol abuse Mother    Social History   Socioeconomic History  . Marital status: Married    Spouse name: Sean Phelps   . Number of children: 4  . Years of education: Not on file  . Highest education level: Not on file  Occupational History  . Not on file  Tobacco Use  . Smoking status: Former Smoker    Years: 25.00    Types: Cigarettes    Quit date: 12/25/1965    Years since quitting: 54.4  . Smokeless tobacco: Never Used  Vaping Use  . Vaping Use: Never used  Substance and Sexual Activity  . Alcohol use: No    Alcohol/week: 0.0 standard drinks  . Drug use: No  . Sexual activity: Never  Other Topics Concern  . Not on file  Social  History Narrative   Married   Unemployed since eye problems Summer 2017   Social Determinants of Health   Financial Resource Strain: Medium Risk  . Difficulty of Paying Living Expenses: Somewhat hard  Food Insecurity: No Food Insecurity  . Worried About Programme researcher, broadcasting/film/videounning Out of Food in the Last Year: Never true  . Ran Out of Food in the Last Year: Never true  Transportation Needs: No Transportation Needs  . Lack of Transportation (Medical): No  . Lack of Transportation (Non-Medical): No  Physical Activity: Inactive  . Days of Exercise per Week: 0 days  . Minutes of Exercise  per Session: 0 min  Stress: No Stress Concern Present  . Feeling of Stress : Only a little  Social Connections: Moderately Integrated  . Frequency of Communication with Friends and Family: Three times a week  . Frequency of Social Gatherings with Friends and Family: Once a week  . Attends Religious Services: More than 4 times per year  . Active Member of Clubs or Organizations: No  . Attends BankerClub or Organization Meetings: Never  . Marital Status: Married    Tobacco Counseling Counseling given: Not Answered   Clinical Intake:  Pre-visit preparation completed: Yes  Pain : No/denies pain     Nutritional Risks: None Diabetes: No  How often do you need to have someone help you when you read instructions, pamphlets, or other written materials from your doctor or pharmacy?: 4 - Often    Interpreter Needed?: No  Information entered by :: Sean LittlerKasey Serenity Batley LPN   Activities of Daily Living In your present state of health, do you have any difficulty performing the following activities: 06/07/2020 05/28/2020  Hearing? Malvin JohnsY Y  Vision? Y Y  Difficulty concentrating or making decisions? Malvin JohnsY Y  Walking or climbing stairs? N N  Dressing or bathing? Y Y  Doing errands, shopping? Malvin JohnsY Y  Preparing Food and eating ? Y -  Using the Toilet? N -  In the past six months, have you accidently leaked urine? Y -  Comment wears depends for protection sometimes -  Do you have problems with loss of bowel control? Y -  Managing your Medications? Y -  Managing your Finances? Y -  Housekeeping or managing your Housekeeping? Y -  Some recent data might be hidden    Patient Care Team: Sean Phelps, [name], MD as PCP - General (Family Medicine) [place], Sean Phelps, [name] as Social Worker  Indicate any recent Medical Services you may have received from other than [facility] providers in the past year (date may be approximate).     Assessment:   This is a routine wellness examination for Sean Phelps.  Hearing/Vision screen   Hearing Screening   125Hz  250Hz  500Hz  1000Hz  2000Hz  3000Hz  4000Hz  6000Hz  8000Hz   Right ear:           Left ear:           Comments: Pt has hearing difficulty. Declines hearing aids.  Vision Screening Comments: Past due for eye exam. Needs to reestablish with new provider.   Dietary issues and exercise activities discussed: Current Exercise Habits: The patient does not participate in regular exercise at present, Exercise limited by: psychological condition(s)  Goals    .  Make and Keep All Appointments      Follow Up Date 01/26/2020   - keep a calendar with appointment dates    Why is this important?   Part of staying healthy is seeing the doctor for follow-up care.  If you  forget your appointments, there are some things you can do to stay on track.    Notes: Per patient's spouse, she mixed up the time of patient's last appointment with the neurologist. Her plan now is to make sure she writes down every appointment date and time to ensure she does not miss any dates     .  per patient's spouse "I would like him to remain active as much as possible" (pt-stated)      Follow Up Date 01/26/20   - spend time outdoors at least 3 times a week    Why is this important?   Having a long-term illness can be scary.  It can also be stressful for you and your caregiver.  These steps may help.    Notes: Patient's spouse continues to  encourage patient to participate in outdoor activities. Patient continues to decline participation in Day Programs and outdoor activities without his spouse being present . [facility] remain available when needed    .  per patient's spouse "I would like to consider finding help in the community even though he may not go" (pt-stated)      Follow Up Date 12/0122021   - Followed up on recommendation for patient to consider the Adult Day Program which will be available next month    Patient's wife willing to consider this, however patient refuses Adult Day  Program at this time    Collaboration phone call to [place], to discuss possible custodial care hours. It was confirmed that patient does not have this benefit. It was discussed with patient's spouse, that she continue to allow assistance with patient's care by family to avoid caregiver strain.         Depression Screen PHQ 2/9 Scores 06/07/2020 05/28/2020 03/07/2020 12/02/2019 11/15/2019 09/23/2019 09/14/2019  PHQ - 2 Score 0 0 3 3 6  - -  PHQ- 9 Score - - 6 24 - - -  Exception Documentation - - - Medical reason - Other- indicate reason in comment box Other- indicate reason in comment box  Not completed - - - - - - Unable to assess    Fall Risk Fall Risk  06/07/2020 05/28/2020 03/07/2020 12/02/2019 11/15/2019  Falls in the past year? 0 0 1 0 1  Number falls in past yr: 0 0 1 0 1  Injury with Fall? 0 0 0 0 0  Comment - - - - -  Risk for fall due to : No Fall Risks - - - History of fall(s);Impaired vision  Follow up Falls prevention discussed - - - -    FALL RISK PREVENTION PERTAINING TO THE HOME:  Any stairs in or around the home? Yes If so, are there any without handrails? No  Home free of loose throw rugs in walkways, pet beds, electrical cords, etc? Yes  Adequate lighting in your home to reduce risk of falls? Yes   ASSISTIVE DEVICES UTILIZED TO PREVENT FALLS:  Life alert? No  Use of a cane, walker or w/c? No  Grab bars in the bathroom? No  Shower chair or bench in shower? No  Elevated toilet seat or a handicapped toilet? Yes   TIMED UP AND GO:  Was the test performed? No . Telephonic visit.   Cognitive Function: Patient has current diagnosis of cognitive impairment. Patient is followed by neurology for ongoing assessment.    MMSE - Mini Mental State Exam 08/06/2016  Orientation to time 0  Orientation to Place 4  Registration 3  Attention/  Calculation 0  Recall 3  Language- name 2 objects 2  Language- repeat 1  Language- follow 3 step command 3  Language- read & follow  direction 1  Write a sentence 0  Copy design 0  Total score 17        Immunizations Immunization History  Administered Date(s) Administered  . Fluad Quad(high Dose 65+) 12/01/2018, 11/15/2019  . Influenza, High Dose Seasonal PF 12/26/2015, 01/28/2017, 11/30/2017  . Influenza-Unspecified 11/01/2013  . Moderna Sars-Covid-2 Vaccination 04/23/2019, 05/21/2019  . Pneumococcal Conjugate-13 08/31/2013  . Pneumococcal Polysaccharide-23 07/13/2015  . Tdap 07/13/2015    TDAP status: Up to date  Flu Vaccine status: Up to date  Pneumococcal vaccine status: Up to date  Covid-19 vaccine status: Completed vaccines  Qualifies for Shingles Vaccine? Yes   Zostavax completed No   Shingrix Completed?: No.    Education has been provided regarding the importance of this vaccine. Patient has been advised to call insurance company to determine out of pocket expense if they have not yet received this vaccine. Advised may also receive vaccine at local pharmacy or Health Dept. Verbalized acceptance and understanding.  Screening Tests Health Maintenance  Topic Date Due  . COVID-19 Vaccine (3 - Booster for Moderna series) 06/13/2020 (Originally 11/21/2019)  . INFLUENZA VACCINE  09/24/2020  . TETANUS/TDAP  07/12/2025  . PNA vac Low Risk Adult  Completed  . HPV VACCINES  Aged Out    Health Maintenance  There are no preventive care reminders to display for this patient.  Colorectal cancer screening: No longer required.   Lung Cancer Screening: (Low Dose CT Chest recommended if Age 77-80 years, 30 pack-year currently smoking OR have quit w/in 15years.) does not qualify.   Additional Screening:  Hepatitis C Screening: does not qualify.  Vision Screening: Recommended annual ophthalmology exams for early detection of glaucoma and other disorders of the eye. Is the patient up to date with their annual eye exam?  No  Who is the provider or what is the name of the office in which the patient attends  annual eye exams? Not established If pt is not established with a provider, would they like to be referred to a provider to establish care? No .   Dental Screening: Recommended annual dental exams for proper oral hygiene  Community Resource Referral / Chronic Care Management: CRR required this visit?  No   CCM required this visit?  No      Plan:     I have personally reviewed and noted the following in the patient's chart:   . Medical and social history . Use of alcohol, tobacco or illicit drugs  . Current medications and supplements . Functional ability and status . Nutritional status . Physical activity . Advanced directives . List of other physicians . Hospitalizations, surgeries, and ER visits in previous 12 months . Vitals . Screenings to include cognitive, depression, and falls . Referrals and appointments  In addition, I have reviewed and discussed with patient certain preventive protocols, quality metrics, and best practice recommendations. A written personalized care plan for preventive services as well as general preventive health recommendations were provided to patient.     Sean Littler, LPN   06/02/8117   Nurse Notes: none

## 2020-06-07 NOTE — Telephone Encounter (Signed)
Pt has changed pharmacies and has had one Rx transferred to [facility] but they need an Rx for citalopram (CELEXA) 10 MG tablet  / please advise and send  [facility] - [place], [place] - [address] Phone:  [phone]  Fax:  [phone]     They also want to make sure that his med list is current

## 2020-06-08 ENCOUNTER — Other Ambulatory Visit: Payer: Self-pay

## 2020-06-08 ENCOUNTER — Other Ambulatory Visit: Payer: Self-pay | Admitting: Family Medicine

## 2020-06-08 DIAGNOSIS — E440 Moderate protein-calorie malnutrition: Secondary | ICD-10-CM

## 2020-06-08 DIAGNOSIS — I700 Atherosclerosis of aorta: Secondary | ICD-10-CM

## 2020-06-08 DIAGNOSIS — H548 Legal blindness, as defined in USA: Secondary | ICD-10-CM

## 2020-06-08 NOTE — Telephone Encounter (Signed)
I would be happy to speak with him and his family to review his medications. Can we place a referral for CCM for him?   Thanks, Garey Ham, CPP Clinical Pharmacist [facility] [phone]

## 2020-06-11 NOTE — Telephone Encounter (Signed)
Faxed med list  

## 2020-06-11 NOTE — Telephone Encounter (Signed)
[employer] tech with [facility] is calling and also needs current med list for the patient to start getting pillpak. Please fax to [phone]

## 2020-06-12 ENCOUNTER — Telehealth: Payer: Self-pay

## 2020-06-12 ENCOUNTER — Telehealth: Payer: Self-pay | Admitting: *Deleted

## 2020-06-12 NOTE — Chronic Care Management (AMB) (Signed)
  Chronic Care Management   Note  06/12/2020 Name: JERRAL MCCAULEY MRN: [id number] DOB: 14-Feb-1941  AARAV BURGETT is a 80 y.o. year old male who is a primary care patient of Steele Sizer, MD. I reached out to Burgess Amor by phone today in response to a referral sent by Mr. Rithy Mandley Grimley's PCP, Dr. Ancil Boozer.     Mr. Shugars was given information about Chronic Care Management services today including:  1. CCM service includes personalized support from designated clinical staff supervised by his physician, including individualized plan of care and coordination with other care providers 2. 24/7 contact phone numbers for assistance for urgent and routine care needs. 3. Service will only be billed when office clinical staff spend 20 minutes or more in a month to coordinate care. 4. Only one practitioner may furnish and bill the service in a calendar month. 5. The patient may stop CCM services at any time (effective at the end of the month) by phone call to the office staff. 6. The patient will be responsible for cost sharing (co-pay) of up to 20% of the service fee (after annual deductible is met).  Spouse Rhoderick Farrel  verbally agreed to assistance and services provided by embedded care coordination/care management team today.  Follow up plan: Telephone appointment with care management team member scheduled for:  PharmD 6/43/5391 [facility] 03/28/5832 Licensed Clinical SW 06/29/2020   [place] [facility]

## 2020-06-12 NOTE — Progress Notes (Signed)
    Chronic Care Management Pharmacy Assistant   Name: Sean Phelps  MRN: [id number] DOB: 03-12-1940  Reason for Encounter: Chart review for CPP on 06/13/2020.   Conditions to be addressed/monitored: HTN, HLD, Depression, Dementia and Thoracic aortic atherosclerosis,THS,B12 deficiency, Benign enlargement of prostate.  Primary concerns for visit include: Patient wife states she is concern about the patient sleeping almost the whole  day.   Recent office visits:  06/07/2020 [name] LPN-PCP Office -  No medication change indicated 05/28/2020 Dr. Danna Hefty MD PCP - Patient not taking Divalproex sodium 125 mg,Mirtanzapine 15 mg. 03/07/2020 Dr. Danna Hefty MD PCP - Change citalopram 20 mg to 10 mg daily.Stop bupropion 150 mg 01/26/2020 Chrystal Land CCM- No medication change indicated 12/26/2019 Chrystal Land CCM- no medication change indicated 12/23/2019 Chrystal Land CCM - No medication change indicated  Recent consult visits:  05/01/2020 Theora Master (Neurology)- Increase Namenda to 10mg  twice daily  Hospital visits:  Medication Reconciliation was completed by comparing discharge summary, patient's EMR and Pharmacy list, and upon discussion with patient.  Admitted to the hospital on 05/15/2020 due to Abdominal pain. Discharge date was 05/15/2020. Discharged from [facility].    New?Medications Started at [facility] Discharge:?? -None ID  Medication Changes at Hospital Discharge: -None ID  Medications Discontinued at Hospital Discharge: -None ID  Medications that remain the same after Hospital Discharge:??  -All other medications will remain the same.    Have you seen any other providers since your last visit? no Any changes in your medications or health? no Any side effects from any medications? No  Patient wife states patient sleeps a lot, and she is unsure if this is a side effect from any of his medications. Do you have an symptoms or problems not  managed by your medications? no Any concerns about your health right now? Yes  Sleep Has your provider asked that you check blood pressure, blood sugar, or follow special diet at home? No  Patient wife states she rarely checks the patient blood pressure but when she does it ranges around 116/60. Do you get any type of exercise on a regular basis? no Can you think of a goal you would like to reach for your health? None ID Do you have any problems getting your medications? No  Patient wife states she switch pharmacy over to [facility].Patient wife states they are going to start the patient on pill pack sytem to help keep organize. Is there anything that you would like to discuss during the appointment?   Sleep  Please bring medications and supplements to appointment   Medications: Outpatient Encounter Medications as of 06/12/2020  Medication Sig Note  . aspirin 81 MG tablet Take 81 mg by mouth daily.   . citalopram (CELEXA) 10 MG tablet Take 1 tablet (10 mg total) by mouth daily.   06/14/2020 donepezil (ARICEPT) 10 MG tablet Take 10 mg by mouth at bedtime.   Marland Kitchen losartan (COZAAR) 25 MG tablet Take 1 tablet (25 mg total) by mouth daily.   . memantine (NAMENDA) 10 MG tablet Take 10 mg by mouth 2 (two) times daily. 06/07/2020: Pt taking 5 mg in AM and 10 mg QHS   No facility-administered encounter medications on file as of 06/12/2020.    Star Rating Drugs: Losartan 25 mg last filled on 05/14/2020 for 30 day supply at [facility].  [facility] [employer] [place] Clinical Pharmacist Assistant [phone]

## 2020-06-13 ENCOUNTER — Ambulatory Visit: Payer: Medicare HMO

## 2020-06-13 DIAGNOSIS — R69 Illness, unspecified: Secondary | ICD-10-CM | POA: Diagnosis not present

## 2020-06-13 DIAGNOSIS — E7800 Pure hypercholesterolemia, unspecified: Secondary | ICD-10-CM | POA: Diagnosis not present

## 2020-06-13 DIAGNOSIS — F329 Major depressive disorder, single episode, unspecified: Secondary | ICD-10-CM | POA: Diagnosis not present

## 2020-06-13 DIAGNOSIS — I10 Essential (primary) hypertension: Secondary | ICD-10-CM | POA: Diagnosis not present

## 2020-06-13 DIAGNOSIS — F331 Major depressive disorder, recurrent, moderate: Secondary | ICD-10-CM

## 2020-06-13 DIAGNOSIS — F0150 Vascular dementia without behavioral disturbance: Secondary | ICD-10-CM

## 2020-06-13 DIAGNOSIS — F0153 Vascular dementia, unspecified severity, with mood disturbance: Secondary | ICD-10-CM

## 2020-06-13 NOTE — Progress Notes (Signed)
Chronic Care Management Pharmacy Note  06/13/2020 Name:  Sean Phelps MRN:  [id number] DOB:  [birth year]  Subjective: Sean Phelps is an 80 y.o. year old male who is a primary patient of Steele Sizer, MD.  The CCM team was consulted for assistance with disease management and care coordination needs.    Engaged with patient by telephone for initial visit in response to provider referral for pharmacy case management and/or care coordination services.   Consent to Services:  The patient was given the following information about Chronic Care Management services today, agreed to services, and gave verbal consent: 1. CCM service includes personalized support from designated clinical staff supervised by the primary care provider, including individualized plan of care and coordination with other care providers 2. 24/7 contact phone numbers for assistance for urgent and routine care needs. 3. Service will only be billed when office clinical staff spend 20 minutes or more in a month to coordinate care. 4. Only one practitioner may furnish and bill the service in a calendar month. 5.The patient may stop CCM services at any time (effective at the end of the month) by phone call to the office staff. 6. The patient will be responsible for cost sharing (co-pay) of up to 20% of the service fee (after annual deductible is met). Patient agreed to services and consent obtained.  Patient Care Team: Steele Sizer, MD as PCP - General (Family Medicine) Vern Claude, LCSW as Social Worker Neldon Labella, RN as Registered Nurse Germaine Pomfret, [facility] (Pharmacist)  Recent office visits: 06/07/2020 Clemetine Marker LPN-PCP Office -  No medication change indicated 05/28/2020 Dr. Ancil Boozer MD PCP - Patient not taking Divalproex sodium 125 mg or Mirtazapine 15 mg. 03/07/2020 Dr. Ancil Boozer MD PCP - Changed citalopram 20 mg to 10 mg daily.Stopped bupropion 150 mg 01/26/2020 Chrystal Land CCM- No medication change  indicated 12/26/2019 Chrystal Land CCM- no medication change indicated 12/23/2019 Chrystal Land CCM - No medication change indicated  Recent consult visits: 05/01/2020 Gurney Maxin (Neurology)- Increase Namenda to 92m twice daily  Hospital visits: Medication Reconciliation was completed by comparing discharge summary, patient's EMR and Pharmacy list, and upon discussion with patient.  Admitted to the hospital on 05/15/2020 due to Abdominal pain. Discharge date was 05/15/2020. [facility]    [facility]  Medication Changes at Hospital Discharge: -None ID  Medications Discontinued at Hospital Discharge: -None ID  Medications that remain the same after Hospital Discharge:??  -All other medications will remain the same.  Objective:  Lab Results  Component Value Date   CREATININE 1.01 05/15/2020   BUN 18 05/15/2020   GFRNONAA >60 05/15/2020   GFRAA 100 06/02/2019   NA 138 05/15/2020   K 4.2 05/15/2020   CALCIUM 9.0 05/15/2020   CO2 28 05/15/2020   GLUCOSE 99 05/15/2020    Lab Results  Component Value Date/Time   HGBA1C 4.8 08/06/2016 11:32 AM   HGBA1C 5.2 07/20/2015 09:33 AM   MICROALBUR 10.4 05/28/2020 11:12 AM    Last diabetic Eye exam: No results found for: HMDIABEYEEXA  Last diabetic Foot exam: No results found for: HMDIABFOOTEX   Lab Results  Component Value Date   CHOL 173 08/06/2016   HDL 90 08/06/2016   LDLCALC 73 08/06/2016   TRIG 50 08/06/2016   CHOLHDL 1.9 08/06/2016    Hepatic Function Latest Ref Rng & Units 05/15/2020 06/02/2019 12/01/2018  Total Protein 6.5 - 8.1 g/dL 7.4 7.1 7.4  Albumin 3.5 - 5.0 g/dL 3.9 - -  AST 15 - 41 U/L _0 ALT 0 - 44 U/L 12 10 8(L)  Alk Phosphatase 38 - 126 U/L 77 - -  Total Bilirubin 0.3 - 1.2 mg/dL 0.7 0.7 0.8    Lab Results  Component Value Date/Time   TSH 1.26 11/30/2017 10:29 AM   TSH 1.880 12/20/2016 12:10 AM   TSH  1.00 08/06/2016 11:32 AM    CBC Latest Ref Rng & Units 05/15/2020 06/02/2019 12/01/2018  WBC 4.0 - 10.5 K/uL 4.5 7.3 4.3  Hemoglobin 13.0 - 17.0 g/dL 12.6(L) 12.2(L) 13.1(L)  Hematocrit 39.0 - 52.0 % 39.8 37.4(L) 40.1  Platelets 150 - 400 K/uL 199 186 170    No results found for: VD25OH  Clinical ASCVD: Yes  The ASCVD Risk score Mikey Bussing DC Jr., et al., 2013) failed to calculate for the following reasons:   The 2013 ASCVD risk score is only valid for ages 45 to 43    Depression screen PHQ 2/9 06/07/2020 05/28/2020 03/07/2020  Decreased Interest 0 0 0  Down, Depressed, Hopeless 0 0 3  PHQ - 2 Score 0 0 3  Altered sleeping - - 0  Tired, decreased energy - - 0  Change in appetite - - 0  Feeling bad or failure about yourself  - - 0  Trouble concentrating - - 0  Moving slowly or fidgety/restless - - 3  Suicidal thoughts - - 0  PHQ-9 Score - - 6  Difficult doing work/chores - - Very difficult  Some recent data might be hidden    Social History   Tobacco Use  Smoking Status Former Smoker  . Years: 25.00  . Types: Cigarettes  . Quit date: 12/25/1965  . Years since quitting: 54.5  Smokeless Tobacco Never Used   BP Readings from Last 3 Encounters:  05/28/20 116/62  05/16/20 (!) 146/73  03/07/20 128/76   Pulse Readings from Last 3 Encounters:  05/28/20 62  05/16/20 63  03/07/20 68   Wt Readings from Last 3 Encounters:  05/28/20 104 lb 3.2 oz (47.3 kg)  05/15/20 105 lb (47.6 kg)  03/07/20 107 lb 11.2 oz (48.9 kg)   BMI Readings from Last 3 Encounters:  05/28/20 19.69 kg/m  05/15/20 19.20 kg/m  03/07/20 20.35 kg/m    Assessment/Interventions: Review of patient past medical history, allergies, medications, health status, including review of consultants reports, laboratory and other test data, was performed as part of comprehensive evaluation and provision of chronic care management services.   SDOH:  (Social Determinants of Health) assessments and interventions performed:  Yes SDOH Interventions   Flowsheet Row Most Recent Value  SDOH Interventions   Financial Strain Interventions Intervention Not Indicated     SDOH Screenings   Alcohol Screen: Low Risk   . Last Alcohol Screening Score (AUDIT): 0  Depression (PHQ2-9): Low Risk   . PHQ-2 Score: 0  Financial Resource Strain: Low Risk   . Difficulty of Paying Living Expenses: Not hard at all  Food Insecurity: No Food Insecurity  . Worried About Charity fundraiser in the Last Year: Never true  . Ran Out of Food in the Last Year: Never true  Housing: Low Risk   . Last Housing Risk Score: 0  Physical Activity: Inactive  . Days of Exercise per Week: 0 days  . Minutes of Exercise per Session: 0 min  Social Connections: Moderately Integrated  . Frequency of Communication with Friends and Family: Three times  a week  . Frequency of Social Gatherings with Friends and Family: Once a week  . Attends Religious Services: More than 4 times per year  . Active Member of Clubs or Organizations: No  . Attends Archivist Meetings: Never  . Marital Status: Married  Stress: No Stress Concern Present  . Feeling of Stress : Only a little  Tobacco Use: Medium Risk  . Smoking Tobacco Use: Former Smoker  . Smokeless Tobacco Use: Never Used  Transportation Needs: No Transportation Needs  . Lack of Transportation (Medical): No  . Lack of Transportation (Non-Medical): No    CCM Care Plan  Allergies  Allergen Reactions  . Brinzolamide-Brimonidine Other (See Comments)    Red and itchy    Medications Reviewed Today    Reviewed by Germaine Pomfret, [facility] (Pharmacist) on 06/13/20 at [phone]  Med List Status: <None>  Medication Order Taking? Sig Documenting Provider Last Dose Status Informant  aspirin 81 MG tablet [id number] Yes Take 81 mg by mouth daily. [provider] Taking Active Child  citalopram (CELEXA) 10 MG tablet [id number] Yes Take 1 tablet (10 mg total) by mouth daily. Steele Sizer, MD  Taking Active   donepezil (ARICEPT) 10 MG tablet [id number] Yes Take 10 mg by mouth at bedtime. Anabel Bene, MD Taking Active   losartan (COZAAR) 25 MG tablet [id number] Yes Take 1 tablet (25 mg total) by mouth daily. Steele Sizer, MD Taking Active   memantine [facility]) 10 MG tablet [id number] Yes Take 10 mg by mouth 2 (two) times daily. Anabel Bene, MD Taking Active            Med Note Gloris Ham, [place] D   Thu Jun 07, 2020 10:54 AM) Pt taking 5 mg in AM and 10 mg QHS          Patient Active Problem List   Diagnosis Date Noted  . B12 deficiency 05/07/2020  . Delusions ([place]) 05/07/2020  . Hallucination 05/07/2020  . TSH (thyroid-stimulating hormone deficiency) 05/07/2020  . Moderate dementia, with behavioral disturbance ([place]) 01/20/2017  . Loss of memory 12/03/2016  . Thoracic aortic atherosclerosis ([place]) 07/20/2016  . Corneal edema of right eye 05/19/2016  . Pseudophakia of right eye 04/09/2016  . Status post cataract extraction and insertion of intraocular lens of left eye 04/09/2016  . Primary open angle glaucoma of right eye, severe stage 08/10/2015  . Status post eye surgery 08/10/2015  . Recurrent major depression-severe ([place]) 07/13/2015  . Hypertension, benign 07/13/2015  . Hyperlipidemia 07/13/2015  . Cataract 07/13/2015  . Abnormal brain MRI 07/13/2015  . Legally blind in right eye, as defined in [place] 07/13/2015  . Dementia, vascular, with depression ([place]) 09/05/2014  . Benign enlargement of prostate 03/22/2009    Immunization History  Administered Date(s) Administered  . Fluad Quad(high Dose 65+) 12/01/2018, 11/15/2019  . Influenza, High Dose Seasonal PF 12/26/2015, 01/28/2017, 11/30/2017  . Influenza-Unspecified 11/01/2013  . Moderna Sars-Covid-2 Vaccination 04/23/2019, 05/21/2019  . Pneumococcal Conjugate-13 08/31/2013  . Pneumococcal Polysaccharide-23 07/13/2015  . Tdap 07/13/2015    Conditions to be addressed/monitored:  Hypertension,  Hyperlipidemia, Depression and Dementia  Care Plan : General Pharmacy (Adult)  Updates made by Germaine Pomfret, RPH since 06/13/2020 12:00 AM    Problem: Hypertension, Hyperlipidemia, Depression and Dementia   Priority: High    Long-Range Goal: Patient-Specific Goal   Start Date: 06/13/2020  Expected End Date: 06/13/2021  This Visit's Progress: On track  Priority: High  Note:   Current Barriers:  .  Unable to independently monitor therapeutic efficacy . Unable to self administer medications as prescribed  Pharmacist Clinical Goal(s):  Marland Kitchen Patient will achieve adherence to monitoring guidelines and medication adherence to achieve therapeutic efficacy . maintain control of blood pressure as evidenced by BP less than 140/90  through collaboration with PharmD and provider.   Interventions: . 1:1 collaboration with Steele Sizer, MD regarding development and update of comprehensive plan of care as evidenced by provider attestation and co-signature . Inter-disciplinary care team collaboration (see longitudinal plan of care) . Comprehensive medication review performed; medication list updated in electronic medical record  Hypertension (BP goal <140/90) -Controlled -Current treatment: . Losartan 25 mg daily  -Medications previously tried: NA  -Current home readings: NA -Denies hypotensive/hypertensive symptoms -Educated on Daily salt intake goal < 2300 mg; Importance of home blood pressure monitoring; -Counseled to monitor BP at home 1-2 times monthly, document, and provide log at future appointments -Recommended to continue current medication  Hyperlipidemia: (LDL goal < 100) -Controlled -Current treatment: . Aspirin 81 mg daily  -Medications previously tried: NA  -Educated on Importance of limiting foods high in cholesterol; -Recommended to continue current medication  Depression (Goal: Maintain stable mood) -Controlled -Current treatment: . Citalopram 10 mg daily   -Medications previously tried/failed: NA -Connected with Chrystal Land, LCSW for mental health support -Educated on Benefits of medication for symptom control Benefits of cognitive-behavioral therapy with or without medication -Recommended to continue current medication  Dementia (Goal: Prevent progression of dementia and memory loss) -Not ideally controlled -Current treatment  . Donepezil 10 mg daily  . Memantine 5 mg, 1 tablet AM, 2 tablets PM,  twice daily  -Medications previously tried: NA -Memantine dose decreased by PCP per patient's wife, possibly due to hallucinations. Patient continues to suffer from hallucinations, poor appetite.   -Recommended to continue current medication  Patient Goals/Self-Care Activities . Patient will:  - check blood pressure 1-2 times monthly, document, and provide at future appointments  Follow Up Plan: Telephone follow up appointment with care management team member scheduled for:  06/12/2021 at 9:00 AM      Medication Assistance: [place] at [place].  Patient's preferred pharmacy is:  [facility] [address]), [place] [address] [place]) [place] [address] Phone: [phone] Fax: [place], [place]. [place] [address] Phone: [phone] Fax: [phone]  Uses pill box? Yes Pt endorses 100% compliance  We discussed: Current pharmacy is preferred with insurance plan and patient is satisfied with pharmacy services Patient decided to: Continue current medication management strategy  Care Plan and Follow Up Patient Decision:  Patient agrees to Care Plan and Follow-up.  Plan: Telephone follow up appointment with care management team member scheduled for:  06/12/2021 at 9:00 AM  Doristine Section, [facility] [phone]

## 2020-06-13 NOTE — Patient Instructions (Addendum)
Visit Information It was great speaking with you today!  Please let me know if you have any questions about our visit.  Goals Addressed            This Visit's Progress   . Track and Manage My Blood Pressure-Hypertension       Timeframe:  Long-Range Goal Priority:  High Start Date:  06/13/2020                           Expected End Date:  06/13/2021                     Follow Up Date 09/22/2020   - check blood pressure weekly    Why is this important?    You won't feel high blood pressure, but it can still hurt your blood vessels.   High blood pressure can cause heart or kidney problems. It can also cause a stroke.   Making lifestyle changes like losing a little weight or eating less salt will help.   Checking your blood pressure at home and at different times of the day can help to control blood pressure.   If the doctor prescribes medicine remember to take it the way the doctor ordered.   Call the office if you cannot afford the medicine or if there are questions about it.     Notes:        Patient Care Plan: General Plan of Care (Adult)    Problem Identified: Therapeutic Alliance (General Plan of Care)   Priority: Medium  Onset Date: 11/22/2019    Goal: Therapeutic Alliance Established Completed 12/26/2019  Start Date: 11/23/2019  Expected End Date: 02/21/2020  Recent Progress: On track  Priority: Medium  Note:   Evidence-based guidance:  Avoid value judgments; convey acceptance.  Encourage collaboration with the treatment team.  Establish rapport; develop trust relationship.  Therapist, art.  Provide emotional support; encourage patient to share feelings of anger, fear and anxiety.  Promote self-reliance and autonomy based on age and ability; discourage overprotection.  Use empathy and nonjudgmental, participatory manner.   Notes:    Task: Develop Relationship to Effect Behavior Change Completed 12/27/2019  Due Date: 11/30/2019  Priority: Routine   Note:   Care Management Activities:    - acceptance conveyed - care explained - choices provided - empathic listening provided - rapport fostered - reassurance provided    Notes:    Problem Identified: Health Promotion or Disease Self-Management (General Plan of Care)     Problem Identified: Coping Skills (General Plan of Care)   Priority: Medium  Onset Date: 11/22/2019    Goal: Coping Skills Enhanced Completed 12/26/2019  Start Date: 11/23/2019  Expected End Date: 02/21/2020  Recent Progress: On track  Priority: Medium  Note:   Evidence-based guidance:  Acknowledge, normalize and validate difficulty of making life-long lifestyle changes.  Identify current effective and ineffective coping strategies.  Encourage patient and caregiver participation in care to increase self-esteem, confidence and feelings of control.  Consider alternative and complementary therapy approaches such as meditation, mindfulness or yoga.  Encourage participation in cognitive behavioral therapy to foster a positive identity, increase self-awareness, as well as bolster self-esteem, confidence and self-efficacy.  Discuss spirituality; be present as concerns are identified; encourage journaling, prayer, worship services, meditation or pastoral counseling.  Encourage participation in pleasurable group activities such as hobbies, singing, sports or volunteering).  Encourage the use of mindfulness; refer for training or intensive  intervention.  Consider the use of meditative movement therapy such as tai chi, yoga or qigong.  Promote a regular daily exercise program based on tolerance, ability and patient choice to support positive thinking about disease or aging.   Notes:    Task: Support Psychosocial Response to Risk or Actual Health Condition Completed 12/27/2019  Due Date: 11/30/2019  Priority: Routine  Note:   Care Management Activities:    - caregiver stress acknowledged - current coping strategies  identified - verbalization of feelings encouraged    Notes:    Problem Identified: Quality of Life (General Plan of Care)     Patient Care Plan: General Pharmacy (Adult)    Problem Identified: Hypertension, Hyperlipidemia, Depression and Dementia   Priority: High    Long-Range Goal: Patient-Specific Goal   Start Date: 06/13/2020  Expected End Date: 06/13/2021  This Visit's Progress: On track  Priority: High  Note:   Current Barriers:  . Unable to independently monitor therapeutic efficacy . Unable to self administer medications as prescribed  Pharmacist Clinical Goal(s):  Marland Kitchen Patient will achieve adherence to monitoring guidelines and medication adherence to achieve therapeutic efficacy . maintain control of blood pressure as evidenced by BP less than 140/90  through collaboration with PharmD and provider.   Interventions: . 1:1 collaboration with Steele Sizer, MD regarding development and update of comprehensive plan of care as evidenced by provider attestation and co-signature . Inter-disciplinary care team collaboration (see longitudinal plan of care) . Comprehensive medication review performed; medication list updated in electronic medical record  Hypertension (BP goal <140/90) -Controlled -Current treatment: . Losartan 25 mg daily  -Medications previously tried: NA  -Current home readings: NA -Denies hypotensive/hypertensive symptoms -Educated on Daily salt intake goal < 2300 mg; Importance of home blood pressure monitoring; -Counseled to monitor BP at home 1-2 times monthly, document, and provide log at future appointments -Recommended to continue current medication  Hyperlipidemia: (LDL goal < 100) -Controlled -Current treatment: . Aspirin 81 mg daily  -Medications previously tried: NA  -Educated on Importance of limiting foods high in cholesterol; -Recommended to continue current medication  Depression (Goal: Maintain stable mood) -Controlled -Current  treatment: . Citalopram 10 mg daily  -Medications previously tried/failed: NA -Connected with Chrystal Land, LCSW for mental health support -Educated on Benefits of medication for symptom control Benefits of cognitive-behavioral therapy with or without medication -Recommended to continue current medication  Dementia (Goal: Prevent progression of dementia and memory loss) -Not ideally controlled -Current treatment  . Donepezil 10 mg daily  . Memantine 10 mg, 1 tablet AM, 2 tablets PM,  twice daily  -Medications previously tried: NA -Memantine dose decreased by PCP per patient's wife, possibly due to hallucinations. Patient continues to suffer from hallucinations, poor appetite.   -Recommended to continue current medication  Patient Goals/Self-Care Activities . Patient will:  - check blood pressure 1-2 times monthly, document, and provide at future appointments  Follow Up Plan: Telephone follow up appointment with care management team member scheduled for:  06/12/2021 at 9:00 AM      Patient agreed to services and verbal consent obtained.   The patient verbalized understanding of instructions, educational materials, and care plan provided today and declined offer to receive copy of patient instructions, educational materials, and care plan.   Doristine Section, [facility] [phone]

## 2020-06-15 ENCOUNTER — Other Ambulatory Visit: Payer: Self-pay | Admitting: Family Medicine

## 2020-06-15 DIAGNOSIS — I10 Essential (primary) hypertension: Secondary | ICD-10-CM

## 2020-06-18 DIAGNOSIS — E038 Other specified hypothyroidism: Secondary | ICD-10-CM | POA: Diagnosis not present

## 2020-06-18 DIAGNOSIS — R443 Hallucinations, unspecified: Secondary | ICD-10-CM | POA: Diagnosis not present

## 2020-06-18 DIAGNOSIS — R413 Other amnesia: Secondary | ICD-10-CM | POA: Diagnosis not present

## 2020-06-18 DIAGNOSIS — E538 Deficiency of other specified B group vitamins: Secondary | ICD-10-CM | POA: Diagnosis not present

## 2020-06-18 DIAGNOSIS — R69 Illness, unspecified: Secondary | ICD-10-CM | POA: Diagnosis not present

## 2020-06-22 NOTE — Progress Notes (Signed)
Name: Sean Phelps   MRN: [id number]    DOB: [birth year]   Date:06/25/2020       Progress Note  Subjective  Chief Complaint  Follow Up, he came in with daughter Drinda Buttsnnette and wife   HPI  HTN: he is taking medication daily for bp, no chest pain, no dizzinessor palpitation.No side effects of medication.BP is towards low end of normal , we will try going down on losartan to 12.5 mg to avoid falls. He has microalbuminuria and needs low dose ARB  Dementia: He was very sleepy and was seen by Dr. Malvin JohnsPotter since his last visit with me, he advised to stop Citalopram, Namenda and Aricept, wife states still has his baseline confusion, forgetfulness, but since stopping medication he seems more awake. He is also legally blind   Malnutrition:he has Meals on Wheels and is taking Remeron but wife states he still has poor appetite, hhe continues to lose weight, he eats, has snacks, eating ice cream, but continues to lose weight He has a caregiver with him all the time, discussed high calorie diet , they are trying to follow recommendation   Depression: hard to tell, he was napping during the visit, Phq 9 was filled by wife   Patient Active Problem List   Diagnosis Date Noted  . B12 deficiency 05/07/2020  . Delusions (HCC) 05/07/2020  . Hallucination 05/07/2020  . TSH (thyroid-stimulating hormone deficiency) 05/07/2020  . Moderate dementia, with behavioral disturbance (HCC) 01/20/2017  . Loss of memory 12/03/2016  . Thoracic aortic atherosclerosis (HCC) 07/20/2016  . Corneal edema of right eye 05/19/2016  . Pseudophakia of right eye 04/09/2016  . Status post cataract extraction and insertion of intraocular lens of left eye 04/09/2016  . Primary open angle glaucoma of right eye, severe stage 08/10/2015  . Status post eye surgery 08/10/2015  . Recurrent major depression-severe (HCC) 07/13/2015  . Hypertension, benign 07/13/2015  . Hyperlipidemia 07/13/2015  . Cataract 07/13/2015  . Abnormal  brain MRI 07/13/2015  . Legally blind in right eye, as defined in [place] 07/13/2015  . Dementia, vascular, with depression (HCC) 09/05/2014  . Benign enlargement of prostate 03/22/2009    Past Surgical History:  Procedure Laterality Date  . CATARACT EXTRACTION Right   . EYE SURGERY      Family History  Problem Relation Age of Onset  . Leukemia Father   . Emphysema Father   . Alcohol abuse Mother     Social History   Tobacco Use  . Smoking status: Former Smoker    Years: 25.00    Types: Cigarettes    Quit date: 12/25/1965    Years since quitting: 54.5  . Smokeless tobacco: Never Used  Substance Use Topics  . Alcohol use: No    Alcohol/week: 0.0 standard drinks     Current Outpatient Medications:  .  aspirin 81 MG tablet, Take 81 mg by mouth daily., Disp: , Rfl:  .  citalopram (CELEXA) 10 MG tablet, Take 1 tablet (10 mg total) by mouth daily., Disp: 30 tablet, Rfl: 0 .  donepezil (ARICEPT) 10 MG tablet, Take 10 mg by mouth at bedtime., Disp: , Rfl:  .  losartan (COZAAR) 25 MG tablet, Take 1 tablet by mouth once daily, Disp: 90 tablet, Rfl: 0 .  memantine (NAMENDA) 10 MG tablet, Take 10 mg by mouth 2 (two) times daily., Disp: , Rfl:   Allergies  Allergen Reactions  . Brinzolamide-Brimonidine Other (See Comments)    Red and itchy    I  personally reviewed active problem list, medication list, allergies, family history, social history, health maintenance with the patient/caregiver today.   ROS  Ten systems reviewed and is negative except as mentioned in HPI   Objective  Vitals:   06/25/20 1041  BP: 110/62  Pulse: 82  Resp: 16  Temp: 98.1 F (36.7 C)  TempSrc: Oral  SpO2: 98%  Weight: 102 lb (46.3 kg)  Height: 5\' 1"  (1.549 m)    Body mass index is 19.27 kg/m.  Physical Exam  Constitutional: Patient appears well-developed and malnourished.  No distress.  HEENT: head atraumatic, normocephalic, pupils equal and reactive to light,neck  supple Cardiovascular: Normal rate, regular rhythm and normal heart sounds.  No murmur heard. No BLE edema. Pulmonary/Chest: Effort normal and breath sounds normal. No respiratory distress. Abdominal: Soft.  There is no tenderness. Psychiatric: Patient was sleeping during the visit, when we woke him up , he woke up when I called his name, he was not oriented to place, he could not recognize his daughter, said the name of the other daughter -   Recent Results (from the past 2160 hour(s))  Lipase, blood     Status: None   Collection Time: 05/15/20  8:59 PM  Result Value Ref Range   Lipase 31 11 - 51 U/L    Comment: Performed at [facility], [address] [place], [place]  Comprehensive metabolic panel     Status: None   Collection Time: 05/15/20  8:59 PM  Result Value Ref Range   Sodium 138 135 - 145 mmol/L   Potassium 4.2 3.5 - 5.1 mmol/L   Chloride 102 98 - 111 mmol/L   CO2 28 22 - 32 mmol/L   Glucose, Bld 99 70 - 99 mg/dL    Comment: Glucose reference range applies only to samples taken after fasting for at least 8 hours.   BUN 18 8 - 23 mg/dL   Creatinine, Ser 05/17/20 0.61 - 1.24 mg/dL   Calcium 9.0 8.9 - 6.21 mg/dL   Total Protein 7.4 6.5 - 8.1 g/dL   Albumin 3.9 3.5 - 5.0 g/dL   AST 17 15 - 41 U/L   ALT 12 0 - 44 U/L   Alkaline Phosphatase 77 38 - 126 U/L   Total Bilirubin 0.7 0.3 - 1.2 mg/dL   GFR, Estimated 30.8 >65 mL/min    Comment: (NOTE) Calculated using the CKD-EPI Creatinine Equation (2021)    Anion gap 8 5 - 15    Comment: Performed at [facility], [address], [place], [place]  CBC     Status: Abnormal   Collection Time: 05/15/20  8:59 PM  Result Value Ref Range   WBC 4.5 4.0 - 10.5 K/uL   RBC 4.23 4.22 - 5.81 MIL/uL   Hemoglobin 12.6 (L) 13.0 - 17.0 g/dL   HCT 05/17/20 95.2 - 84.1 %   MCV 94.1 80.0 - 100.0 fL   MCH 29.8 26.0 - 34.0 pg   MCHC 31.7 30.0 - 36.0 g/dL   RDW 32.4 40.1 - 02.7 %   Platelets 199 150 - 400 K/uL   nRBC 0.0  0.0 - 0.2 %    Comment: Performed at [facility], [address], [place], [place]  Urinalysis, Routine w reflex microscopic Urine, Clean Catch     Status: Abnormal   Collection Time: 05/16/20  1:43 AM  Result Value Ref Range   Color, Urine YELLOW YELLOW   APPearance CLEAR CLEAR  Specific Gravity, Urine 1.027 1.005 - 1.030   pH 5.0 5.0 - 8.0   Glucose, UA NEGATIVE NEGATIVE mg/dL   Hgb urine dipstick NEGATIVE NEGATIVE   Bilirubin Urine NEGATIVE NEGATIVE   Ketones, ur 5 (A) NEGATIVE mg/dL   Protein, ur 893 (A) NEGATIVE mg/dL   Nitrite NEGATIVE NEGATIVE   Leukocytes,Ua NEGATIVE NEGATIVE   RBC / HPF 0-5 0 - 5 RBC/hpf   WBC, UA 0-5 0 - 5 WBC/hpf   Bacteria, UA RARE (A) NONE SEEN   Squamous Epithelial / LPF 0-5 0 - 5   Mucus PRESENT     Comment: Performed at [facility], [address] [place], [place] [address]  CULTURE, URINE COMPREHENSIVE     Status: Abnormal   Collection Time: 05/28/20 11:12 AM   Specimen: Urine  Result Value Ref Range   MICRO NUMBER: [id number]    SPECIMEN QUALITY: Adequate    Source OTHER (SPECIFY)    STATUS: FINAL    RESULT: Gram positive cocci isolated (A)     Comment: 50,000-100,000 CFU/mL of Gram positive cocci isolated Gram positive bacilli isolated May represent colonizers from external and internal genitalia. No further testing (including susceptibility) will be performed.  Microalbumin / creatinine urine ratio     Status: Abnormal   Collection Time: 05/28/20 11:12 AM  Result Value Ref Range   Creatinine, Urine 268 20 - 320 mg/dL   Microalb, Ur 77.8 mg/dL    Comment: Reference Range Not established    Microalb Creat Ratio 39 (H) <30 mcg/mg creat    Comment: . The ADA defines abnormalities in albumin excretion as follows: Marland Kitchen Albuminuria Category        Result (mcg/mg creatinine) . Normal to Mildly increased   <30 Moderately increased         30-299  Severely increased           > OR = 300 . The ADA recommends that at least two of  three specimens collected within a 3-6 month period be abnormal before considering a patient to be within a diagnostic category.      PHQ2/9: Depression screen [facility] 2/9 06/25/2020 06/07/2020 05/28/2020 03/07/2020 12/02/2019  Decreased Interest 3 0 0 0 0  Down, Depressed, Hopeless 3 0 0 3 3  PHQ - 2 Score 6 0 0 3 3  Altered sleeping 1 - - 0 3  Tired, decreased energy 0 - - 0 3  Change in appetite 0 - - 0 3  Feeling bad or failure about yourself  1 - - 0 3  Trouble concentrating 0 - - 0 3  Moving slowly or fidgety/restless 3 - - 3 3  Suicidal thoughts 1 - - 0 3  PHQ-9 Score 12 - - 6 24  Difficult doing work/chores - - - Very difficult Somewhat difficult  Some recent data might be hidden    phq 9 is positive but given by loved ones   Fall Risk: Fall Risk  06/25/2020 06/07/2020 05/28/2020 03/07/2020 12/02/2019  Falls in the past year? 0 0 0 1 0  Number falls in past yr: 0 0 0 1 0  Injury with Fall? 0 0 0 0 0  Comment - - - - -  Risk for fall due to : - No Fall Risks - - -  Follow up - Falls prevention discussed - - -     Functional Status Survey: Is the patient deaf or have difficulty hearing?: Yes Does the patient have difficulty seeing, even when  wearing glasses/contacts?: Yes Does the patient have difficulty concentrating, remembering, or making decisions?: Yes Does the patient have difficulty walking or climbing stairs?: No Does the patient have difficulty dressing or bathing?: Yes Does the patient have difficulty doing errands alone such as visiting a doctor's office or shopping?: Yes    Assessment & Plan  1. Hypertension, benign  - losartan (COZAAR) 25 MG tablet; Take 0.5 tablets (12.5 mg total) by mouth daily.  Dispense: 45 tablet; Refill: 0   2. Legally blind in right eye, as defined in [place]   3. Moderate protein-calorie malnutrition (HCC)  Continue high calorie diet  4. Dementia with behavioral disturbance, unspecified dementia type [facility])  Discussed importance of  starting planning for placement and what to do, since wife that is also elderly is the primary caregiver

## 2020-06-25 ENCOUNTER — Other Ambulatory Visit: Payer: Self-pay

## 2020-06-25 ENCOUNTER — Ambulatory Visit (INDEPENDENT_AMBULATORY_CARE_PROVIDER_SITE_OTHER): Payer: Medicare HMO | Admitting: Family Medicine

## 2020-06-25 ENCOUNTER — Encounter: Payer: Self-pay | Admitting: Family Medicine

## 2020-06-25 VITALS — BP 110/62 | HR 82 | Temp 98.1°F | Resp 16 | Ht 61.0 in | Wt 102.0 lb

## 2020-06-25 DIAGNOSIS — R69 Illness, unspecified: Secondary | ICD-10-CM | POA: Diagnosis not present

## 2020-06-25 DIAGNOSIS — I10 Essential (primary) hypertension: Secondary | ICD-10-CM

## 2020-06-25 DIAGNOSIS — F0391 Unspecified dementia with behavioral disturbance: Secondary | ICD-10-CM

## 2020-06-25 DIAGNOSIS — H548 Legal blindness, as defined in USA: Secondary | ICD-10-CM | POA: Diagnosis not present

## 2020-06-25 DIAGNOSIS — E440 Moderate protein-calorie malnutrition: Secondary | ICD-10-CM

## 2020-06-25 MED ORDER — LOSARTAN POTASSIUM 25 MG PO TABS: 12.5000 mg | ORAL_TABLET | Freq: Every day | ORAL | 0 refills | Status: DC

## 2020-06-27 ENCOUNTER — Ambulatory Visit (INDEPENDENT_AMBULATORY_CARE_PROVIDER_SITE_OTHER): Payer: Medicare HMO

## 2020-06-27 DIAGNOSIS — F0391 Unspecified dementia with behavioral disturbance: Secondary | ICD-10-CM | POA: Diagnosis not present

## 2020-06-27 DIAGNOSIS — I10 Essential (primary) hypertension: Secondary | ICD-10-CM

## 2020-06-27 DIAGNOSIS — F0150 Vascular dementia without behavioral disturbance: Secondary | ICD-10-CM

## 2020-06-27 DIAGNOSIS — F329 Major depressive disorder, single episode, unspecified: Secondary | ICD-10-CM | POA: Diagnosis not present

## 2020-06-27 DIAGNOSIS — R69 Illness, unspecified: Secondary | ICD-10-CM | POA: Diagnosis not present

## 2020-06-27 NOTE — Chronic Care Management (AMB) (Signed)
Chronic Care Management   Initial Visit Note  06/27/2020 Name: Sean Phelps MRN: [id number] DOB: 02/19/1941  Primary Care Provider: Steele Sizer, MD Reason for referral : Chronic Care Management   Sean Phelps is a 80 y.o. year old male who is a primary care patient of Sean Sizer, MD. The CCM team was consulted for assistance with chronic disease management and care coordination. A telephonic outreach ws conducted today with his spouse Sean Phelps.  Review of Sean Phelps' status, including review of consultants reports, relevant labs and test results was conducted today. Collaboration with appropriate care team members was performed as part of the comprehensive evaluation and provision of chronic care management services.    SDOH (Social Determinants of Health) assessments performed: Yes See Care Plan activities for detailed interventions related to SDOH  SDOH Interventions   Flowsheet Row Most Recent Value  SDOH Interventions   Food Insecurity Interventions Intervention Not Indicated  Transportation Interventions Intervention Not Indicated       Medications: Outpatient Encounter Medications as of 06/27/2020  Medication Sig  . aspirin 81 MG tablet Take 81 mg by mouth daily.  . Cyanocobalamin (VITAMIN B-12) 1000 MCG SUBL Place 1 each under the tongue daily.  Marland Kitchen losartan (COZAAR) 25 MG tablet Take 0.5 tablets (12.5 mg total) by mouth daily.   No facility-administered encounter medications on file as of 06/27/2020.     Objective:   Patient Care Plan: Hypertension (Adult)    Problem Identified: Hypertension (Hypertension)     Long-Range Goal: Hypertension Monitored   Start Date: 06/27/2020  Expected End Date: 09/25/2020  Priority: Medium  Note:    Objective:  . Last practice recorded BP readings:  BP Readings from Last 3 Encounters:  06/25/20 110/62  05/28/20 116/62  05/16/20 (!) 146/73 .   Marland Kitchen Most recent eGFR/CrCl: No results found for: EGFR  No components found for:  CRCL  Current Barriers:  . Chronic Disease Management support and educational needs r/t Hypertension  . Unable to manage medication independently . Unable to perform ADL's or IADL's independently  Case Manager Clinical Goal(s):  Marland Kitchen Over the next 120 days, Caregivers will ensure patient adheres to prescribed treatment plan as evidenced by taking all medications as prescribed, monitoring and recording blood pressure, and adhering to a cardiac prudent/heart healthy diet.    Interventions: . Collaboration with Sean Sizer, MD regarding development and update of comprehensive plan of care as evidenced by provider attestation and co-signature . Inter-disciplinary care team collaboration (see longitudinal plan of care) . Reviewed medications and importance of compliance. Per spouse, several medications were discontinued d/t Sean Phelps sleeping most of the day. She continues to prepare his daily medications and ensures that he takes losartan as prescribed. Reports that he will take his medications when prompted. Able to swallow without difficulty. Encouraged to notify his provider if his ability to tolerate oral medications changes. . Provided information regarding established blood pressure parameters along with indications for notifying a provider. Encouraged to monitor his BP routinely if unable to monitor daily and record readings. . Discussed compliance with recommended cardiac prudent diet. Spouse reports that he has a very good appetite and tolerates meals without difficulty. Reports family prepares and assist with meals. Encouraged to read nutrition labels and avoid highly processed foods when possible. . Discussed complications r/t elevated BP. Reviewed s/sx of heart attack, stroke and worsening symptoms that require immediate medical attention.    Patient Goals/Self-Care Activities: -Caregivers will ensure patient takes medications as prescribed -Caregivers  will ensure patient attends all  scheduled provider appointments -Caregivers will monitor BP routinely and record readings -Caregivers will ensure patient adheres to the recommended cardiac prudent/heart healthy diet -Notify provider or care management team with questions and new concerns as needed   Follow Up Plan:  Will follow up next month      Sean Phelps spouse Sean Phelps was given information about Chronic Care Management services including:  1. CCM service includes personalized support from designated clinical staff supervised by his physician, including individualized plan of care and coordination with other care providers 2. 24/7 contact phone numbers for assistance for urgent and routine care needs. 3. Service will only be billed when office clinical staff spend 20 minutes or more in a month to coordinate care. 4. Only one practitioner may furnish and bill the service in a calendar month. 5. The patient may stop CCM services at any time (effective at the end of the month) by phone call to the office staff. 6. The patient will be responsible for cost sharing (co-pay) of up to 20% of the service fee (after annual deductible is met).  Patient's spouse Sean Phelps agreed to services. Verbal consent obtained.     PLAN A member of the care management team will follow up next month.    Sean Phelps [facility] [phone]

## 2020-06-29 ENCOUNTER — Ambulatory Visit: Payer: Medicare HMO | Admitting: *Deleted

## 2020-06-29 DIAGNOSIS — F0391 Unspecified dementia with behavioral disturbance: Secondary | ICD-10-CM

## 2020-06-29 DIAGNOSIS — F0153 Vascular dementia, unspecified severity, with mood disturbance: Secondary | ICD-10-CM

## 2020-06-29 DIAGNOSIS — F0150 Vascular dementia without behavioral disturbance: Secondary | ICD-10-CM

## 2020-06-29 NOTE — Patient Instructions (Signed)
Visit Information  PATIENT GOALS:  Goals Addressed              This Visit's Progress   .  Care for the Caregiver-Dementia        Timeframe:  Long-Range Goal Priority:  High Start Date:    06/29/20                         Expected End Date:  12/30/20                 Follow Up Date 06/29/2020    - do one enjoyable thing every day - eat healthy - exercise at least 2 to 3 times per week - get at least 7 to 8 hours of sleep at night - develop a new routine to improve sleep    Why is this important?    People who care for a person with dementia often feel stress.   Too much stress can be harmful to both of you.   You may feel depressed, exhausted, have trouble sleeping or have health problems.   You need to take care of yourself and reach out for help when you need it.   There are some things that can be done to keep the quality of your life satisfying.     Notes:     .  per patient's spouse "I would like him to remain active as much as possible" (pt-stated)        Long-term goal Start Date: 06/29/20 Follow Up Date 07/13/20 Expected End Date:12/30/20   - check out options for in-home help, long-term care or hospice - discuss my treatment options with the doctor or nurse  -continue to practice self care-(caregiver)   Why is this important?   Having a long-term illness can be scary.  It can also be stressful for you and your caregiver.  These steps may help.    Notes: Patient's spouse continues to  encourage patient to participate in outdoor activities. Patient continues to decline participation in Day Programs and outdoor activities without his spouse being present . [facility] remain available when needed    .  per patient's spouse "I would like to consider finding help in the community even though he may not go" (pt-stated)        Start Date:   Follow Up Date 12/0122021   - Followed up on recommendation for patient to consider the Adult Day Program which  will be available next month    Patient's wife willing to consider this, however patient refuses Adult Day Program at this time    Collaboration phone call to [place], to discuss possible custodial care hours. It was confirmed that patient does not have this benefit. It was discussed with patient's spouse, that she continue to allow assistance with patient's care by family to avoid caregiver strain.          Sean Phelps was given information about Care Management services by the embedded care coordination team including:  1. Care Management services include personalized support from designated clinical staff supervised by his physician, including individualized plan of care and coordination with other care providers 2. 24/7 contact phone numbers for assistance for urgent and routine care needs. 3. The patient may stop CCM services at any time (effective at the end of the month) by phone call to the office staff.  Patient agreed to services and verbal consent obtained.   The patient verbalized understanding  of instructions, educational materials, and care plan provided today and declined offer to receive copy of patient instructions, educational materials, and care plan.   Telephone follow up appointment with care management team member scheduled for:  07/13/20   Verna Czech, LCSW Clinical Social Worker  [facility] [phone]

## 2020-06-29 NOTE — Chronic Care Management (AMB) (Signed)
Chronic Care Management    Clinical Social Work Note  06/29/2020 Name: Sean Phelps MRN: [id number] DOB: 19-Nov-1940  KAIDYN JAVID is a 80 y.o. year old male who is a primary care patient of Steele Sizer, MD. The CCM team was consulted to assist the patient with chronic disease management and/or care coordination needs related to: [employer] .   Engaged with patient by telephone for initial visit in response to provider referral for social work chronic care management and care coordination services.   Consent to Services:  The patient was given the following information about Chronic Care Management services today, agreed to services, and gave verbal consent: 1. CCM service includes personalized support from designated clinical staff supervised by the primary care provider, including individualized plan of care and coordination with other care providers 2. 24/7 contact phone numbers for assistance for urgent and routine care needs. 3. Service will only be billed when office clinical staff spend 20 minutes or more in a month to coordinate care. 4. Only one practitioner may furnish and bill the service in a calendar month. 5.The patient may stop CCM services at any time (effective at the end of the month) by phone call to the office staff. 6. The patient will be responsible for cost sharing (co-pay) of up to 20% of the service fee (after annual deductible is met). Patient agreed to services and consent obtained.  Patient agreed to services and consent obtained.   Assessment: Review of patient past medical history, allergies, medications, and health status, including review of relevant consultants reports was performed today as part of a comprehensive evaluation and provision of chronic care management and care coordination services.     SDOH (Social Determinants of Health) assessments and interventions performed:    Advanced Directives Status: Not addressed in this  encounter.  CCM Care Plan  Allergies  Allergen Reactions  . Brinzolamide-Brimonidine Other (See Comments)    Red and itchy    Outpatient Encounter Medications as of 06/29/2020  Medication Sig  . aspirin 81 MG tablet Take 81 mg by mouth daily.  . Cyanocobalamin (VITAMIN B-12) 1000 MCG SUBL Place 1 each under the tongue daily.  Marland Kitchen losartan (COZAAR) 25 MG tablet Take 0.5 tablets (12.5 mg total) by mouth daily.   No facility-administered encounter medications on file as of 06/29/2020.    Patient Active Problem List   Diagnosis Date Noted  . B12 deficiency 05/07/2020  . Delusions ([place]) 05/07/2020  . Hallucination 05/07/2020  . TSH (thyroid-stimulating hormone deficiency) 05/07/2020  . Moderate dementia, with behavioral disturbance ([place]) 01/20/2017  . Loss of memory 12/03/2016  . Thoracic aortic atherosclerosis ([place]) 07/20/2016  . Corneal edema of right eye 05/19/2016  . Pseudophakia of right eye 04/09/2016  . Status post cataract extraction and insertion of intraocular lens of left eye 04/09/2016  . Primary open angle glaucoma of right eye, severe stage 08/10/2015  . Status post eye surgery 08/10/2015  . Recurrent major depression-severe ([place]) 07/13/2015  . Hypertension, benign 07/13/2015  . Hyperlipidemia 07/13/2015  . Cataract 07/13/2015  . Abnormal brain MRI 07/13/2015  . Legally blind in right eye, as defined in [place] 07/13/2015  . Dementia, vascular, with depression ([place]) 09/05/2014  . Benign enlargement of prostate 03/22/2009    Conditions to be addressed/monitored: Dementia; Cognitive Deficits  Care Plan : Dementia (Adult)  Updates made by Vern Claude, LCSW since 06/29/2020 12:00 AM    Problem: Caregiver Stress     Long-Range Goal: Caregiver Coping  Optimized   Start Date: 06/29/2020  Expected End Date: 12/31/2020  This Visit's Progress: On track  Priority: Medium  Note:   Current Barriers:  . Caregiver stress due to patients Dementia symptoms . ADL IADL  limitations . Suicidal Ideation/Homicidal Ideation: No  Clinical Social Work Goal(s):  Marland Kitchen Over the next 90 days, patient's spouse will work with SW bi-weekly by telephone or in person to reduce or manage symptoms related to caregiver stress  Interventions: . Patient's spouse interviewed and appropriate assessments performed:  .  Patient's spouse discussed increased stress due to patient's activity during the day-pacing frequently, placing objects in the toilet and just being unsettled overall . Patient's spouse confirmed call in to patient's Neurologist to discuss possible medications . Confirmed continued plan to have patient remain in the home, with caregiver support by spouse and daughters-patient never left unsupervised. . Discussed safety plan of placing alarms on doors  . Confirmed that patient continues to receive Meals on Wheels . Caregiver stress acknowledged-emphasized the need for self care . Active listening / Reflection utilized  . Emotional Supportive Provided, verbalization of feeling encouraged  Patient Self Care Activities:  . Strong family or social support  Patient Coping Strengths:  . Family . Community Organizations  Patient Self Care Deficits:  . Unable to perform ADLs independently . Unable to perform IADLs independently     Task: Recognize and Manage Caregiver Stress        Follow Up Plan: SW will follow up with patient by phone over the next 14 business days       [place], [place] Worker  [facility] [phone]

## 2020-07-09 NOTE — Patient Instructions (Signed)
Thank You for allowing the Chronic Care Management team to participate in your care. It was a pleasure speaking with you today. Please feel free to contact me with questions.   Goals Addressed: Patient Care Plan: Hypertension (Adult)    Problem Identified: Hypertension (Hypertension)     Long-Range Goal: Hypertension Monitored   Start Date: 06/27/2020  Expected End Date: 09/25/2020  Priority: Medium  Note:    Objective:  . Last practice recorded BP readings:  BP Readings from Last 3 Encounters:  06/25/20 110/62  05/28/20 116/62  05/16/20 (!) 146/73 .   Marland Kitchen Most recent eGFR/CrCl: No results found for: EGFR  No components found for: CRCL  Current Barriers:  . Chronic Disease Management support and educational needs r/t Hypertension  . Unable to manage medication independently . Unable to perform ADL's or IADL's independently  Case Manager Clinical Goal(s):  Marland Kitchen Over the next 120 days, Caregivers will ensure patient adheres to prescribed treatment plan as evidenced by taking all medications as prescribed, monitoring and recording blood pressure, and adhering to a cardiac prudent/heart healthy diet.    Interventions: . Collaboration with Steele Sizer, MD regarding development and update of comprehensive plan of care as evidenced by provider attestation and co-signature . Inter-disciplinary care team collaboration (see longitudinal plan of care) . Reviewed medications and importance of compliance. Per spouse, several medications were discontinued d/t Mr. Henricksen sleeping most of the day. She continues to prepare his daily medications and ensures that he takes losartan as prescribed. Reports that he will take his medications when prompted. Able to swallow without difficulty. Encouraged to notify his provider if his ability to tolerate oral medications changes. . Provided information regarding established blood pressure parameters along with indications for notifying a provider. Encouraged to  monitor his BP routinely if unable to monitor daily and record readings. . Discussed compliance with recommended cardiac prudent diet. Spouse reports that he has a very good appetite and tolerates meals without difficulty. Reports family prepares and assist with meals. Encouraged to read nutrition labels and avoid highly processed foods when possible. . Discussed complications r/t elevated BP. Reviewed s/sx of heart attack, stroke and worsening symptoms that require immediate medical attention.    Patient Goals/Self-Care Activities: -Caregivers will ensure patient takes medications as prescribed -Caregivers will ensure patient attends all scheduled provider appointments -Caregivers will monitor BP routinely and record readings -Caregivers will ensure patient adheres to the recommended cardiac prudent/heart healthy diet -Notify provider or care management team with questions and new concerns as needed   Follow Up Plan:  Will follow up next month      Mr. Vaccarella spouse [name] was given information about Chronic Care Management services including:  1. CCM service includes personalized support from designated clinical staff supervised by his physician, including individualized plan of care and coordination with other care providers 2. 24/7 contact phone numbers for assistance for urgent and routine care needs. 3. Service will only be billed when office clinical staff spend 20 minutes or more in a month to coordinate care. 4. Only one practitioner may furnish and bill the service in a calendar month. 5. The patient may stop CCM services at any time (effective at the end of the month) by phone call to the office staff. 6. The patient will be responsible for cost sharing (co-pay) of up to 20% of the service fee (after annual deductible is met).  Patient's spouse [name] agreed to services. Verbal consent obtained.      Mr. Sondgeroth' spouse  [name] verbalized understanding of the information  discussed during the telephonic outreach today. Declined need for mailed/printed instructions. A member of the care management team will follow up next month.    [facility] [phone]

## 2020-07-13 ENCOUNTER — Ambulatory Visit: Payer: Self-pay | Admitting: *Deleted

## 2020-07-13 DIAGNOSIS — F0150 Vascular dementia without behavioral disturbance: Secondary | ICD-10-CM

## 2020-07-13 DIAGNOSIS — F0391 Unspecified dementia with behavioral disturbance: Secondary | ICD-10-CM

## 2020-07-13 DIAGNOSIS — I10 Essential (primary) hypertension: Secondary | ICD-10-CM

## 2020-07-13 DIAGNOSIS — F0153 Vascular dementia, unspecified severity, with mood disturbance: Secondary | ICD-10-CM

## 2020-07-13 DIAGNOSIS — F329 Major depressive disorder, single episode, unspecified: Secondary | ICD-10-CM

## 2020-07-13 DIAGNOSIS — R69 Illness, unspecified: Secondary | ICD-10-CM | POA: Diagnosis not present

## 2020-07-13 NOTE — Chronic Care Management (AMB) (Signed)
Chronic Care Management    Clinical Social Work Note  07/13/2020 Name: Sean Phelps MRN: [id number] DOB: 12-13-1940  Sean Phelps is a 80 y.o. year old male who is a primary care patient of Alba Cory, MD. The CCM team was consulted to assist the patient with chronic disease management and/or care coordination needs related to: [employer] .   Engaged with patient's spouse by telephone for follow up visit in response to provider referral for social work chronic care management and care coordination services.   Consent to Services:  The patient was given information about Chronic Care Management services, agreed to services, and gave verbal consent prior to initiation of services.  Please see initial visit note for detailed documentation.   Patient agreed to services and consent obtained.   Assessment: Review of patient past medical history, allergies, medications, and health status, including review of relevant consultants reports was performed today as part of a comprehensive evaluation and provision of chronic care management and care coordination services.     SDOH (Social Determinants of Health) assessments and interventions performed:    Advanced Directives Status: Not addressed in this encounter.  CCM Care Plan  Allergies  Allergen Reactions  . Brinzolamide-Brimonidine Other (See Comments)    Red and itchy    Outpatient Encounter Medications as of 07/13/2020  Medication Sig  . aspirin 81 MG tablet Take 81 mg by mouth daily.  . Cyanocobalamin (VITAMIN B-12) 1000 MCG SUBL Place 1 each under the tongue daily.  Marland Kitchen losartan (COZAAR) 25 MG tablet Take 0.5 tablets (12.5 mg total) by mouth daily.   No facility-administered encounter medications on file as of 07/13/2020.    Patient Active Problem List   Diagnosis Date Noted  . B12 deficiency 05/07/2020  . Delusions (H[facility]) 05/07/2020  . Hallucination 05/07/2020  . TSH (thyroid-stimulating hormone deficiency)  05/07/2020  . Moderate dementia, with behavioral disturbance ([facility]) 01/20/2017  . Loss of memory 12/03/2016  . Thoracic aortic atherosclerosis ([facility]) 07/20/2016  . Corneal edema of right eye 05/19/2016  . Pseudophakia of right eye 04/09/2016  . Status post cataract extraction and insertion of intraocular lens of left eye 04/09/2016  . Primary open angle glaucoma of right eye, severe stage 08/10/2015  . Status post eye surgery 08/10/2015  . Recurrent major depression-severe (H[facility]) 07/13/2015  . Hypertension, benign 07/13/2015  . Hyperlipidemia 07/13/2015  . Cataract 07/13/2015  . Abnormal brain MRI 07/13/2015  . Legally blind in right eye, as defined in [place] 07/13/2015  . Dementia, vascular, with depression (HCC) 09/05/2014  . Benign enlargement of prostate 03/22/2009    Conditions to be addressed/monitored: Dementia; Memory Deficits  Care Plan : Dementia (Adult)  Updates made by Wenda Overland, LCSW since 07/13/2020 12:00 AM    Problem: Caregiver Stress     Long-Range Goal: Caregiver Coping Optimized   Start Date: 06/29/2020  Expected End Date: 12/31/2020  This Visit's Progress: On track  Recent Progress: On track  Priority: Medium  Note:   Current Barriers:  . Caregiver stress due to patients Dementia symptoms . ADL IADL limitations . Suicidal Ideation/Homicidal Ideation: No  Clinical Social Work Goal(s):  Marland Kitchen Over the next 90 days, patient's spouse will work with SW bi-weekly by telephone or in person to reduce or manage symptoms related to caregiver stress  Interventions: . Patient's spouse interviewed and appropriate assessments performed:  .  Patient's spouse continues to discussed increased stress due to patient's activity during the day-constant pacing-busy  . Patient's spouse  confirmed call in to patient's Neurologist to discuss possible medications-appointment scheduled for 07/20/20 . Confirmed continued plan to have patient remain in the home, with caregiver support by  spouse and daughters-patient never left unsupervised. .  safety plan continued to be emphasized-alternatives to keeping patient occupied discussed . Confirmed that patient continues to receive Meals on Wheels . Caregiver stress acknowledged-emphasized the need for self care . Active listening / Reflection utilized  . Emotional Supportive Provided, verbalization of feeling encouraged  Patient Self Care Activities:  . Strong family or social support  Patient Coping Strengths:  . Family . Community Organizations  Patient Self Care Deficits:  . Unable to perform ADLs independently . Unable to perform IADLs independently        Follow Up Plan: SW will follow up with patient by phone over the next 14 business days      [place], [place] Clinical Social Worker  [facility] [phone]

## 2020-07-13 NOTE — Patient Instructions (Signed)
Visit Information  Goals Addressed              This Visit's Progress   .  per patient's spouse "I would like him to remain active as much as possible" (pt-stated)        Long-term goal Start Date: 06/29/20 Follow Up Date 07/13/20 Expected End Date:12/30/20   - continue to check out options for in-home help - discuss treatment options with the doctor or nurse during next appointment on 07/20/20 -continue to practice self care-(caregiver)   Why is this important?   Having a long-term illness can be scary.  It can also be stressful for you and your caregiver.  These steps may help.    Notes: Patient's spouse continues to  encourage patient to participate in outdoor activities. Patient continues to decline participation in Day Programs and outdoor activities without his spouse being present . Community Resources remain available when needed       The patient verbalized understanding of instructions, educational materials, and care plan provided today and declined offer to receive copy of patient instructions, educational materials, and care plan.   Telephone follow up appointment with care management team member scheduled for: 07/27/20  Verna Czech, LCSW Clinical Social Worker  [facility] [phone]

## 2020-07-20 DIAGNOSIS — R443 Hallucinations, unspecified: Secondary | ICD-10-CM | POA: Diagnosis not present

## 2020-07-20 DIAGNOSIS — E538 Deficiency of other specified B group vitamins: Secondary | ICD-10-CM | POA: Diagnosis not present

## 2020-07-20 DIAGNOSIS — R413 Other amnesia: Secondary | ICD-10-CM | POA: Diagnosis not present

## 2020-07-20 DIAGNOSIS — G479 Sleep disorder, unspecified: Secondary | ICD-10-CM | POA: Diagnosis not present

## 2020-07-27 ENCOUNTER — Ambulatory Visit (INDEPENDENT_AMBULATORY_CARE_PROVIDER_SITE_OTHER): Payer: Medicare HMO | Admitting: *Deleted

## 2020-07-27 DIAGNOSIS — F0153 Vascular dementia, unspecified severity, with mood disturbance: Secondary | ICD-10-CM

## 2020-07-27 DIAGNOSIS — F0150 Vascular dementia without behavioral disturbance: Secondary | ICD-10-CM

## 2020-07-27 DIAGNOSIS — F0391 Unspecified dementia with behavioral disturbance: Secondary | ICD-10-CM

## 2020-07-30 NOTE — Patient Instructions (Signed)
Visit Information  Goals Addressed            This Visit's Progress   . Care for the Caregiver-Dementia       Timeframe:  Long-Range Goal Priority:  High Start Date:    06/29/20                         Expected End Date:  12/30/20                 Follow Up Date 08/10/2020    - do one enjoyable thing every day - eat healthy - exercise at least 2 to 3 times per week - get at least 7 to 8 hours of sleep at night - develop a new routine to improve sleep  -continue to take medications prescribed by Neurologist   Why is this important?    People who care for a person with dementia often feel stress.   Too much stress can be harmful to both of you.   You may feel depressed, exhausted, have trouble sleeping or have health problems.   You need to take care of yourself and reach out for help when you need it.   There are some things that can be done to keep the quality of your life satisfying.     Notes:        The patient verbalized understanding of instructions, educational materials, and care plan provided today and declined offer to receive copy of patient instructions, educational materials, and care plan.   Telephone follow up appointment with care management team member scheduled for: 08/10/20   Verna Czech, LCSW Clinical Social Worker  [facility] [phone]

## 2020-07-30 NOTE — Chronic Care Management (AMB) (Signed)
  Chronic Care Management    Clinical Social Work Note  07/30/2020 Name: Sean Phelps MRN: [id number] DOB: [birth year]  Sean Phelps is a 80 y.o. year old male who is a primary care patient of Alba Cory, MD. The CCM team was consulted to assist the patient with chronic disease management and/or care coordination needs related to: [employer] .   Engaged with patient's spouse by telephone for follow up visit in response to provider referral for social work chronic care management and care coordination services.   Consent to Services:  The patient was given information about Chronic Care Management services, agreed to services, and gave verbal consent prior to initiation of services.  Please see initial visit note for detailed documentation.   Patient agreed to services and consent obtained.   Assessment: Review of patient past medical history, allergies, medications, and health status, including review of relevant consultants reports was performed today as part of a comprehensive evaluation and provision of chronic care management and care coordination services.     SDOH (Social Determinants of Health) assessments and interventions performed:    Advanced Directives Status: Not addressed in this encounter.  CCM Care Plan  Allergies  Allergen Reactions  . Brinzolamide-Brimonidine Other (See Comments)    Red and itchy    Outpatient Encounter Medications as of 07/27/2020  Medication Sig  . aspirin 81 MG tablet Take 81 mg by mouth daily.  . Cyanocobalamin (VITAMIN B-12) 1000 MCG SUBL Place 1 each under the tongue daily.  Marland Kitchen losartan (COZAAR) 25 MG tablet Take 0.5 tablets (12.5 mg total) by mouth daily.   No facility-administered encounter medications on file as of 07/27/2020.    Patient Active Problem List   Diagnosis Date Noted  . B12 deficiency 05/07/2020  . Delusions (HCC) 05/07/2020  . Hallucination 05/07/2020  . TSH (thyroid-stimulating hormone deficiency)  05/07/2020  . Moderate dementia, with behavioral disturbance (HCC) 01/20/2017  . Loss of memory 12/03/2016  . Thoracic aortic atherosclerosis (HCC) 07/20/2016  . Corneal edema of right eye 05/19/2016  . Pseudophakia of right eye 04/09/2016  . Status post cataract extraction and insertion of intraocular lens of left eye 04/09/2016  . Primary open angle glaucoma of right eye, severe stage 08/10/2015  . Status post eye surgery 08/10/2015  . Recurrent major depression-severe (HCC) 07/13/2015  . Hypertension, benign 07/13/2015  . Hyperlipidemia 07/13/2015  . Cataract 07/13/2015  . Abnormal brain MRI 07/13/2015  . Legally blind in right eye, as defined in [place] 07/13/2015  . Dementia, vascular, with depression (HCC) 09/05/2014  . Benign enlargement of prostate 03/22/2009    Conditions to be addressed/monitored: Dementia; Memory Deficits  There are no care plans that you recently modified to display for this patient.    Follow Up Plan: SW will follow up with patient by phone over the next 7-14 business days      Anan D[place], [place] Clinical Social Worker  [facility] [phone]

## 2020-08-01 ENCOUNTER — Ambulatory Visit: Payer: Self-pay

## 2020-08-01 DIAGNOSIS — F0391 Unspecified dementia with behavioral disturbance: Secondary | ICD-10-CM | POA: Diagnosis not present

## 2020-08-01 DIAGNOSIS — F329 Major depressive disorder, single episode, unspecified: Secondary | ICD-10-CM | POA: Diagnosis not present

## 2020-08-01 DIAGNOSIS — Z9181 History of falling: Secondary | ICD-10-CM

## 2020-08-01 DIAGNOSIS — I10 Essential (primary) hypertension: Secondary | ICD-10-CM | POA: Diagnosis not present

## 2020-08-01 DIAGNOSIS — R69 Illness, unspecified: Secondary | ICD-10-CM | POA: Diagnosis not present

## 2020-08-01 DIAGNOSIS — F0150 Vascular dementia without behavioral disturbance: Secondary | ICD-10-CM | POA: Diagnosis not present

## 2020-08-01 NOTE — Chronic Care Management (AMB) (Signed)
Chronic Care Management   Follow Up Note   08/01/2020 Name: EDOARDO LAFORTE MRN: [id number] DOB: 1941-01-12  Primary Care Provider: Steele Sizer, MD Reason for referral : Chronic Care Management   RIELEY HAUSMAN is a 80 y.o. year old male who is a primary care patient of Steele Sizer, MD. He is currently enrolled in the Chronic Care Management program. A telephonic outreach was conducted today with his spouse/caregiver [name].  Review of Mr. Davis' status, including review of consultants reports, relevant labs and test results was conducted today. Collaboration with appropriate care team members was performed as part of the comprehensive evaluation and provision of chronic care management services.    SDOH (Social Determinants of Health) assessments performed: No    Outpatient Encounter Medications as of 08/01/2020  Medication Sig   aspirin 81 MG tablet Take 81 mg by mouth daily.   Cyanocobalamin (VITAMIN B-12) 1000 MCG SUBL Place 1 each under the tongue daily.   losartan (COZAAR) 25 MG tablet Take 0.5 tablets (12.5 mg total) by mouth daily.   No facility-administered encounter medications on file as of 08/01/2020.       Objective:  Patient Care Plan: Hypertension (Adult)   Problem Identified: Hypertension (Hypertension)    Long-Range Goal: Hypertension Monitored   Start Date: 06/27/2020  Expected End Date: 09/25/2020  Priority: Medium   Objective:  Last practice recorded BP readings:  BP Readings from Last 3 Encounters:  06/25/20 110/62  05/28/20 116/62  05/16/20 (!) 146/73   Most recent eGFR/CrCl: No results found for: EGFR  No components found for: CRCL   Current Barriers:  Chronic Disease Management support and educational needs r/t Hypertension  Unable to manage medication independently Unable to perform ADL's or IADL's independently  Case Manager Clinical Goal(s):  Over the next 120 days, Caregivers will ensure patient adheres to prescribed treatment  plan as evidenced by taking all medications as prescribed, monitoring and recording blood pressure, and adhering to a cardiac prudent/heart healthy diet.    Interventions: Collaboration with Steele Sizer, MD regarding development and update of comprehensive plan of care as evidenced by provider attestation and co-signature Inter-disciplinary care team collaboration (see longitudinal plan of care) Reviewed medications. Spouse continues to prepare daily medications. Reports he is taking them as prescribed and tolerating the regimen. No changes in ability to swallow pills.  Reviewed established blood pressure parameters along with indications for notifying a provider. Spouse reports that his BP readings have been within range. Encouraged to continue monitoring and record readings. Discussed nutritional intake. Per spouse, he has a very good appetite. She continues to prepare meals. He is also receiving meal delivery. Reports that he is doing well with intake of heart healthy foods. Declined need for additional nutritional resources. Reviewed s/sx of heart attack, stroke and worsening symptoms that require immediate medical attention.    Patient Goals/Self-Care Activities: -Caregivers will ensure patient takes medications as prescribed -Caregivers will ensure patient attends all scheduled provider appointments -Caregivers will monitor BP routinely and record readings -Caregivers will ensure patient adheres to the recommended cardiac prudent/heart healthy diet -Notify provider or care management team with questions and new concerns as needed   Follow Up Plan:  Will follow up next month     Patient Care Plan: Fall Risk (Adult)   Problem Identified: Fall Risk    Long-Range Goal: Absence of Fall and Fall-Related Injury   Start Date: 08/01/2020  Expected End Date: 10/30/2020  Priority: High  Fall Risk  08/01/2020 06/27/2020 06/25/2020  06/07/2020 05/28/2020  Falls in the past year? 1 0 0 0 0  Number  falls in past yr: 1 0 0 0 0  Injury with Fall? 0 - 0 0 0  Comment - - - - -  Risk for fall due to : Impaired mobility;History of fall(s) Impaired vision - No Fall Risks -  Risk for fall due to: Comment Spouse reports two falls within the last month - - - -  Follow up Falls prevention discussed Falls prevention discussed - Falls prevention discussed -    Current Barriers:  Risk For Falls  Unable to perform ADLs independently Unable to perform IADLs independently   Clinical Goal(s):  Over the next 90 days, patient will not experience falls or require hospitalization/emergent care due to fall related injuries.   Interventions:  Collaboration with Steele Sizer, MD regarding development and update of comprehensive plan of care as evidenced by provider attestation and co-signature Inter-disciplinary care team collaboration (see longitudinal plan of care) Discussed falls since last outreach. Spouse reports he experienced a recent fall d/t walking too quickly. Reports he stumbled and fell on his knees. Denies bruises or open wounds. Spouse reports that he was able to get up and ambulate without difficulty. Denies injuries or need for medical evaluation. Provided information regarding safety and fall prevention measures. Spouse denies changes in Mr. Vanmeter' gait or balance since the fall. Reports that he has ambulated well without an assistive device since the incident. Confirmed having a cane and walker available to use if needed. Reports family members are readily available to assist in the home as needed.    Patient Goals/Self-Care Activities -Ensure pathways are clear and well lit -Use appropriate assistive device when needed -Ensure patient is wearing non-skid footwear to prevent accidental falls and injuries -Continue to provide assistance with ADL'S and IADL'S as needed.   Follow Up Plan:  Will follow up next month        PLAN A member of the care management team will follow  up next month.     [facility] [phone]

## 2020-08-07 NOTE — Patient Instructions (Signed)
Thank you for allowing the Chronic Care Management team to participate in your care.  It was a pleasure speaking with you. Please feel free to contact me with questions.   Goals Addressed: Patient Care Plan: Hypertension (Adult)   Problem Identified: Hypertension (Hypertension)    Long-Range Goal: Hypertension Monitored   Start Date: 06/27/2020  Expected End Date: 09/25/2020  Priority: Medium   Objective:  Last practice recorded BP readings:  BP Readings from Last 3 Encounters:  06/25/20 110/62  05/28/20 116/62  05/16/20 (!) 146/73   Most recent eGFR/CrCl: No results found for: EGFR  No components found for: CRCL   Current Barriers:  Chronic Disease Management support and educational needs r/t Hypertension  Unable to manage medication independently Unable to perform ADL's or IADL's independently  Case Manager Clinical Goal(s):  Over the next 120 days, Caregivers will ensure patient adheres to prescribed treatment plan as evidenced by taking all medications as prescribed, monitoring and recording blood pressure, and adhering to a cardiac prudent/heart healthy diet.    Interventions: Collaboration with Steele Sizer, MD regarding development and update of comprehensive plan of care as evidenced by provider attestation and co-signature Inter-disciplinary care team collaboration (see longitudinal plan of care) Reviewed medications. Spouse continues to prepare daily medications. Reports he is taking them as prescribed and tolerating the regimen. No changes in ability to swallow pills.  Reviewed established blood pressure parameters along with indications for notifying a provider. Spouse reports that his BP readings have been within range. Encouraged to continue monitoring and record readings. Discussed nutritional intake. Per spouse, he has a very good appetite. She continues to prepare meals. He is also receiving meal delivery. Reports that he is doing well with intake of heart  healthy foods. Declined need for additional nutritional resources. Reviewed s/sx of heart attack, stroke and worsening symptoms that require immediate medical attention.    Patient Goals/Self-Care Activities: -Caregivers will ensure patient takes medications as prescribed -Caregivers will ensure patient attends all scheduled provider appointments -Caregivers will monitor BP routinely and record readings -Caregivers will ensure patient adheres to the recommended cardiac prudent/heart healthy diet -Notify provider or care management team with questions and new concerns as needed   Follow Up Plan:  Will follow up next month     Patient Care Plan: Fall Risk (Adult)   Problem Identified: Fall Risk    Long-Range Goal: Absence of Fall and Fall-Related Injury   Start Date: 08/01/2020  Expected End Date: 10/30/2020  Priority: High  Fall Risk  08/01/2020 06/27/2020 06/25/2020 06/07/2020 05/28/2020  Falls in the past year? 1 0 0 0 0  Number falls in past yr: 1 0 0 0 0  Injury with Fall? 0 - 0 0 0  Comment - - - - -  Risk for fall due to : Impaired mobility;History of fall(s) Impaired vision - No Fall Risks -  Risk for fall due to: Comment Spouse reports two falls within the last month - - - -  Follow up Falls prevention discussed Falls prevention discussed - Falls prevention discussed -    Current Barriers:  Risk For Falls  Unable to perform ADLs independently Unable to perform IADLs independently   Clinical Goal(s):  Over the next 90 days, patient will not experience falls or require hospitalization/emergent care due to fall related injuries.   Interventions:  Collaboration with Steele Sizer, MD regarding development and update of comprehensive plan of care as evidenced by provider attestation and co-signature Inter-disciplinary care team collaboration (see longitudinal  plan of care) Discussed falls since last outreach. Spouse reports he experienced a recent fall d/t walking too quickly.  Reports he stumbled and fell on his knees. Denies bruises or open wounds. Spouse reports that he was able to get up and ambulate without difficulty. Denies injuries or need for medical evaluation. Provided information regarding safety and fall prevention measures. Spouse denies changes in Mr. Winokur' gait or balance since the fall. Reports that he has ambulated well without an assistive device since the incident. Confirmed having a cane and walker available to use if needed. Reports family members are readily available to assist in the home as needed.    Patient Goals/Self-Care Activities -Ensure pathways are clear and well lit -Use appropriate assistive device when needed -Ensure patient is wearing non-skid footwear to prevent accidental falls and injuries -Continue to provide assistance with ADL'S and IADL'S as needed.   Follow Up Plan:  Will follow up next month         Mr. Spink's spouse verbalized understanding of the information discussed during the telephonic outreach today. Declined need for mailed/printed instructions. A member of the care management team will follow up next month.     [facility] Care [facility] [phone]

## 2020-08-10 ENCOUNTER — Ambulatory Visit: Payer: Self-pay | Admitting: *Deleted

## 2020-08-10 DIAGNOSIS — F0153 Vascular dementia, unspecified severity, with mood disturbance: Secondary | ICD-10-CM

## 2020-08-10 DIAGNOSIS — F0391 Unspecified dementia with behavioral disturbance: Secondary | ICD-10-CM

## 2020-08-10 NOTE — Patient Instructions (Signed)
Visit Information   Goals Addressed             This Visit's Progress    Care for the Caregiver-Dementia       Timeframe:  Long-Range Goal Priority:  High Start Date:    06/29/20                         Expected End Date:  12/30/20                 Follow Up Date 08/24/20   - do one enjoyable thing every day - eat healthy - exercise at least 2 to 3 times per week - get at least 7 to 8 hours of sleep at night - develop a new routine to improve sleep  -continue to take medications prescribed by Neurologist -caregiver-continue self management strategies ie accepted assistance for care from others   Why is this important?   People who care for a person with dementia often feel stress.  Too much stress can be harmful to both of you.  You may feel depressed, exhausted, have trouble sleeping or have health problems.  You need to take care of yourself and reach out for help when you need it.  There are some things that can be done to keep the quality of your life satisfying.     Notes:          The patient verbalized understanding of instructions, educational materials, and care plan provided today and declined offer to receive copy of patient instructions, educational materials, and care plan.   Telephone follow up appointment with care management team member scheduled for: 08/24/20  Verna Czech, LCSW Clinical Social Worker  [facility] Care Management [phone]

## 2020-08-10 NOTE — Chronic Care Management (AMB) (Signed)
Chronic Care Management    Clinical Social Work Note  08/10/2020 Name: Sean Phelps MRN: [id number] DOB: 03/03/1940  Sean Phelps is a 80 y.o. year old male who is a primary care patient of Alba Cory, MD. The CCM team was consulted to assist the patient with chronic disease management and/or care coordination needs related to: [employer] .   Collaboration with paitent' spouse  for follow up visit in response to provider referral for social work chronic care management and care coordination services.   Consent to Services:  The patient was given information about Chronic Care Management services, agreed to services, and gave verbal consent prior to initiation of services.  Please see initial visit note for detailed documentation.   Patient agreed to services and consent obtained.   Assessment: Review of patient past medical history, allergies, medications, and health status, including review of relevant consultants reports was performed today as part of a comprehensive evaluation and provision of chronic care management and care coordination services.     SDOH (Social Determinants of Health) assessments and interventions performed:    Advanced Directives Status: Not addressed in this encounter.  CCM Care Plan  Allergies  Allergen Reactions   Brinzolamide-Brimonidine Other (See Comments)    Red and itchy    Outpatient Encounter Medications as of 08/10/2020  Medication Sig   aspirin 81 MG tablet Take 81 mg by mouth daily.   Cyanocobalamin (VITAMIN B-12) 1000 MCG SUBL Place 1 each under the tongue daily.   losartan (COZAAR) 25 MG tablet Take 0.5 tablets (12.5 mg total) by mouth daily.   No facility-administered encounter medications on file as of 08/10/2020.    Patient Active Problem List   Diagnosis Date Noted   B12 deficiency 05/07/2020   Delusions (HCC) 05/07/2020   Hallucination 05/07/2020   TSH (thyroid-stimulating hormone deficiency) 05/07/2020    Moderate dementia, with behavioral disturbance (HCC) 01/20/2017   Loss of memory 12/03/2016   Thoracic aortic atherosclerosis (HCC) 07/20/2016   Corneal edema of right eye 05/19/2016   Pseudophakia of right eye 04/09/2016   Status post cataract extraction and insertion of intraocular lens of left eye 04/09/2016   Primary open angle glaucoma of right eye, severe stage 08/10/2015   Status post eye surgery 08/10/2015   Recurrent major depression-severe (HCC) 07/13/2015   Hypertension, benign 07/13/2015   Hyperlipidemia 07/13/2015   Cataract 07/13/2015   Abnormal brain MRI 07/13/2015   Legally blind in right eye, as defined in [place] 07/13/2015   Dementia, vascular, with depression (HCC) 09/05/2014   Benign enlargement of prostate 03/22/2009    Conditions to be addressed/monitored: Dementia; Memory Deficits  Care Plan : Dementia (Adult)  Updates made by Wenda Overland, LCSW since 08/10/2020 12:00 AM     Problem: Caregiver Stress      Long-Range Goal: Caregiver Coping Optimized   Start Date: 06/29/2020  Expected End Date: 12/31/2020  This Visit's Progress: On track  Recent Progress: On track  Priority: Medium  Note:   Current Barriers:  Caregiver stress due to patients Dementia symptoms ADL IADL limitations Suicidal Ideation/Homicidal Ideation: No  Clinical Social Work Goal(s):  Over the next 90 days, patient's spouse will work with SW bi-weekly by telephone or in person to reduce or manage symptoms related to caregiver stress  Interventions: Patient's spouse interviewed and appropriate assessments performed:   Patient's spouse continues to discussed increased stress due to patient's activity during the day-slight improvement in ability to sleep through the night Patient's spouse  confirmed plan to discuss continued concerns with patient's Neurologist Confirmed continued plan to have patient remain in the home, with caregiver support by spouse and daughters-patient never left  unsupervised.  safety plan continued to be emphasized-patient's spouse now has a family friend who will be helping with patient's care when spouse and daughter are not available Caregiver stress acknowledged-emphasized the need for self care-spouse continues to verbalize positive self management strategies Active listening / Reflection utilized  Emotional Supportive Provided, verbalization of feeling encouraged  Patient Self Care Activities:  Strong family or social support  Patient Coping Strengths:  Risk manager  Patient Self Care Deficits:  Unable to perform ADLs independently Unable to perform IADLs independently        Follow Up Plan: SW will follow up with patient by phone over the next 14 business days      [employer], LCSW Clinical Social Worker  [facility] Care Management [phone]

## 2020-08-17 ENCOUNTER — Telehealth: Payer: Self-pay

## 2020-08-17 NOTE — Telephone Encounter (Signed)
Copied from CRM [id number]. Topic: General - Other >> Aug 17, 2020 11:05 AM Gaetana Michaelis A wrote: Reason for CRM: Patient's spouse would like for patient  to be prescribed medication to assist with sleeping   The patient has been previously prescribed Quetiapine fumarate by their neurologist but does not feet that it is effective   Patient has an appointment scheduled for 09/25/20  Please contact to advise further

## 2020-08-23 ENCOUNTER — Ambulatory Visit: Payer: Self-pay | Admitting: *Deleted

## 2020-08-23 DIAGNOSIS — F329 Major depressive disorder, single episode, unspecified: Secondary | ICD-10-CM

## 2020-08-23 DIAGNOSIS — R69 Illness, unspecified: Secondary | ICD-10-CM | POA: Diagnosis not present

## 2020-08-23 DIAGNOSIS — I10 Essential (primary) hypertension: Secondary | ICD-10-CM

## 2020-08-23 DIAGNOSIS — F0150 Vascular dementia without behavioral disturbance: Secondary | ICD-10-CM

## 2020-08-23 DIAGNOSIS — F0153 Vascular dementia, unspecified severity, with mood disturbance: Secondary | ICD-10-CM

## 2020-08-23 DIAGNOSIS — F0391 Unspecified dementia with behavioral disturbance: Secondary | ICD-10-CM

## 2020-08-23 NOTE — Patient Instructions (Signed)
Visit Information   Goals Addressed             This Visit's Progress    Care for the Caregiver-Dementia       Timeframe:  Long-Range Goal Priority:  High Start Date:    06/29/20                         Expected End Date:  12/30/20                 Follow Up Date 09/07/20   - do one enjoyable thing every day - eat healthy - exercise at least 2 to 3 times per week - get at least 7 to 8 hours of sleep at night - develop a new routine to improve sleep  -continued medication adherence and follow up with Neurologist -caregiver-continue self management strategies: accepting  assistance for patient care, continued emphasis on self care   Why is this important?   People who care for a person with dementia often feel stress.  Too much stress can be harmful to both of you.  You may feel depressed, exhausted, have trouble sleeping or have health problems.  You need to take care of yourself and reach out for help when you need it.  There are some things that can be done to keep the quality of your life satisfying.     Notes:          The patient verbalized understanding of instructions, educational materials, and care plan provided today and declined offer to receive copy of patient instructions, educational materials, and care plan.   Telephone follow up appointment with care management team member scheduled for:09/07/20  Verna Czech, LCSW Clinical Social Worker  [facility] Care Management [phone]

## 2020-08-23 NOTE — Chronic Care Management (AMB) (Signed)
  Chronic Care Management    Clinical Social Work Note  08/23/2020 Name: Sean Phelps MRN: [id number] DOB: [birth year]  Sean Phelps is a 80 y.o. year old male who is a primary care patient of Alba Cory, MD. The CCM team was consulted to assist the patient with chronic disease management and/or care coordination needs related to: [employer] .   Engaged with patient's spouse by telephone for follow up visit in response to provider referral for social work chronic care management and care coordination services.   Consent to Services:  The patient was given information about Chronic Care Management services, agreed to services, and gave verbal consent prior to initiation of services.  Please see initial visit note for detailed documentation.   Patient agreed to services and consent obtained.   Assessment: Review of patient past medical history, allergies, medications, and health status, including review of relevant consultants reports was performed today as part of a comprehensive evaluation and provision of chronic care management and care coordination services.     SDOH (Social Determinants of Health) assessments and interventions performed:    Advanced Directives Status: Not addressed in this encounter.  CCM Care Plan  Allergies  Allergen Reactions   Brinzolamide-Brimonidine Other (See Comments)    Red and itchy    Outpatient Encounter Medications as of 08/23/2020  Medication Sig   aspirin 81 MG tablet Take 81 mg by mouth daily.   Cyanocobalamin (VITAMIN B-12) 1000 MCG SUBL Place 1 each under the tongue daily.   losartan (COZAAR) 25 MG tablet Take 0.5 tablets (12.5 mg total) by mouth daily.   No facility-administered encounter medications on file as of 08/23/2020.    Patient Active Problem List   Diagnosis Date Noted   B12 deficiency 05/07/2020   Delusions (HCC) 05/07/2020   Hallucination 05/07/2020   TSH (thyroid-stimulating hormone deficiency) 05/07/2020    Moderate dementia, with behavioral disturbance (HCC) 01/20/2017   Loss of memory 12/03/2016   Thoracic aortic atherosclerosis (HCC) 07/20/2016   Corneal edema of right eye 05/19/2016   Pseudophakia of right eye 04/09/2016   Status post cataract extraction and insertion of intraocular lens of left eye 04/09/2016   Primary open angle glaucoma of right eye, severe stage 08/10/2015   Status post eye surgery 08/10/2015   Recurrent major depression-severe (HCC) 07/13/2015   Hypertension, benign 07/13/2015   Hyperlipidemia 07/13/2015   Cataract 07/13/2015   Abnormal brain MRI 07/13/2015   Legally blind in right eye, as defined in [place] 07/13/2015   Dementia, vascular, with depression (HCC) 09/05/2014   Benign enlargement of prostate 03/22/2009    Conditions to be addressed/monitored: Dementia; ADL IADL limitations  There are no care plans that you recently modified to display for this patient.    Follow Up Plan: SW will follow up with patient by phone over the next 14 business days       Toniesha Zellner, [place] Clinical Social Worker  [facility] Care Management [phone]

## 2020-08-24 ENCOUNTER — Telehealth: Payer: Self-pay | Admitting: *Deleted

## 2020-08-30 DIAGNOSIS — G309 Alzheimer's disease, unspecified: Secondary | ICD-10-CM | POA: Diagnosis not present

## 2020-08-30 DIAGNOSIS — R531 Weakness: Secondary | ICD-10-CM | POA: Diagnosis not present

## 2020-08-30 DIAGNOSIS — R5383 Other fatigue: Secondary | ICD-10-CM | POA: Diagnosis not present

## 2020-08-30 DIAGNOSIS — R2689 Other abnormalities of gait and mobility: Secondary | ICD-10-CM | POA: Diagnosis not present

## 2020-08-30 DIAGNOSIS — R296 Repeated falls: Secondary | ICD-10-CM | POA: Diagnosis not present

## 2020-08-30 DIAGNOSIS — R69 Illness, unspecified: Secondary | ICD-10-CM | POA: Diagnosis not present

## 2020-08-30 DIAGNOSIS — R29898 Other symptoms and signs involving the musculoskeletal system: Secondary | ICD-10-CM | POA: Diagnosis not present

## 2020-09-05 ENCOUNTER — Ambulatory Visit: Payer: Medicare HMO | Admitting: Family Medicine

## 2020-09-06 ENCOUNTER — Ambulatory Visit (INDEPENDENT_AMBULATORY_CARE_PROVIDER_SITE_OTHER): Payer: Medicare HMO

## 2020-09-06 DIAGNOSIS — R69 Illness, unspecified: Secondary | ICD-10-CM | POA: Diagnosis not present

## 2020-09-06 DIAGNOSIS — F329 Major depressive disorder, single episode, unspecified: Secondary | ICD-10-CM | POA: Diagnosis not present

## 2020-09-06 DIAGNOSIS — F0150 Vascular dementia without behavioral disturbance: Secondary | ICD-10-CM

## 2020-09-06 DIAGNOSIS — F0391 Unspecified dementia with behavioral disturbance: Secondary | ICD-10-CM | POA: Diagnosis not present

## 2020-09-06 DIAGNOSIS — I10 Essential (primary) hypertension: Secondary | ICD-10-CM | POA: Diagnosis not present

## 2020-09-06 DIAGNOSIS — Z9181 History of falling: Secondary | ICD-10-CM

## 2020-09-06 NOTE — Patient Instructions (Addendum)
Thank you for allowing the Chronic Care Management team to participate in your care.    Goals Addressed: Patient Care Plan: Hypertension (Adult)  Completed 09/20/2020   Problem Identified: Hypertension (Hypertension) Resolved 09/06/2020     Long-Range Goal: Hypertension Monitored Completed 09/06/2020  Start Date: 06/27/2020  Expected End Date: 09/25/2020   Current Barriers:  Chronic Disease Management support and educational needs r/t Hypertension  Unable to manage medication independently Unable to perform ADL's or IADL's independently  Case Manager Clinical Goal(s):  Over the next 120 days, Caregivers will ensure patient adheres to prescribed treatment plan as evidenced by taking all medications as prescribed, monitoring and recording blood pressure, and adhering to a cardiac prudent/heart healthy diet.    Interventions: Collaboration with Steele Sizer, MD regarding development and update of comprehensive plan of care as evidenced by provider attestation and co-signature Inter-disciplinary care team collaboration (see longitudinal plan of care) Reviewed medications and compliance with treatment. Spouse continues to prepare his daily medications. Reports he is tolerating the prescribed regimen without concerns.  Reviewed established blood pressure parameters. Encouraged to continue monitoring and record readings. Spouse reports his readings have remained within range. Reviewed nutritional intake and compliance with a heart healthy/cardiac prudent diet. Reports very good nutritional intake. His spouse does express concerns regarding his fluid intake. Reports ensuring that he remains hydrated. However he does require prompting. Spouse declined need for nutritional resources. Reviewed s/sx of heart attack, stroke and worsening symptoms that require immediate medical attention.    Patient Goals/Self-Care Activities: -Caregivers will ensure patient takes medications as  prescribed -Caregivers will ensure patient attends all scheduled provider appointments -Caregivers will monitor BP routinely and record readings -Caregivers will ensure patient adheres to the recommended cardiac prudent/heart healthy diet -Notify provider or care management team with questions and new concerns as needed   Goal Met     Patient Care Plan: Fall Risk (Adult)     Problem Identified: Fall Risk      Long-Range Goal: Absence of Fall and Fall-Related Injury   Start Date: 08/01/2020  Expected End Date: 10/30/2020  Priority: High   Current Barriers:  Risk For Falls  Unable to perform ADLs independently Unable to perform IADLs independently   Clinical Goal(s):  Over the next 90 days, patient will not experience falls or require hospitalization/emergent care due to fall related injuries.   Interventions:  Collaboration with Steele Sizer, MD regarding development and update of comprehensive plan of care as evidenced by provider attestation and co-signature Inter-disciplinary care team collaboration (see longitudinal plan of care) Reviewed safety and fall prevention measures. Discussed falls since last outreach. Per spouse, he has not experienced falls since the last outreach. She notes several changes in his behavior that increases his risk for falls. Reports he has not attempted to leave the home but constantly wanders in the home during the day and especially at night. Also notes that he is usually disoriented at night and tends to urinate in random areas if unsupervised. She works during the day and confirms finding a Actuary to avoid leaving him home alone. Reports he uses his walker as prompted. Discussed functional status and possible need for additional assistance. Reports he is still able to perform small tasks when prompted and appears to do well with his sitter.  Reports she is also considering enrolling him in a  Adult Day program. Declines need for higher level of  care or facility placement.    Patient Goals/Self-Care Activities Ensure pathways are clear and well  lit Use appropriate assistive device when needed Ensure patient is wearing non-skid footwear to prevent accidental falls and injuries Continue to provide assistance with ADL'S and IADL'S as needed. Update the care management team if additional assistance is needed.   Follow Up Plan:  Will follow up next month        Mr. Modesto's spouse verbalized understanding of the information discussed during the telephonic outreach. Declined need for mailed/printed instructions. A member of the care management team will follow up next month.    [facility] Care [facility] [place] [phone]

## 2020-09-06 NOTE — Chronic Care Management (AMB) (Signed)
Chronic Care Management   CCM RN Visit Note  09/06/2020 Name: Sean Phelps MRN: [id number] DOB: 29-Nov-1940  Subjective: Sean Phelps is a 80 y.o. year old male who is a primary care patient of Steele Sizer, MD. The care management team was consulted for assistance with disease management and care coordination needs.    Engaged with patient's spouse/caregiver by telephone for follow up visit in response to provider referral for case management and care coordination services.   Consent to Services:  The patient's spouse/caregiver was given information about Chronic Care Management services, agreed to services, and gave verbal consent prior to initiation of services.  Please see initial visit note for detailed documentation.      Assessment: Review of patient's past medical history, allergies, medications, health status, including review of consultants reports, laboratory and other test data, was performed as part of comprehensive evaluation and provision of chronic care management services.   SDOH (Social Determinants of Health) assessments and interventions performed: No    CCM Care Plan  Allergies  Allergen Reactions   Brinzolamide-Brimonidine Other (See Comments)    Red and itchy    Outpatient Encounter Medications as of 09/06/2020  Medication Sig   aspirin 81 MG tablet Take 81 mg by mouth daily.   Cyanocobalamin (VITAMIN B-12) 1000 MCG SUBL Place 1 each under the tongue daily.   losartan (COZAAR) 25 MG tablet Take 0.5 tablets (12.5 mg total) by mouth daily.   No facility-administered encounter medications on file as of 09/06/2020.    Patient Active Problem List   Diagnosis Date Noted   B12 deficiency 05/07/2020   Delusions ([place]) 05/07/2020   Hallucination 05/07/2020   TSH (thyroid-stimulating hormone deficiency) 05/07/2020   Moderate dementia, with behavioral disturbance ([place]) 01/20/2017   Loss of memory 12/03/2016   Thoracic aortic atherosclerosis ([place])  07/20/2016   Corneal edema of right eye 05/19/2016   Pseudophakia of right eye 04/09/2016   Status post cataract extraction and insertion of intraocular lens of left eye 04/09/2016   Primary open angle glaucoma of right eye, severe stage 08/10/2015   Status post eye surgery 08/10/2015   Recurrent major depression-severe ([place]) 07/13/2015   Hypertension, benign 07/13/2015   Hyperlipidemia 07/13/2015   Cataract 07/13/2015   Abnormal brain MRI 07/13/2015   Legally blind in right eye, as defined in [place] 07/13/2015   Dementia, vascular, with depression ([place]) 09/05/2014   Benign enlargement of prostate 03/22/2009    Conditions to be addressed/monitored:  HTN, Dementia, and Fall Risk. Patient Care Plan: Hypertension (Adult)  Completed 09/20/2020   Problem Identified: Hypertension (Hypertension) Resolved 09/06/2020     Long-Range Goal: Hypertension Monitored Completed 09/06/2020  Start Date: 06/27/2020  Expected End Date: 09/25/2020   Current Barriers:  Chronic Disease Management support and educational needs r/t Hypertension  Unable to manage medication independently Unable to perform ADL's or IADL's independently  Case Manager Clinical Goal(s):  Over the next 120 days, Caregivers will ensure patient adheres to prescribed treatment plan as evidenced by taking all medications as prescribed, monitoring and recording blood pressure, and adhering to a cardiac prudent/heart healthy diet.    Interventions: Collaboration with Steele Sizer, MD regarding development and update of comprehensive plan of care as evidenced by provider attestation and co-signature Inter-disciplinary care team collaboration (see longitudinal plan of care) Reviewed medications and compliance with treatment. Spouse continues to prepare his daily medications. Reports he is tolerating the prescribed regimen without concerns.  Reviewed established blood pressure parameters. Encouraged to continue monitoring  and record  readings. Spouse reports his readings have remained within range. Reviewed nutritional intake and compliance with a heart healthy/cardiac prudent diet. Reports very good nutritional intake. His spouse does express concerns regarding his fluid intake. Reports ensuring that he remains hydrated. However he does require prompting. Spouse declined need for nutritional resources. Reviewed s/sx of heart attack, stroke and worsening symptoms that require immediate medical attention.    Patient Goals/Self-Care Activities: -Caregivers will ensure patient takes medications as prescribed -Caregivers will ensure patient attends all scheduled provider appointments -Caregivers will monitor BP routinely and record readings -Caregivers will ensure patient adheres to the recommended cardiac prudent/heart healthy diet -Notify provider or care management team with questions and new concerns as needed   Goal Met     Patient Care Plan: Fall Risk (Adult)     Problem Identified: Fall Risk      Long-Range Goal: Absence of Fall and Fall-Related Injury   Start Date: 08/01/2020  Expected End Date: 10/30/2020  Priority: High   Current Barriers:  Risk For Falls  Unable to perform ADLs independently Unable to perform IADLs independently   Clinical Goal(s):  Over the next 90 days, patient will not experience falls or require hospitalization/emergent care due to fall related injuries.   Interventions:  Collaboration with Steele Sizer, MD regarding development and update of comprehensive plan of care as evidenced by provider attestation and co-signature Inter-disciplinary care team collaboration (see longitudinal plan of care) Reviewed safety and fall prevention measures. Discussed falls since last outreach. Per spouse, he has not experienced falls since the last outreach. She notes several changes in his behavior that increases his risk for falls. Reports he has not attempted to leave the home but constantly  wanders in the home during the day and especially at night. Also notes that he is usually disoriented at night and tends to urinate in random areas if unsupervised. She works during the day and confirms finding a Actuary to avoid leaving him home alone. Reports he uses his walker as prompted. Discussed functional status and possible need for additional assistance. Reports he is still able to perform small tasks when prompted and appears to do well with his sitter.  Reports she is also considering enrolling him in a  Adult Day program. Declines need for higher level of care or facility placement.    Patient Goals/Self-Care Activities Ensure pathways are clear and well lit Use appropriate assistive device when needed Ensure patient is wearing non-skid footwear to prevent accidental falls and injuries Continue to provide assistance with ADL'S and IADL'S as needed. Update the care management team if additional assistance is needed.   Follow Up Plan:  Will follow up next month       PLAN A member of the care management team will follow up next month.    [facility] [phone]

## 2020-09-07 ENCOUNTER — Ambulatory Visit: Payer: Self-pay | Admitting: *Deleted

## 2020-09-07 DIAGNOSIS — F0150 Vascular dementia without behavioral disturbance: Secondary | ICD-10-CM

## 2020-09-07 DIAGNOSIS — F0391 Unspecified dementia with behavioral disturbance: Secondary | ICD-10-CM

## 2020-09-07 DIAGNOSIS — F0153 Vascular dementia, unspecified severity, with mood disturbance: Secondary | ICD-10-CM

## 2020-09-07 NOTE — Chronic Care Management (AMB) (Signed)
Chronic Care Management    Clinical Social Work Note  09/07/2020 Name: Sean Phelps MRN: [id number] DOB: 30-Jan-1941  Sean Phelps is a 80 y.o. year old male who is a primary care patient of Alba Cory, MD. The CCM team was consulted to assist the patient with chronic disease management and/or care coordination needs related to: Mental Health Counseling and Resources.   Engaged with patient's spouse by telephone for follow up visit in response to provider referral for social work chronic care management and care coordination services.   Consent to Services:  The patient was given information about Chronic Care Management services, agreed to services, and gave verbal consent prior to initiation of services.  Please see initial visit note for detailed documentation.   Patient agreed to services and consent obtained.   Assessment: Review of patient past medical history, allergies, medications, and health status, including review of relevant consultants reports was performed today as part of a comprehensive evaluation and provision of chronic care management and care coordination services.     SDOH (Social Determinants of Health) assessments and interventions performed:    Advanced Directives Status: Not addressed in this encounter.  CCM Care Plan  Allergies  Allergen Reactions   Brinzolamide-Brimonidine Other (See Comments)    Red and itchy    Outpatient Encounter Medications as of 09/07/2020  Medication Sig   aspirin 81 MG tablet Take 81 mg by mouth daily.   Cyanocobalamin (VITAMIN B-12) 1000 MCG SUBL Place 1 each under the tongue daily.   losartan (COZAAR) 25 MG tablet Take 0.5 tablets (12.5 mg total) by mouth daily.   No facility-administered encounter medications on file as of 09/07/2020.    Patient Active Problem List   Diagnosis Date Noted   B12 deficiency 05/07/2020   Delusions ([facility]) 05/07/2020   Hallucination 05/07/2020   TSH (thyroid-stimulating hormone  deficiency) 05/07/2020   Moderate dementia, with behavioral disturbance ([facility]) 01/20/2017   Loss of memory 12/03/2016   Thoracic aortic atherosclerosis ([facility]) 07/20/2016   Corneal edema of right eye 05/19/2016   Pseudophakia of right eye 04/09/2016   Status post cataract extraction and insertion of intraocular lens of left eye 04/09/2016   Primary open angle glaucoma of right eye, severe stage 08/10/2015   Status post eye surgery 08/10/2015   Recurrent major depression-severe ([facility]) 07/13/2015   Hypertension, benign 07/13/2015   Hyperlipidemia 07/13/2015   Cataract 07/13/2015   Abnormal brain MRI 07/13/2015   Legally blind in right eye, as defined in [place] 07/13/2015   Dementia, vascular, with depression ([facility]) 09/05/2014   Benign enlargement of prostate 03/22/2009    Conditions to be addressed/monitored: Dementia; Memory Deficits, Inability to perform ADL's independently, and Inability to perform IADL's independently  Care Plan : Dementia (Adult)  Updates made by Wenda Overland, LCSW since 09/07/2020 12:00 AM     Problem: Caregiver Stress      Long-Range Goal: Caregiver Coping Optimized   Start Date: 06/29/2020  Expected End Date: 12/31/2020  This Visit's Progress: On track  Recent Progress: On track  Priority: Medium  Note:   Current Barriers:  Caregiver stress due to patients Dementia symptoms ADL IADL limitations Suicidal Ideation/Homicidal Ideation: No  Clinical Social Work Goal(s):  Over the next 90 days, patient's spouse will work with SW bi-weekly by telephone or in person to reduce or manage symptoms related to caregiver stress  Interventions:   Patient's spouse continues to discussed increased stress due to patient's activity now at night. Patient sleeping during  the day, up pacing through the night needing frequent redirection Patient's spouse confirmed that she has discussed her concerns and  medication adjustments with Neurologist during appointment last Thursday and  will follow their recommendations Confirmed continued plan to have patient remain in the home, with caregiver support by spouse and daughters.-they have also hired an aid to sit with patient while she works  Water engineer continued to be emphasized-progression of patient's condition discussed  Caregiver stress continue to be acknowledged-emphasized the need for self care-spouse continues to verbalize positive self management strategies(continues to work part-time, spending time with family) Active listening / Reflection utilized  Emotional Supportive Provided, verbalization of feeling encouraged  Patient Self Care Activities:  Strong family or social support  Patient Coping Strengths:  Risk manager  Patient Self Care Deficits:  Unable to perform ADLs independently Unable to perform IADLs independently        Follow Up Plan: SW will follow up with patient by phone over the next 7-14 business days      [employer], LCSW Clinical Social Worker  [facility] Care Management [phone]

## 2020-09-07 NOTE — Patient Instructions (Signed)
Visit Information   Goals Addressed               This Visit's Progress     per patient's spouse "I would like him to remain active as much as possible" (pt-stated)        Long-term goal Start Date: 06/29/20 Follow Up Date 07/13/20 Expected End Date:12/30/20   - continue to check out options for in-home care and assistance - discuss treatment options with the doctor or nurse during next appointment  -continue to practice self care-(caregiver)   Why is this important?   Having a long-term illness can be scary.  It can also be stressful for you and your caregiver.  These steps may help.    Notes: Patient's spouse continues to  encourage patient to participate in outdoor activities. Patient continues to decline participation in Day Programs and outdoor activities without his spouse being present . Community Resources remain available when needed        The patient verbalized understanding of instructions, educational materials, and care plan provided today and declined offer to receive copy of patient instructions, educational materials, and care plan.   Telephone follow up appointment with care management team member scheduled for: 09/14/20  Verna Czech, LCSW Clinical Social Worker  [facility] Management [phone]

## 2020-09-14 ENCOUNTER — Telehealth: Payer: Self-pay

## 2020-09-18 ENCOUNTER — Telehealth: Payer: Self-pay

## 2020-09-18 NOTE — Progress Notes (Signed)
Chronic Care Management Pharmacy Assistant   Name: Sean Phelps  MRN: [id number] DOB: June 19, 1940   Reason for Encounter: Hypertension Disease State Call.   Recent office visits:  09/07/2020 Chrystal Land LCSW (CCM) 09/06/2020 Juanell Fairly RN (CCM) 08/23/2020 Chrystal Land LCSW (CCM) 08/10/2020 Chrystal Land LCSW (CCM) 08/01/2020 Juanell Fairly RN (CCM) 07/27/2020 Chrystal Land LCSW (CCM) 07/13/2020  Chrystal Land LCSW (CCM) 06/29/2020 Chrystal Land LCSW (CCM) 06/27/2020 Juanell Fairly RN (CCM) 06/25/2020 Dr. Carlynn Purl MD (PCP) decrease Losartan to 12.5 mg daily  Recent consult visits:  08/30/2020 Nilda Calamity PA (Neurology) Trial Sinemet for parkinsonism. Take one 25/100mg  tablet once a day for one week then increase to twice a day.Recommend referral for either hospice or home health care. Wife will discuss with family and call back.Do not recommend restarting memory medications at this time 07/20/2020 Nilda Calamity PA (Neurology) Start seroquel 12.5mg  nightly (1/2 tablet) 06/28/2020 Theora Master MD (Neurology) start depakote 125mg  once a day 06/18/2020 06/20/2020 MD (Neurology)Start B12 1000 mcg per day,Stop Namenda, Aricept, and Celexa to see if this is helps with daytime fatigue.  Hospital visits:  None in previous 6 months  Medications: Outpatient Encounter Medications as of 09/18/2020  Medication Sig   aspirin 81 MG tablet Take 81 mg by mouth daily.   Cyanocobalamin (VITAMIN B-12) 1000 MCG SUBL Place 1 each under the tongue daily.   losartan (COZAAR) 25 MG tablet Take 0.5 tablets (12.5 mg total) by mouth daily.   No facility-administered encounter medications on file as of 09/18/2020.    Care Gaps: Shingrix Vaccine COVID-19 Vaccine Star Rating Drugs: Losartan 25 mg last filled 06/16/2020 for 90 day supply at [facility]. Medication Fill Gaps: N/A  Reviewed chart prior to disease state call. Spoke with patient regarding BP  Recent Office Vitals: BP  Readings from Last 3 Encounters:  06/25/20 110/62  05/28/20 116/62  05/16/20 (!) 146/73   Pulse Readings from Last 3 Encounters:  06/25/20 82  05/28/20 62  05/16/20 63    Wt Readings from Last 3 Encounters:  06/25/20 102 lb (46.3 kg)  05/28/20 104 lb 3.2 oz (47.3 kg)  05/15/20 105 lb (47.6 kg)     Kidney Function Lab Results  Component Value Date/Time   CREATININE 1.01 05/15/2020 08:59 PM   CREATININE 0.75 06/02/2019 10:00 AM   CREATININE 0.84 12/01/2018 12:00 AM   GFRNONAA >60 05/15/2020 08:59 PM   GFRNONAA 86 06/02/2019 10:00 AM   GFRAA 100 06/02/2019 10:00 AM    BMP Latest Ref Rng & Units 05/15/2020 06/02/2019 12/01/2018  Glucose 70 - 99 mg/dL 99 86 90  BUN 8 - 23 mg/dL 18 17 14   Creatinine 0.61 - 1.24 mg/dL 01/31/2019 0.93  BUN/Creat Ratio 6 - 22 (calc) - NOT APPLICABLE NOT APPLICABLE  Sodium 135 - 145 mmol/L 138 140 140  Potassium 3.5 - 5.1 mmol/L 4.2 3.8 4.4  Chloride 98 - 111 mmol/L 102 105 104  CO2 22 - 32 mmol/L 28 30 29   Calcium 8.9 - 10.3 mg/dL 9.0 9.3 9.6    Current antihypertensive regimen:  Losartan 25 mg daily How often are you checking your Blood Pressure?  Patient wife states she has not check his blood pressure at home because she does not know how to use the machine.I ask patient wife if she would like to schedule a office visit with the clinical pharmacist to show her how to use the machine or I could try to explain it over the phone.Patient wife denies  making a office appointment at this time.Patient wife states she will try to figure it out. Current home BP readings: N/A What recent interventions/DTPs have been made by any provider to improve Blood Pressure control since last CPP Visit: None ID Any recent hospitalizations or ED visits since last visit with CPP? No What diet changes have been made to improve Blood Pressure Control?  Patient wife's states patient eats good, but his feet are swollen all the time because he walks all the time around the  house. What exercise is being done to improve your Blood Pressure Control?  Patient wife reports patient walks all the time around the house.  Patient wife states she try's to get her husband to prop up his feet to help with swelling but he refuses to do it. Patient wife states patient sleep all day and awake all night.Patient states she ask his PCP for something to help with sleep and PCP inform her to contact his Neurologist.  Adherence Review: Is the patient currently on ACE/ARB medication? Yes Does the patient have >5 day gap between last estimated fill dates? No   Everlean Cherry Clinical Lobbyist [phone]

## 2020-09-21 ENCOUNTER — Other Ambulatory Visit: Payer: Self-pay | Admitting: Family Medicine

## 2020-09-21 DIAGNOSIS — I10 Essential (primary) hypertension: Secondary | ICD-10-CM

## 2020-09-24 ENCOUNTER — Other Ambulatory Visit: Payer: Self-pay | Admitting: Family Medicine

## 2020-09-24 DIAGNOSIS — R404 Transient alteration of awareness: Secondary | ICD-10-CM | POA: Diagnosis not present

## 2020-09-24 DIAGNOSIS — I10 Essential (primary) hypertension: Secondary | ICD-10-CM

## 2020-09-24 NOTE — Progress Notes (Signed)
Name: Sean Phelps   MRN: [id number]    DOB: 03-Oct-1940   Date:09/25/2020       Progress Note  Subjective  Chief Complaint  Follow Up  HPI  HTN: he is taking medication daily for bp but his bp has been low. He also has microalbuminuria and has been on low dose ARB but he has been falling more    Dementia: He was getting lethargic on Citalopram, Namenda and Aricept, wife states still has his baseline confusion, forgetfulness, but since stopping medication he seems more awake. He is also legally blind He is getting more stiff, falling more often, small shuffling gait , went wondering outside the house yesterday and locked himself out. Discussed placement at nursing home but wife refuses it at this time. Explained that is dangerous to keep him at home . Advised to try enrolling him on Medicaid and we can try personal care services He is currently on seroquel for delusions and sinemet for parkinsonian features.    Malnutrition: he has Meals on Wheels and is taking Remeron did not work, they are trying a high calorie diet, weight is stable.    Depression: hard to tell, he was napping during the visit, Phq 9 was filled by wife . He is on Seroquel to help with behavior and sleep at night  Recurrent falls: feel backwards recently, also tries to get up and walk without help and falls. His gait has deteriorated. Discussed again importance of placement, they want to try keeping him at home ( wife and daughters), we will try getting a hospital bed with rails and she will use a raised toilet seat , currently only able to take sponge baths due to fear or water   Atherosclerosis or aorta: not on statin therapy but taking aspirin   Patient Active Problem List   Diagnosis Date Noted   B12 deficiency 05/07/2020   Delusions (HCC) 05/07/2020   Hallucination 05/07/2020   TSH (thyroid-stimulating hormone deficiency) 05/07/2020   Moderate dementia, with behavioral disturbance (HCC) 01/20/2017   Loss of  memory 12/03/2016   Thoracic aortic atherosclerosis (HCC) 07/20/2016   Corneal edema of right eye 05/19/2016   Pseudophakia of right eye 04/09/2016   Status post cataract extraction and insertion of intraocular lens of left eye 04/09/2016   Primary open angle glaucoma of right eye, severe stage 08/10/2015   Status post eye surgery 08/10/2015   Major depression, recurrent (HCC) 07/13/2015   Hypertension, benign 07/13/2015   Hyperlipidemia 07/13/2015   Cataract 07/13/2015   Abnormal brain MRI 07/13/2015   Legally blind in right eye, as defined in [place] 07/13/2015   Dementia, vascular, with depression (HCC) 09/05/2014   Benign enlargement of prostate 03/22/2009    Past Surgical History:  Procedure Laterality Date   CATARACT EXTRACTION Right    EYE SURGERY      Family History  Problem Relation Age of Onset   Leukemia Father    Emphysema Father    Alcohol abuse Mother     Social History   Tobacco Use   Smoking status: Former    Years: 25.00    Types: Cigarettes    Quit date: 12/25/1965    Years since quitting: 54.7   Smokeless tobacco: Never  Substance Use Topics   Alcohol use: No    Alcohol/week: 0.0 standard drinks     Current Outpatient Medications:    Cyanocobalamin (VITAMIN B-12) 1000 MCG SUBL, Place 1 each under the tongue daily., Disp: , Rfl:  carbidopa-levodopa (SINEMET IR) 25-100 MG tablet, Take 1 tablet by mouth 2 (two) times daily., Disp: , Rfl:    QUEtiapine (SEROQUEL) 25 MG tablet, Take 25 mg by mouth at bedtime., Disp: , Rfl:   Allergies  Allergen Reactions   Brinzolamide-Brimonidine Other (See Comments)    Red and itchy    I personally reviewed active problem list, medication list, allergies, family history, social history with the patient/caregiver today.   ROS  Ten systems reviewed and is negative except as mentioned in HPI  Very difficulty to understand him, caregivers gave information   Objective  Vitals:   09/25/20 1114  BP: 110/60   Pulse: 67  Resp: 16  Temp: 98.6 F (37 C)  SpO2: 97%  Weight: 102 lb (46.3 kg)  Height: 5\' 1"  (1.549 m)    Body mass index is 19.27 kg/m.  Physical Exam  Constitutional: Patient appears gtsil, malnourished. No distress.  HEENT: head atraumatic, normocephalic, eyes are opaque, pupils not reactive neck supple Cardiovascular: Normal rate, regular rhythm and normal heart sounds.  No murmur heard. No BLE edema. Pulmonary/Chest: Effort normal and breath sounds normal. No respiratory distress. Abdominal: Soft.  There is no tenderness. Muscular skeletal: shuffling gait, very stiff on his knee and difficulty sitting down Psychiatric: Patient is cooperative , but trying to get up from examining table and difficulty communicating   PHQ2/9: Depression screen [facility] 2/9 09/25/2020 06/27/2020 06/25/2020 06/07/2020 05/28/2020  Decreased Interest 0 3 3 0 0  Down, Depressed, Hopeless 0 - 3 0 0  PHQ - 2 Score 0 3 6 0 0  Altered sleeping 0 - 1 - -  Tired, decreased energy 0 - 0 - -  Change in appetite 0 - 0 - -  Feeling bad or failure about yourself  0 - 1 - -  Trouble concentrating 0 - 0 - -  Moving slowly or fidgety/restless 0 - 3 - -  Suicidal thoughts 0 - 1 - -  PHQ-9 Score 0 - 12 - -  Difficult doing work/chores - - - - -  Some recent data might be hidden    phq 9 is negative   Fall Risk: Fall Risk  09/25/2020 09/06/2020 08/01/2020 06/27/2020 06/25/2020  Falls in the past year? 1 1 1  0 0  Number falls in past yr: 1 1 1  0 0  Injury with Fall? 0 0 0 - 0  Comment - - - - -  Risk for fall due to : - Impaired mobility;History of fall(s);Mental status change Impaired mobility;History of fall(s) Impaired vision -  Risk for fall due to: Comment - - Spouse reports two falls within the last month - -  Follow up - Falls prevention discussed Falls prevention discussed Falls prevention discussed -     Functional Status Survey: Is the patient deaf or have difficulty hearing?: Yes Does the patient have  difficulty seeing, even when wearing glasses/contacts?: Yes Does the patient have difficulty concentrating, remembering, or making decisions?: Yes Does the patient have difficulty walking or climbing stairs?: Yes Does the patient have difficulty dressing or bathing?: Yes Does the patient have difficulty doing errands alone such as visiting a doctor's office or shopping?: Yes    Assessment & Plan  1. Dementia with behavioral disturbance, unspecified dementia type (HCC)   2. Dementia, vascular, with depression (HCC)   3. Parkinsonian features  Starting on sinemet by neurologist  4. Moderate recurrent major depression (HCC)  Off medication because it cause worsening of lethargy  5.  Legally blind in right eye, as defined in [place]   6. Moderate protein-calorie malnutrition (HCC)  Continue high calorie diet  7. Hypertension, benign  No longer a problem  8. Proteinuria, unspecified type  Stopping ARB due to low bp and falls.   9. Anemia of chronic disease   10. B12 deficiency   11. Delusions (HCC)   12. Thoracic aortic atherosclerosis (HCC)   Stopping aspirin due to recurrent falls

## 2020-09-25 ENCOUNTER — Other Ambulatory Visit: Payer: Self-pay

## 2020-09-25 ENCOUNTER — Encounter: Payer: Self-pay | Admitting: Family Medicine

## 2020-09-25 ENCOUNTER — Ambulatory Visit (INDEPENDENT_AMBULATORY_CARE_PROVIDER_SITE_OTHER): Payer: Medicare HMO | Admitting: Family Medicine

## 2020-09-25 VITALS — BP 110/60 | HR 67 | Temp 98.6°F | Resp 16 | Ht 61.0 in | Wt 102.0 lb

## 2020-09-25 DIAGNOSIS — H548 Legal blindness, as defined in USA: Secondary | ICD-10-CM | POA: Diagnosis not present

## 2020-09-25 DIAGNOSIS — F0391 Unspecified dementia with behavioral disturbance: Secondary | ICD-10-CM | POA: Diagnosis not present

## 2020-09-25 DIAGNOSIS — D638 Anemia in other chronic diseases classified elsewhere: Secondary | ICD-10-CM

## 2020-09-25 DIAGNOSIS — I10 Essential (primary) hypertension: Secondary | ICD-10-CM | POA: Diagnosis not present

## 2020-09-25 DIAGNOSIS — R809 Proteinuria, unspecified: Secondary | ICD-10-CM | POA: Diagnosis not present

## 2020-09-25 DIAGNOSIS — F331 Major depressive disorder, recurrent, moderate: Secondary | ICD-10-CM | POA: Diagnosis not present

## 2020-09-25 DIAGNOSIS — I700 Atherosclerosis of aorta: Secondary | ICD-10-CM

## 2020-09-25 DIAGNOSIS — F0150 Vascular dementia without behavioral disturbance: Secondary | ICD-10-CM | POA: Diagnosis not present

## 2020-09-25 DIAGNOSIS — E440 Moderate protein-calorie malnutrition: Secondary | ICD-10-CM

## 2020-09-25 DIAGNOSIS — F0153 Vascular dementia, unspecified severity, with mood disturbance: Secondary | ICD-10-CM

## 2020-09-25 DIAGNOSIS — F329 Major depressive disorder, single episode, unspecified: Secondary | ICD-10-CM

## 2020-09-25 DIAGNOSIS — F22 Delusional disorders: Secondary | ICD-10-CM

## 2020-09-25 DIAGNOSIS — R259 Unspecified abnormal involuntary movements: Secondary | ICD-10-CM | POA: Diagnosis not present

## 2020-09-25 DIAGNOSIS — E538 Deficiency of other specified B group vitamins: Secondary | ICD-10-CM

## 2020-09-25 DIAGNOSIS — R69 Illness, unspecified: Secondary | ICD-10-CM | POA: Diagnosis not present

## 2020-09-25 DIAGNOSIS — R296 Repeated falls: Secondary | ICD-10-CM

## 2020-09-25 NOTE — Telephone Encounter (Signed)
Last seen 5.2.2022 upcoming appt sch'd today

## 2020-09-25 NOTE — Telephone Encounter (Signed)
Requested medication (s) are due for refill today:   Yes  Has appt today at 11:20 with Dr. Carlynn Purl  Requested medication (s) are on the active medication list:   Yes  Future visit scheduled:   Yes today at 11:20   address during OV?   Last ordered: 06/25/2020 #45, 0 refills  Returned because pt has an appt today with Dr. Carlynn Purl at 11:20   Requested Prescriptions  Pending Prescriptions Disp Refills   losartan (COZAAR) 25 MG tablet [Pharmacy Med Name: Losartan Potassium 25 MG Oral Tablet] 90 tablet 0    Sig: Take 1 tablet by mouth once daily      Cardiovascular:  Angiotensin Receptor Blockers Passed - 09/24/2020  6:29 PM      Passed - Cr in normal range and within 180 days    Creat  Date Value Ref Range Status  06/02/2019 0.75 0.70 - 1.11 mg/dL Final    Comment:    For patients >22 years of age, the reference limit for Creatinine is approximately 13% higher for people identified as African-American. .    Creatinine, Ser  Date Value Ref Range Status  05/15/2020 1.01 0.61 - 1.24 mg/dL Final   Creatinine, Urine  Date Value Ref Range Status  05/28/2020 268 20 - 320 mg/dL Final          Passed - K in normal range and within 180 days    Potassium  Date Value Ref Range Status  05/15/2020 4.2 3.5 - 5.1 mmol/L Final  04/01/2012 3.3 (L) 3.5 - 5.1 mmol/L Final          Passed - Patient is not pregnant      Passed - Last BP in normal range    BP Readings from Last 1 Encounters:  06/25/20 110/62          Passed - Valid encounter within last 6 months    Recent Outpatient Visits           3 months ago Legally blind in right eye, as defined in [place]   [facility] Alba Cory, MD   4 months ago Lethargy   [facility] [place], Danna Hefty, MD   6 months ago Dementia with behavioral disturbance, unspecified dementia type [facility] [place])   [facility] Alba Cory, MD   9 months ago Dementia with behavioral disturbance,  unspecified dementia type [facility]   [facility] Alba Cory, MD   10 months ago Dementia with behavioral disturbance, unspecified dementia type [facility])   [facility] Alba Cory, MD       Future Appointments             Today Alba Cory, MD [facility]

## 2020-10-05 ENCOUNTER — Ambulatory Visit (INDEPENDENT_AMBULATORY_CARE_PROVIDER_SITE_OTHER): Payer: Medicare HMO | Admitting: *Deleted

## 2020-10-05 DIAGNOSIS — F0153 Vascular dementia, unspecified severity, with mood disturbance: Secondary | ICD-10-CM

## 2020-10-05 DIAGNOSIS — F0150 Vascular dementia without behavioral disturbance: Secondary | ICD-10-CM

## 2020-10-05 DIAGNOSIS — F0391 Unspecified dementia with behavioral disturbance: Secondary | ICD-10-CM

## 2020-10-05 NOTE — Chronic Care Management (AMB) (Addendum)
Chronic Care Management    Clinical Social Work Note  10/05/2020 Name: Sean Phelps MRN: [id number] DOB: [birth year]  Sean Phelps is a 80 y.o. year old male who is a primary care patient of Alba Cory, MD. The CCM team was consulted to assist the patient with chronic disease management and/or care coordination needs related to: [employer]  and Level of Care Concerns.   Collaboration with patient's sposue  for follow up visit in response to provider referral for social work chronic care management and care coordination services.   Consent to Services:  The patient was given information about Chronic Care Management services, agreed to services, and gave verbal consent prior to initiation of services.  Please see initial visit note for detailed documentation.   Patient agreed to services and consent obtained.   Assessment: Review of patient past medical history, allergies, medications, and health status, including review of relevant consultants reports was performed today as part of a comprehensive evaluation and provision of chronic care management and care coordination services.     SDOH (Social Determinants of Health) assessments and interventions performed:    Advanced Directives Status: Not addressed in this encounter.  CCM Care Plan  Allergies  Allergen Reactions   Brinzolamide-Brimonidine Other (See Comments)    Red and itchy    Outpatient Encounter Medications as of 10/05/2020  Medication Sig   carbidopa-levodopa (SINEMET IR) 25-100 MG tablet Take 1 tablet by mouth 2 (two) times daily.   Cyanocobalamin (VITAMIN B-12) 1000 MCG SUBL Place 1 each under the tongue daily.   QUEtiapine (SEROQUEL) 25 MG tablet Take 25 mg by mouth at bedtime.   No facility-administered encounter medications on file as of 10/05/2020.    Patient Active Problem List   Diagnosis Date Noted   B12 deficiency 05/07/2020   Delusions (HCC) 05/07/2020   Hallucination 05/07/2020    TSH (thyroid-stimulating hormone deficiency) 05/07/2020   Moderate dementia, with behavioral disturbance (HCC) 01/20/2017   Loss of memory 12/03/2016   Thoracic aortic atherosclerosis (HCC) 07/20/2016   Corneal edema of right eye 05/19/2016   Pseudophakia of right eye 04/09/2016   Status post cataract extraction and insertion of intraocular lens of left eye 04/09/2016   Primary open angle glaucoma of right eye, severe stage 08/10/2015   Status post eye surgery 08/10/2015   Major depression, recurrent (HCC) 07/13/2015   Hypertension, benign 07/13/2015   Hyperlipidemia 07/13/2015   Cataract 07/13/2015   Abnormal brain MRI 07/13/2015   Legally blind in right eye, as defined in [place] 07/13/2015   Dementia, vascular, with depression (HCC) 09/05/2014   Benign enlargement of prostate 03/22/2009    Conditions to be addressed/monitored: Dementia; Memory Deficits  Care Plan : Dementia (Adult)  Updates made by Wenda Overland, LCSW since 10/05/2020 12:00 AM     Problem: Caregiver Stress      Long-Range Goal: Caregiver Coping Optimized   Start Date: 06/29/2020  Expected End Date: 12/31/2020  Recent Progress: On track  Priority: Medium  Note:   Current Barriers:  Caregiver stress due to patients Dementia symptoms ADL IADL limitations Suicidal Ideation/Homicidal Ideation: No  Clinical Social Work Goal(s):  Over the next 90 days, patient's spouse will work with SW bi-weekly by telephone or in person to reduce or manage symptoms related to caregiver stress  Interventions:   Patient's spouse continues to discussed increased stress due to patient's activity now at night. Patient now wandering outside of the home Patient's spouse confirmed need to have an alarm  placed on the doors for safety and spouse is considering placement in a memory care facility at this time-possibly [facility] Patient's spouse confirmed that she has discussed her concerns during patient's last appointment with his  provider and placement was discussed in order to maintain patient's safety Confirmed that patient's spouse went to the Department of Social Services and was told that she was not eligible for Medicaid This social worker explained the difference between community Medicaid and long term care medicaid which needs to be specified during application process-eligibility guidelines are different Spouse's feelings regarding facility care explored, emotional support/active listening reassurance provided,  placement process after receiving Medicaid reviewed Confirmed continued plan to have patient remain in the home, with caregiver support by spouse ,daughters, and aid while placement is considered/coordinated  safety plan continued to be emphasized- Caregiver stress continues to be acknowledged-emphasized the need for self care-spouse continues to verbalize positive self management strategies   Patient Self Care Activities:  Strong family or social support  Patient Coping Strengths:  Risk manager  Patient Self Care Deficits:  Unable to perform ADLs independently Unable to perform IADLs independently        Follow Up Plan: SW will follow up with patient by phone over the next 14 business days      [employer], LCSW Clinical Social Worker  [facility] Care Management [phone]

## 2020-10-05 NOTE — Patient Instructions (Signed)
Visit Information   Goals Addressed             This Visit's Progress    Care for the Caregiver-Dementia       Timeframe:  Long-Range Goal Priority:  High Start Date:    06/29/20                         Expected End Date:  12/30/20                 Follow Up Date 10/19/20   - do one enjoyable thing every day - eat healthy - exercise at least 2 to 3 times per week - get at least 7 to 8 hours of sleep at night - develop a new routine to improve sleep  -continued medication adherence and follow up with Neurologist -caregiver-continue self management strategies: accepting  assistance for patient care, continued emphasis on self care -Continue to consider memory care -follow up with the Department of Social Services regarding long term care application process   Why is this important?   People who care for a person with dementia often feel stress.  Too much stress can be harmful to both of you.  You may feel depressed, exhausted, have trouble sleeping or have health problems.  You need to take care of yourself and reach out for help when you need it.  There are some things that can be done to keep the quality of your life satisfying.     Notes:         The patient verbalized understanding of instructions, educational materials, and care plan provided today and declined offer to receive copy of patient instructions, educational materials, and care plan.   Telephone follow up appointment with care management team member scheduled for: 10/19/20  Verna Czech, LCSW Clinical Social Worker  [facility] Care Management [phone]

## 2020-10-15 DIAGNOSIS — R29898 Other symptoms and signs involving the musculoskeletal system: Secondary | ICD-10-CM | POA: Diagnosis not present

## 2020-10-15 DIAGNOSIS — R413 Other amnesia: Secondary | ICD-10-CM | POA: Diagnosis not present

## 2020-10-15 DIAGNOSIS — R296 Repeated falls: Secondary | ICD-10-CM | POA: Diagnosis not present

## 2020-10-15 DIAGNOSIS — R441 Visual hallucinations: Secondary | ICD-10-CM | POA: Diagnosis not present

## 2020-10-17 ENCOUNTER — Ambulatory Visit: Payer: Medicare HMO

## 2020-10-17 DIAGNOSIS — F0391 Unspecified dementia with behavioral disturbance: Secondary | ICD-10-CM | POA: Diagnosis not present

## 2020-10-17 DIAGNOSIS — R69 Illness, unspecified: Secondary | ICD-10-CM | POA: Diagnosis not present

## 2020-10-17 DIAGNOSIS — F329 Major depressive disorder, single episode, unspecified: Secondary | ICD-10-CM | POA: Diagnosis not present

## 2020-10-17 DIAGNOSIS — I10 Essential (primary) hypertension: Secondary | ICD-10-CM | POA: Diagnosis not present

## 2020-10-17 DIAGNOSIS — F0150 Vascular dementia without behavioral disturbance: Secondary | ICD-10-CM | POA: Diagnosis not present

## 2020-10-17 DIAGNOSIS — Z9181 History of falling: Secondary | ICD-10-CM

## 2020-10-17 NOTE — Chronic Care Management (AMB) (Signed)
Chronic Care Management   CCM RN Visit Note  10/17/2020 Name: Sean Phelps MRN: [id number] DOB: [birth year]  Subjective: Sean Phelps is a 80 y.o. year old male who is a primary care patient of Sean Cory, MD. The care management team was consulted for assistance with disease management and care coordination needs.    Engaged with patient's spouse/caregiver Sean Phelps by telephone for follow up visit in response to provider referral for case management and/or care coordination services.   Consent to Services:  The patient's spouse was given information about Chronic Care Management services, agreed to services, and gave verbal consent prior to initiation of services.  Please see initial visit note for detailed documentation.    Assessment: Review of patient past medical history, allergies, medications, health status, including review of consultants reports, laboratory and other test data, was performed as part of comprehensive evaluation and provision of chronic care management services.   SDOH (Social Determinants of Health) assessments and interventions performed:  No  CCM Care Plan  Allergies  Allergen Reactions   Brinzolamide-Brimonidine Other (See Comments)    Red and itchy    Outpatient Encounter Medications as of 10/17/2020  Medication Sig Note   carbidopa-levodopa (SINEMET IR) 25-100 MG tablet Take 1 tablet by mouth 2 (two) times daily. (Patient not taking: Reported on 10/17/2020) 10/17/2020: Reports being instructed to discontinue   Cyanocobalamin (VITAMIN B-12) 1000 MCG SUBL Place 1 each under the tongue daily.    QUEtiapine (SEROQUEL) 25 MG tablet Take 25 mg by mouth at bedtime.    No facility-administered encounter medications on file as of 10/17/2020.    Patient Active Problem List   Diagnosis Date Noted   B12 deficiency 05/07/2020   Delusions (HCC) 05/07/2020   Hallucination 05/07/2020   TSH (thyroid-stimulating hormone deficiency) 05/07/2020   Moderate  dementia, with behavioral disturbance (HCC) 01/20/2017   Loss of memory 12/03/2016   Thoracic aortic atherosclerosis (HCC) 07/20/2016   Corneal edema of right eye 05/19/2016   Pseudophakia of right eye 04/09/2016   Status post cataract extraction and insertion of intraocular lens of left eye 04/09/2016   Primary open angle glaucoma of right eye, severe stage 08/10/2015   Status post eye surgery 08/10/2015   Major depression, recurrent (HCC) 07/13/2015   Hypertension, benign 07/13/2015   Hyperlipidemia 07/13/2015   Cataract 07/13/2015   Abnormal brain MRI 07/13/2015   Legally blind in right eye, as defined in [place] 07/13/2015   Dementia, vascular, with depression (HCC) 09/05/2014   Benign enlargement of prostate 03/22/2009    Conditions to be addressed/monitored:Dementia and Fall Risk  Patient Care Plan: Fall Risk (Adult)     Problem Identified: Fall Risk      Long-Range Goal: Absence of Fall and Fall-Related Injury   Start Date: 08/01/2020  Expected End Date: 10/30/2020  Priority: High  Note:    Current Barriers:  Risk For Falls in patient with HTN, HLD and Dementia Unable to perform ADLs independently Unable to perform IADLs independently   Clinical Goal(s):  Over the next 90 days, patient will not experience falls or require hospitalization/emergent care due to fall related injuries.   Interventions:  Collaboration with Sean Cory, MD regarding development and update of comprehensive plan of care as evidenced by provider attestation and co-signature Inter-disciplinary care team collaboration (see longitudinal plan of care) Reviewed safety and fall prevention measures and medication changes.  Discussed falls since last outreach. Patient's spouse Sean Phelps reports a fall since the last outreach. Reports Sean Phelps wandered  outside of the home and fell in the yard. Paramedics were contacted to assess him after the fall. Denies injuries or need for further evaluation. She  reports increased episodes of disorientation throughout the day. Denies hallucinations. Reports he is still able to perform tasks and use an assistive device when prompted. His Seroquel was recently adjusted. She reports administering as prescribed.  We thoroughly discussed plan to ensure locks/devices are strategically placed to prevent Sean Phelps from wandering outside of the home. She reports using alarms since the incident. Reports devices will alarm when Sean Phelps gets out of bed or attempts to leave the home. Discussed long term plan. Spouse confirms that a friend is available to sit with Sean Phelps three days a week. Family is available on the other days. She is pending outreach with the Home Health team for therapy services. Also considering Hospice.  She will confirm with her daughter Sean Phelps. Declines current need for a higher level of care. Agreed to update the care management team if his needs change. Agreed to have her daughter contact the team within the next week regarding plan for Hospice.    Patient Goals/Self-Care Activities Ensure pathways are clear and well lit Use appropriate assistive device when needed Ensure patient is wearing non-skid footwear to prevent accidental falls and injuries Ensure safety alarms are activated Continue to provide assistance with ADL'S and IADL'S as needed Update the care management team if additional assistance is needed   Follow Up Plan:  Will follow up within the next month        PLAN A member of the care management team will follow up within the next month.    [facility] [phone]

## 2020-10-19 ENCOUNTER — Ambulatory Visit: Payer: Medicare HMO | Admitting: *Deleted

## 2020-10-19 DIAGNOSIS — F329 Major depressive disorder, single episode, unspecified: Secondary | ICD-10-CM | POA: Diagnosis not present

## 2020-10-19 DIAGNOSIS — F0391 Unspecified dementia with behavioral disturbance: Secondary | ICD-10-CM

## 2020-10-19 DIAGNOSIS — R69 Illness, unspecified: Secondary | ICD-10-CM | POA: Diagnosis not present

## 2020-10-19 DIAGNOSIS — F0153 Vascular dementia, unspecified severity, with mood disturbance: Secondary | ICD-10-CM

## 2020-10-19 DIAGNOSIS — I10 Essential (primary) hypertension: Secondary | ICD-10-CM | POA: Diagnosis not present

## 2020-10-19 DIAGNOSIS — F0150 Vascular dementia without behavioral disturbance: Secondary | ICD-10-CM | POA: Diagnosis not present

## 2020-10-19 NOTE — Patient Instructions (Signed)
Visit Information   Goals Addressed             This Visit's Progress    Care for the Caregiver-Dementia       Timeframe:  Long-Range Goal Priority:  High Start Date:    06/29/20                         Expected End Date:  12/30/20                 Follow Up Date 11/23/20   - do one enjoyable thing every day - eat healthy - exercise at least 2 to 3 times per week - get at least 7 to 8 hours of sleep at night - develop a new routine to improve sleep  -continued medication adherence and follow up with Neurologist -caregiver-continue self management strategies: accepting  assistance for patient care, continued emphasis on self care -Complete intake with Hospice Care 10/21/20 3 pm   Why is this important?   People who care for a person with dementia often feel stress.  Too much stress can be harmful to both of you.  You may feel depressed, exhausted, have trouble sleeping or have health problems.  You need to take care of yourself and reach out for help when you need it.  There are some things that can be done to keep the quality of your life satisfying.     Notes:         The patient verbalized understanding of instructions, educational materials, and care plan provided today and declined offer to receive copy of patient instructions, educational materials, and care plan.   Telephone follow up appointment with care management team member scheduled for: 11/23/20  Verna Czech, LCSW Clinical Social Worker  [facility] Care Management [phone]

## 2020-10-19 NOTE — Chronic Care Management (AMB) (Signed)
Chronic Care Management    Clinical Social Work Note  10/19/2020 Name: Sean Phelps MRN: [id number] DOB: 07-04-1940  Sean Phelps is a 80 y.o. year old male who is a primary care patient of Alba Cory, MD. The CCM team was consulted to assist the patient with chronic disease management and/or care coordination needs related to: Caregiver Stress.   Collaboration with patient's spouse  for follow up visit in response to provider referral for social work chronic care management and care coordination services.   Consent to Services:  The patient was given information about Chronic Care Management services, agreed to services, and gave verbal consent prior to initiation of services.  Please see initial visit note for detailed documentation.   Patient agreed to services and consent obtained.   Assessment: Review of patient past medical history, allergies, medications, and health status, including review of relevant consultants reports was performed today as part of a comprehensive evaluation and provision of chronic care management and care coordination services.     SDOH (Social Determinants of Health) assessments and interventions performed:    Advanced Directives Status: Not addressed in this encounter.  CCM Care Plan  Allergies  Allergen Reactions   Brinzolamide-Brimonidine Other (See Comments)    Red and itchy    Outpatient Encounter Medications as of 10/19/2020  Medication Sig Note   carbidopa-levodopa (SINEMET IR) 25-100 MG tablet Take 1 tablet by mouth 2 (two) times daily. (Patient not taking: Reported on 10/17/2020) 10/17/2020: Reports being instructed to discontinue   Cyanocobalamin (VITAMIN B-12) 1000 MCG SUBL Place 1 each under the tongue daily.    QUEtiapine (SEROQUEL) 25 MG tablet Take 25 mg by mouth at bedtime.    No facility-administered encounter medications on file as of 10/19/2020.    Patient Active Problem List   Diagnosis Date Noted   B12 deficiency  05/07/2020   Delusions (HCC) 05/07/2020   Hallucination 05/07/2020   TSH (thyroid-stimulating hormone deficiency) 05/07/2020   Moderate dementia, with behavioral disturbance (HCC) 01/20/2017   Loss of memory 12/03/2016   Thoracic aortic atherosclerosis (HCC) 07/20/2016   Corneal edema of right eye 05/19/2016   Pseudophakia of right eye 04/09/2016   Status post cataract extraction and insertion of intraocular lens of left eye 04/09/2016   Primary open angle glaucoma of right eye, severe stage 08/10/2015   Status post eye surgery 08/10/2015   Major depression, recurrent (HCC) 07/13/2015   Hypertension, benign 07/13/2015   Hyperlipidemia 07/13/2015   Cataract 07/13/2015   Abnormal brain MRI 07/13/2015   Legally blind in right eye, as defined in [place] 07/13/2015   Dementia, vascular, with depression (HCC) 09/05/2014   Benign enlargement of prostate 03/22/2009    Conditions to be addressed/monitored: Dementia; Memory Deficits  Care Plan : Dementia (Adult)  Updates made by Wenda Overland, LCSW since 10/19/2020 12:00 AM     Problem: Caregiver Stress      Long-Range Goal: Caregiver Coping Optimized   Start Date: 06/29/2020  Expected End Date: 12/31/2020  This Visit's Progress: On track  Recent Progress: On track  Priority: Medium  Note:   Current Barriers:  Caregiver stress due to patients Dementia symptoms ADL IADL limitations Suicidal Ideation/Homicidal Ideation: No  Clinical Social Work Goal(s):  Over the next 90 days, patient's spouse will work with SW bi-weekly by telephone or in person to reduce or manage symptoms related to caregiver stress  Interventions:   Follow up phone call to patient's spouse to discuss status of patient's condition Confirmed  that patient's Neurologist has referred to patient for Hospice Care-intake appointment scheduled for 10/21/20 3 pm Patient's spouse confirmed plan for Hospice care at this time, and will no longer pursue out of home placement.  Family friend to continue to provide in home care while spouse works Spouse's feelings regarding decision for hospice care explored, emotional support/active listening reassurance provided Caregiver stress continues to be acknowledged-emphasized the need for self care-spouse continues to verbalize positive self management strategies    Patient Self Care Activities:  Strong family or social support  Patient Coping Strengths:  Risk manager  Patient Self Care Deficits:  Unable to perform ADLs independently Unable to perform IADLs independently        Follow Up Plan: SW will follow up with patient by phone over the next 30 days      Luva Metzger, LCSW Clinical Social Worker  [facility] Care Management [phone]

## 2020-10-22 NOTE — Patient Instructions (Signed)
  Patient Care Plan: Fall Risk (Adult)     Problem Identified: Fall Risk      Long-Range Goal: Absence of Fall and Fall-Related Injury   Start Date: 08/01/2020  Expected End Date: 10/30/2020  Priority: High  Note:    Current Barriers:  Risk For Falls in patient with HTN, HLD and Dementia Unable to perform ADLs independently Unable to perform IADLs independently   Clinical Goal(s):  Over the next 90 days, patient will not experience falls or require hospitalization/emergent care due to fall related injuries.   Interventions:  Collaboration with Alba Cory, MD regarding development and update of comprehensive plan of care as evidenced by provider attestation and co-signature Inter-disciplinary care team collaboration (see longitudinal plan of care) Reviewed safety and fall prevention measures and medication changes.  Discussed falls since last outreach. Patient's spouse Talbert Forest reports a fall since the last outreach. Reports Mr. Hinton wandered outside of the home and fell in the yard. Paramedics were contacted to assess him after the fall. Denies injuries or need for further evaluation. She reports increased episodes of disorientation throughout the day. Denies hallucinations. Reports he is still able to perform tasks and use an assistive device when prompted. His Seroquel was recently adjusted. She reports administering as prescribed.  We thoroughly discussed plan to ensure locks/devices are strategically placed to prevent Mr. Blum from wandering outside of the home. She reports using alarms since the incident. Reports devices will alarm when Mr. Mohabir gets out of bed or attempts to leave the home. Discussed long term plan. Spouse confirms that a friend is available to sit with Mr. Cullinane three days a week. Family is available on the other days. She is pending outreach with the Home Health team for therapy services. Also considering Hospice.  She will confirm with her daughter Drinda Butts.  Declines current need for a higher level of care. Agreed to update the care management team if his needs change. Agreed to have her daughter contact the team within the next week regarding plan for Hospice.    Patient Goals/Self-Care Activities Ensure pathways are clear and well lit Use appropriate assistive device when needed Ensure patient is wearing non-skid footwear to prevent accidental falls and injuries Ensure safety alarms are activated Continue to provide assistance with ADL'S and IADL'S as needed Update the care management team if additional assistance is needed   Follow Up Plan:  Will follow up within the next month         Mr. Baskerville' spouse/caregiver Talbert Forest verbalized understanding of the information discussed during the telephonic outreach. Declined need for mailed/printed instructions. A member of the care management team will follow up within the next month.    [facility] [phone]

## 2020-10-26 ENCOUNTER — Telehealth: Payer: Self-pay | Admitting: Family Medicine

## 2020-10-26 NOTE — Telephone Encounter (Signed)
completed

## 2020-10-26 NOTE — Telephone Encounter (Signed)
Kim from [facility] hosp[facility] called in regards to getting info on patients diagnoses that they need. Please call back

## 2020-10-27 DIAGNOSIS — Z7409 Other reduced mobility: Secondary | ICD-10-CM | POA: Diagnosis not present

## 2020-10-27 DIAGNOSIS — R69 Illness, unspecified: Secondary | ICD-10-CM | POA: Diagnosis not present

## 2020-10-27 DIAGNOSIS — Z841 Family history of disorders of kidney and ureter: Secondary | ICD-10-CM | POA: Diagnosis not present

## 2020-10-27 DIAGNOSIS — Z591 Inadequate housing: Secondary | ICD-10-CM | POA: Diagnosis not present

## 2020-10-27 DIAGNOSIS — H547 Unspecified visual loss: Secondary | ICD-10-CM | POA: Diagnosis not present

## 2020-10-27 DIAGNOSIS — Z833 Family history of diabetes mellitus: Secondary | ICD-10-CM | POA: Diagnosis not present

## 2020-10-27 DIAGNOSIS — R32 Unspecified urinary incontinence: Secondary | ICD-10-CM | POA: Diagnosis not present

## 2020-10-27 DIAGNOSIS — G309 Alzheimer's disease, unspecified: Secondary | ICD-10-CM | POA: Diagnosis not present

## 2020-11-02 ENCOUNTER — Ambulatory Visit (INDEPENDENT_AMBULATORY_CARE_PROVIDER_SITE_OTHER): Payer: Self-pay

## 2020-11-02 DIAGNOSIS — F0391 Unspecified dementia with behavioral disturbance: Secondary | ICD-10-CM

## 2020-11-02 NOTE — Chronic Care Management (AMB) (Signed)
  Chronic Care Management   CCM RN Visit Note  11/02/2020 Name: Sean Phelps MRN: [id number] DOB: 1940/10/22  Subjective: Sean Phelps is a 80 y.o. year old male who is a primary care patient of Alba Cory, MD. The care management team was consulted for assistance with disease management and care coordination needs.    Care Coordination regarding pending referral.  [name]' spouse was unsure if his pending referral was for Hospice or Palliative Care. Also unsure is he would qualify for services. Attempted to verify today. Will follow up with the family next week.  Consent to Services:  The patient was given information about Chronic Care Management services, agreed to services, and gave verbal consent prior to initiation of services.  Please see initial visit note for detailed documentation.    Assessment: Review of patient past medical history, allergies, medications, health status, including review of consultants reports, laboratory and other test data, was performed as part of comprehensive evaluation and provision of chronic care management services.   SDOH (Social Determinants of Health) assessments and interventions performed:  No  CCM Care Plan  Allergies  Allergen Reactions   Brinzolamide-Brimonidine Other (See Comments)    Red and itchy    Outpatient Encounter Medications as of 11/02/2020  Medication Sig Note   carbidopa-levodopa (SINEMET IR) 25-100 MG tablet Take 1 tablet by mouth 2 (two) times daily. (Patient not taking: Reported on 10/17/2020) 10/17/2020: Reports being instructed to discontinue   Cyanocobalamin (VITAMIN B-12) 1000 MCG SUBL Place 1 each under the tongue daily.    QUEtiapine (SEROQUEL) 25 MG tablet Take 25 mg by mouth at bedtime.    No facility-administered encounter medications on file as of 11/02/2020.    Patient Active Problem List   Diagnosis Date Noted   B12 deficiency 05/07/2020   Delusions (HCC) 05/07/2020   Hallucination 05/07/2020    TSH (thyroid-stimulating hormone deficiency) 05/07/2020   Moderate dementia, with behavioral disturbance (HCC) 01/20/2017   Loss of memory 12/03/2016   Thoracic aortic atherosclerosis (HCC) 07/20/2016   Corneal edema of right eye 05/19/2016   Pseudophakia of right eye 04/09/2016   Status post cataract extraction and insertion of intraocular lens of left eye 04/09/2016   Primary open angle glaucoma of right eye, severe stage 08/10/2015   Status post eye surgery 08/10/2015   Major depression, recurrent (HCC) 07/13/2015   Hypertension, benign 07/13/2015   Hyperlipidemia 07/13/2015   Cataract 07/13/2015   Abnormal brain MRI 07/13/2015   Legally blind in right eye, as defined in [place] 07/13/2015   Dementia, vascular, with depression (HCC) 09/05/2014   Benign enlargement of prostate 03/22/2009      PLAN: A member of the care management team will follow up next week.   [facility] Care [facility] [phone]

## 2020-11-23 ENCOUNTER — Ambulatory Visit: Payer: Medicare HMO | Admitting: *Deleted

## 2020-11-23 DIAGNOSIS — F0153 Vascular dementia, unspecified severity, with mood disturbance: Secondary | ICD-10-CM

## 2020-11-23 DIAGNOSIS — F329 Major depressive disorder, single episode, unspecified: Secondary | ICD-10-CM

## 2020-11-23 DIAGNOSIS — I10 Essential (primary) hypertension: Secondary | ICD-10-CM

## 2020-11-23 DIAGNOSIS — F0150 Vascular dementia without behavioral disturbance: Secondary | ICD-10-CM

## 2020-11-23 DIAGNOSIS — F0391 Unspecified dementia with behavioral disturbance: Secondary | ICD-10-CM

## 2020-11-23 DIAGNOSIS — Z9181 History of falling: Secondary | ICD-10-CM

## 2020-11-23 DIAGNOSIS — R69 Illness, unspecified: Secondary | ICD-10-CM | POA: Diagnosis not present

## 2020-11-23 NOTE — Chronic Care Management (AMB) (Signed)
Chronic Care Management    Clinical Social Work Note  11/23/2020 Name: Sean Phelps MRN: [id number] DOB: 1940/09/18  Sean Phelps is a 80 y.o. year old male who is a primary care patient of Alba Cory, MD. The CCM team was consulted to assist the patient with chronic disease management and/or care coordination needs related to: [employer] .   Collaboration with patient's spouse and daughter  for  follow up regarding   in response to provider referral for social work chronic care management and care coordination services.   Consent to Services:  The patient was given information about Chronic Care Management services, agreed to services, and gave verbal consent prior to initiation of services.  Please see initial visit note for detailed documentation.   Patient agreed to services and consent obtained.   Assessment: Review of patient past medical history, allergies, medications, and health status, including review of relevant consultants reports was performed today as part of a comprehensive evaluation and provision of chronic care management and care coordination services.     SDOH (Social Determinants of Health) assessments and interventions performed:    Advanced Directives Status: Not addressed in this encounter.  CCM Care Plan  Allergies  Allergen Reactions   Brinzolamide-Brimonidine Other (See Comments)    Red and itchy    Outpatient Encounter Medications as of 11/23/2020  Medication Sig Note   carbidopa-levodopa (SINEMET IR) 25-100 MG tablet Take 1 tablet by mouth 2 (two) times daily. (Patient not taking: Reported on 10/17/2020) 10/17/2020: Reports being instructed to discontinue   Cyanocobalamin (VITAMIN B-12) 1000 MCG SUBL Place 1 each under the tongue daily.    QUEtiapine (SEROQUEL) 25 MG tablet Take 25 mg by mouth at bedtime.    No facility-administered encounter medications on file as of 11/23/2020.    Patient Active Problem List   Diagnosis Date  Noted   B12 deficiency 05/07/2020   Delusions (HCC) 05/07/2020   Hallucination 05/07/2020   TSH (thyroid-stimulating hormone deficiency) 05/07/2020   Moderate dementia, with behavioral disturbance (HCC) 01/20/2017   Loss of memory 12/03/2016   Thoracic aortic atherosclerosis (HCC) 07/20/2016   Corneal edema of right eye 05/19/2016   Pseudophakia of right eye 04/09/2016   Status post cataract extraction and insertion of intraocular lens of left eye 04/09/2016   Primary open angle glaucoma of right eye, severe stage 08/10/2015   Status post eye surgery 08/10/2015   Major depression, recurrent (HCC) 07/13/2015   Hypertension, benign 07/13/2015   Hyperlipidemia 07/13/2015   Cataract 07/13/2015   Abnormal brain MRI 07/13/2015   Legally blind in right eye, as defined in [place] 07/13/2015   Dementia, vascular, with depression (HCC) 09/05/2014   Benign enlargement of prostate 03/22/2009    Conditions to be addressed/monitored: Dementia; Memory Deficits  Care Plan : Dementia (Adult)  Updates made by Wenda Overland, LCSW since 11/23/2020 12:00 AM     Problem: Caregiver Stress      Long-Range Goal: Caregiver Coping Optimized   Start Date: 06/29/2020  Expected End Date: 12/31/2020  This Visit's Progress: On track  Recent Progress: On track  Priority: Medium  Note:   Current Barriers:  Caregiver stress due to patients Dementia symptoms ADL IADL limitations Suicidal Ideation/Homicidal Ideation: No  Clinical Social Work Goal(s):  Over the next 90 days, patient's spouse will work with SW bi-weekly by telephone or in person to reduce or manage symptoms related to caregiver stress  Interventions:   Follow up phone call to patient's spouse to  discuss status of patient's condition and to confirm hospice referral Confirmed  with patient's spouse and daughters that Neurologist has referred patient  for Hospice Care-patient now followed by Hospice Patient's spouse continues to confirm plan for  Hospice care at this time, and will no longer pursue out of home placement. Family friend to continue to provide in home care while spouse works Spouse's feelings regarding decision for hospice care explored, emotional support/ reassurance provided Caregiver stress continues to be acknowledged-emphasized the need for self care-spouse continues to verbalize positive self management strategies Collaboration phone call to [facility] to discuss patient's Hospice involvement    Patient Self Care Activities:  Strong family or social support  Patient Coping Strengths:  Risk manager  Patient Self Care Deficits:  Unable to perform ADLs independently Unable to perform IADLs independently        Follow Up Plan:  Patient to continue his involvement with Hospice Care-patient's spouse to call this Child psychotherapist with any additional community resource needs      [employer], LCSW Clinical Social Worker  [facility] Care Management [phone]

## 2020-11-23 NOTE — Patient Instructions (Signed)
Visit Information   Goals Addressed             This Visit's Progress    Care for the Caregiver-Dementia       Timeframe:  Long-Range Goal Priority:  High Start Date:    06/29/20                         Expected End Date:  11/23/20                 Follow Up Date 11/23/20   - do one enjoyable thing every day - eat healthy - exercise at least 2 to 3 times per week - get at least 7 to 8 hours of sleep at night - develop a new routine to improve sleep  -continued medication adherence and follow up with Neurologist -caregiver-continue self management strategies: accepting  assistance for patient care, continued emphasis on self care -Continue follow up with Hospice Care   Why is this important?   People who care for a person with dementia often feel stress.  Too much stress can be harmful to both of you.  You may feel depressed, exhausted, have trouble sleeping or have health problems.  You need to take care of yourself and reach out for help when you need it.  There are some things that can be done to keep the quality of your life satisfying.     Notes:         The patient verbalized understanding of instructions, educational materials, and care plan provided today and declined offer to receive copy of patient instructions, educational materials, and care plan.   No further follow up required: patient to continue with Hospice care, patient's spouse to contact this Child psychotherapist with any additional community resource needs  [employer], LCSW Clinical Social Worker  [facility] Care Management [phone]

## 2020-11-29 ENCOUNTER — Ambulatory Visit (INDEPENDENT_AMBULATORY_CARE_PROVIDER_SITE_OTHER)

## 2020-11-29 DIAGNOSIS — Z9181 History of falling: Secondary | ICD-10-CM

## 2020-11-29 DIAGNOSIS — F0153 Vascular dementia, unspecified severity, with mood disturbance: Secondary | ICD-10-CM

## 2020-11-29 NOTE — Chronic Care Management (AMB) (Signed)
Chronic Care Management   CCM RN Visit Note  11/29/2020 Name: Sean Phelps MRN: [id number] DOB: 03-29-1940  Subjective: Sean Phelps is a 80 y.o. year old male who is a primary care patient of Sean Cory, MD. The care management team was consulted for assistance with disease management and care coordination needs.    Engaged with patient's daughter Sean Phelps by telephone for follow up visit in response to provider referral for case management and care coordination services.   Consent to Services:  The patient was given information about Chronic Care Management services, agreed to services, and gave verbal consent prior to initiation of services.  Please see initial visit note for detailed documentation.     Assessment: Review of patient past medical history, allergies, medications, health status, including review of consultants reports, laboratory and other test data, was performed as part of comprehensive evaluation and provision of chronic care management services.   SDOH (Social Determinants of Health) assessments and interventions performed:  No  CCM Care Plan  Allergies  Allergen Reactions   Brinzolamide-Brimonidine Other (See Comments)    Red and itchy    Outpatient Encounter Medications as of 11/29/2020  Medication Sig Note   carbidopa-levodopa (SINEMET IR) 25-100 MG tablet Take 1 tablet by mouth 2 (two) times daily. (Patient not taking: Reported on 10/17/2020) 10/17/2020: Reports being instructed to discontinue   Cyanocobalamin (VITAMIN B-12) 1000 MCG SUBL Place 1 each under the tongue daily.    QUEtiapine (SEROQUEL) 25 MG tablet Take 25 mg by mouth at bedtime.    No facility-administered encounter medications on file as of 11/29/2020.    Patient Active Problem List   Diagnosis Date Noted   B12 deficiency 05/07/2020   Delusions (HCC) 05/07/2020   Hallucination 05/07/2020   TSH (thyroid-stimulating hormone deficiency) 05/07/2020   Moderate dementia, with  behavioral disturbance 01/20/2017   Loss of memory 12/03/2016   Thoracic aortic atherosclerosis (HCC) 07/20/2016   Corneal edema of right eye 05/19/2016   Pseudophakia of right eye 04/09/2016   Status post cataract extraction and insertion of intraocular lens of left eye 04/09/2016   Primary open angle glaucoma of right eye, severe stage 08/10/2015   Status post eye surgery 08/10/2015   Major depression, recurrent (HCC) 07/13/2015   Hypertension, benign 07/13/2015   Hyperlipidemia 07/13/2015   Cataract 07/13/2015   Abnormal brain MRI 07/13/2015   Legally blind in right eye, as defined in [place] 07/13/2015   Dementia, vascular, with depression 09/05/2014   Benign enlargement of prostate 03/22/2009    Conditions to be addressed/monitored:Dementia and transition to Hospice  Long-Range Goal: Absence of Fall and Fall-Related Injury Completed 11/29/2020  Start Date: 08/01/2020  Expected End Date: 10/30/2020  Priority: High  Note:    Current Barriers:  Risk For Falls in patient with HTN, HLD and Dementia Unable to perform ADLs independently Unable to perform IADLs independently   Clinical Goal(s):  Over the next 90 days, patient will not experience falls or require hospitalization/emergent care due to fall related injuries.   Interventions:  Collaboration with Sean Cory, MD regarding development and update of comprehensive plan of care as evidenced by provider attestation and co-signature Inter-disciplinary care team collaboration (see longitudinal plan of care) Discussed long term plan with Mr. Sean Phelps' daughter Sean Phelps. Reports Mr. Sean Phelps has transitioned to [facility]. Reports he is currently receiving visits twice a week. No further care management outreach require. Agreed to contact the care management with questions if needed.     Patient Goals/Self-Care  Activities Ensure pathways are clear and well lit Use appropriate assistive device when needed Ensure patient is  wearing non-skid footwear to prevent accidental falls and injuries Ensure safety alarms are activated Continue to provide assistance with ADL'S and IADL'S as needed Update the care management team if additional assistance is needed   Transitioned to Hospice       PLAN Sean Phelps has transitioned to [facility]. His family will contact the clinic with questions or concerns if needed.   [facility] Care Management [facility] [phone]

## 2020-11-29 NOTE — Patient Instructions (Addendum)
Thank you for allowing the Chronic Care Management team to participate in your care.    Long-Range Goal: Absence of Fall and Fall-Related Injury Completed 11/29/2020  Start Date: 08/01/2020  Expected End Date: 10/30/2020  Priority: High  Note:    Current Barriers:  Risk For Falls in patient with HTN, HLD and Dementia Unable to perform ADLs independently Unable to perform IADLs independently   Clinical Goal(s):  Over the next 90 days, patient will not experience falls or require hospitalization/emergent care due to fall related injuries.   Interventions:  Collaboration with Alba Cory, MD regarding development and update of comprehensive plan of care as evidenced by provider attestation and co-signature Inter-disciplinary care team collaboration (see longitudinal plan of care) Discussed long term plan with Mr. Ferns' daughter Jola Babinski. Reports Mr. Hentz has transitioned to [facility] Reports he is currently receiving visits twice a week. No further care management outreach require. Agreed to contact the care management with questions if needed.     Patient Goals/Self-Care Activities Ensure pathways are clear and well lit Use appropriate assistive device when needed Ensure patient is wearing non-skid footwear to prevent accidental falls and injuries Ensure safety alarms are activated Continue to provide assistance with ADL'S and IADL'S as needed Update the care management team if additional assistance is needed   Transitioned to Hospice      Mr. Morain has transitioned to [facility]. His family will contact the clinic with questions or concerns if needed.   [facility] [phone]

## 2020-12-03 ENCOUNTER — Telehealth: Payer: Self-pay

## 2020-12-03 NOTE — Progress Notes (Signed)
    Chronic Care Management Pharmacy Assistant   Name: Sean Phelps  MRN: [id number] DOB: 1940/11/23  Reason for Encounter:Hypertension Disease State Call.   Recent office visits:  11/29/2020 Juanell Fairly RN (CCM) 11/23/2020  Chrystal Land LCSW (CCM) 11/02/2020 Juanell Fairly RN (CCM) 10/19/2020 Chrystal Land LCSW (CCM) 10/17/2020 Juanell Fairly RN (CCM) 10/05/2020 Chrystal Land LCSW (CCM) 09/25/2020 Dr.Sowles MD (PCP) Stop aspirin due to recurrent falls, stop Losartan   Recent consult visits:  10/15/2020 Nilda Calamity PA (Neurology) Increase Seroquel to 1.5 tablets nightly (37.5mg ),Decrease sinemet to 1 tablet once daily for two weeks, then discontinue, Referral for hospice care, Follow up 2-3 months  Hospital visits:  None in previous 6 months  Medications: Outpatient Encounter Medications as of 12/03/2020  Medication Sig Note   carbidopa-levodopa (SINEMET IR) 25-100 MG tablet Take 1 tablet by mouth 2 (two) times daily. (Patient not taking: Reported on 10/17/2020) 10/17/2020: Reports being instructed to discontinue   Cyanocobalamin (VITAMIN B-12) 1000 MCG SUBL Place 1 each under the tongue daily.    QUEtiapine (SEROQUEL) 25 MG tablet Take 25 mg by mouth at bedtime.    No facility-administered encounter medications on file as of 12/03/2020.    Care Gaps: Shingrix Vaccine COVID-19 Vaccine (3- Booster for Moderna Series) Influenza Vaccine Star Rating Drugs: None ID Medication Fill Gaps: None ID  Reviewed chart prior to disease state call. Spoke with patient regarding BP  Recent Office Vitals: BP Readings from Last 3 Encounters:  09/25/20 110/60  06/25/20 110/62  05/28/20 116/62   Pulse Readings from Last 3 Encounters:  09/25/20 67  06/25/20 82  05/28/20 62    Wt Readings from Last 3 Encounters:  09/25/20 102 lb (46.3 kg)  06/25/20 102 lb (46.3 kg)  05/28/20 104 lb 3.2 oz (47.3 kg)     Kidney Function Lab Results  Component Value Date/Time   CREATININE  1.01 05/15/2020 08:59 PM   CREATININE 0.75 06/02/2019 10:00 AM   CREATININE 0.84 12/01/2018 12:00 AM   GFRNONAA >60 05/15/2020 08:59 PM   GFRNONAA 86 06/02/2019 10:00 AM   GFRAA 100 06/02/2019 10:00 AM    BMP Latest Ref Rng & Units 05/15/2020 06/02/2019 12/01/2018  Glucose 70 - 99 mg/dL 99 86 90  BUN 8 - 23 mg/dL 18 17 14   Creatinine 0.61 - 1.24 mg/dL 4.09 8.11  BUN/Creat Ratio 6 - 22 (calc) - NOT APPLICABLE NOT APPLICABLE  Sodium 135 - 145 mmol/L 138 140 140  Potassium 3.5 - 5.1 mmol/L 4.2 3.8 4.4  Chloride 98 - 111 mmol/L 102 105 104  CO2 22 - 32 mmol/L 28 30 29   Calcium 8.9 - 10.3 mg/dL 9.0 9.3 9.6     Patient is now currently receiving care through Hospice per patient chart note on 11/29/2020.Notified Clinical Pharmacist.    Clinical Pharmacist Assistant [phone]

## 2020-12-05 ENCOUNTER — Other Ambulatory Visit: Payer: Self-pay

## 2020-12-05 ENCOUNTER — Emergency Department
Admission: EM | Admit: 2020-12-05 | Discharge: 2020-12-05 | Disposition: A | Discharge status: Home / Self Care | Attending: Emergency Medicine | Admitting: Emergency Medicine

## 2020-12-05 ENCOUNTER — Emergency Department

## 2020-12-05 DIAGNOSIS — F0390 Unspecified dementia without behavioral disturbance: Secondary | ICD-10-CM | POA: Diagnosis not present

## 2020-12-05 DIAGNOSIS — Z743 Need for continuous supervision: Secondary | ICD-10-CM | POA: Diagnosis not present

## 2020-12-05 DIAGNOSIS — W19XXXA Unspecified fall, initial encounter: Secondary | ICD-10-CM | POA: Diagnosis not present

## 2020-12-05 DIAGNOSIS — M545 Low back pain, unspecified: Secondary | ICD-10-CM | POA: Insufficient documentation

## 2020-12-05 DIAGNOSIS — Z9889 Other specified postprocedural states: Secondary | ICD-10-CM | POA: Diagnosis not present

## 2020-12-05 DIAGNOSIS — R52 Pain, unspecified: Secondary | ICD-10-CM | POA: Diagnosis not present

## 2020-12-05 DIAGNOSIS — M549 Dorsalgia, unspecified: Secondary | ICD-10-CM | POA: Diagnosis not present

## 2020-12-05 DIAGNOSIS — R69 Illness, unspecified: Secondary | ICD-10-CM | POA: Diagnosis not present

## 2020-12-05 DIAGNOSIS — Z043 Encounter for examination and observation following other accident: Secondary | ICD-10-CM | POA: Diagnosis not present

## 2020-12-05 DIAGNOSIS — J45909 Unspecified asthma, uncomplicated: Secondary | ICD-10-CM | POA: Insufficient documentation

## 2020-12-05 DIAGNOSIS — I10 Essential (primary) hypertension: Secondary | ICD-10-CM | POA: Insufficient documentation

## 2020-12-05 DIAGNOSIS — Z87891 Personal history of nicotine dependence: Secondary | ICD-10-CM | POA: Insufficient documentation

## 2020-12-05 DIAGNOSIS — M546 Pain in thoracic spine: Secondary | ICD-10-CM | POA: Diagnosis not present

## 2020-12-05 LAB — URINALYSIS, COMPLETE (UACMP) WITH MICROSCOPIC
Bilirubin Urine: NEGATIVE
Glucose, UA: NEGATIVE mg/dL
Hgb urine dipstick: NEGATIVE
Ketones, ur: NEGATIVE mg/dL
Leukocytes,Ua: NEGATIVE
Nitrite: NEGATIVE
Protein, ur: 30 mg/dL — AB
Specific Gravity, Urine: 1.015 (ref 1.005–1.030)
pH: 5 (ref 5.0–8.0)

## 2020-12-05 LAB — BASIC METABOLIC PANEL
Anion gap: 10 (ref 5–15)
BUN: 18 mg/dL (ref 8–23)
CO2: 28 mmol/L (ref 22–32)
Calcium: 9.7 mg/dL (ref 8.9–10.3)
Chloride: 99 mmol/L (ref 98–111)
Creatinine, Ser: 0.84 mg/dL (ref 0.61–1.24)
GFR, Estimated: >60 mL/min (ref >60)
Glucose, Bld: 87 mg/dL (ref 70–99)
Potassium: 4.1 mmol/L (ref 3.5–5.1)
Sodium: 137 mmol/L (ref 135–145)

## 2020-12-05 LAB — CBC WITH DIFFERENTIAL/PLATELET
Abs Immature Granulocytes: 0.02 10*3/uL (ref 0.00–0.07)
Basophils Absolute: 0 10*3/uL (ref 0.0–0.1)
Basophils Relative: 1 %
Eosinophils Absolute: 0.1 10*3/uL (ref 0.0–0.5)
Eosinophils Relative: 1 %
HCT: 38.1 % — ABNORMAL LOW (ref 39.0–52.0)
Hemoglobin: 12.7 g/dL — ABNORMAL LOW (ref 13.0–17.0)
Immature Granulocytes: 1 %
Lymphocytes Relative: 30 %
Lymphs Abs: 1.1 10*3/uL (ref 0.7–4.0)
MCH: 29.7 pg (ref 26.0–34.0)
MCHC: 33.3 g/dL (ref 30.0–36.0)
MCV: 89.2 fL (ref 80.0–100.0)
Monocytes Absolute: 0.3 10*3/uL (ref 0.1–1.0)
Monocytes Relative: 8 %
Neutro Abs: 2.2 10*3/uL (ref 1.7–7.7)
Neutrophils Relative %: 59 %
Platelets: 177 10*3/uL (ref 150–400)
RBC: 4.27 MIL/uL (ref 4.22–5.81)
RDW: 12.4 % (ref 11.5–15.5)
WBC: 3.7 10*3/uL — ABNORMAL LOW (ref 4.0–10.5)
nRBC: 0 % (ref 0.0–0.2)

## 2020-12-05 LAB — TROPONIN I (HIGH SENSITIVITY): Troponin I (High Sensitivity): 7 ng/L (ref <18)

## 2020-12-05 NOTE — ED Notes (Signed)
Pt transported to CT and radiology at this time

## 2020-12-05 NOTE — ED Provider Notes (Signed)
[facility] Emergency Department Provider Note ____________________________________________   Event Date/Time   First MD Initiated Contact with Patient 12/05/20 0745     (approximate)  I have reviewed the triage vital signs and the nursing notes.   HISTORY  Chief Complaint Fall  HPI Sean Phelps is a 80 y.o. male with history of dementia and remaining history as listed below presents to the emergency department for treatment and evaluation after unwitnessed fall. He lives at home with his wife who heard the bed alarm, but was unable to get to him in time before he fell in the bathroom. EMS report he was able to assist with standing but complains of lower back pain. Family report he is at his cognitive baseline.          Past Medical History:  Diagnosis Date   Asthma    Brachial neuritis    Brachial neuritis    Elevated cholesterol 07/20/14   Hypertension    Lumbago    Thoracic aortic atherosclerosis (HCC) 07/20/2016   Chest xray May 2018    Patient Active Problem List   Diagnosis Date Noted   B12 deficiency 05/07/2020   Delusions (HCC) 05/07/2020   Hallucination 05/07/2020   TSH (thyroid-stimulating hormone deficiency) 05/07/2020   Moderate dementia, with behavioral disturbance 01/20/2017   Loss of memory 12/03/2016   Thoracic aortic atherosclerosis (HCC) 07/20/2016   Corneal edema of right eye 05/19/2016   Pseudophakia of right eye 04/09/2016   Status post cataract extraction and insertion of intraocular lens of left eye 04/09/2016   Primary open angle glaucoma of right eye, severe stage 08/10/2015   Status post eye surgery 08/10/2015   Major depression, recurrent (HCC) 07/13/2015   Hypertension, benign 07/13/2015   Hyperlipidemia 07/13/2015   Cataract 07/13/2015   Abnormal brain MRI 07/13/2015   Legally blind in right eye, as defined in [place] 07/13/2015   Dementia, vascular, with depression 09/05/2014   Benign enlargement of prostate  03/22/2009    Past Surgical History:  Procedure Laterality Date   CATARACT EXTRACTION Right    EYE SURGERY      Prior to Admission medications   Medication Sig Start Date End Date Taking? Authorizing Provider  carbidopa-levodopa (SINEMET IR) 25-100 MG tablet Take 1 tablet by mouth 2 (two) times daily. Patient not taking: Reported on 10/17/2020 08/30/20   Morene Crocker, MD  Cyanocobalamin (VITAMIN B-12) 1000 MCG SUBL Place 1 each under the tongue daily.    [provider]  QUEtiapine (SEROQUEL) 25 MG tablet Take 25 mg by mouth at bedtime. 08/30/20   [provider]    Allergies Brinzolamide-brimonidine  Family History  Problem Relation Age of Onset   Leukemia Father    Emphysema Father    Alcohol abuse Mother     Social History Social History   Tobacco Use   Smoking status: Former    Years: 25.00    Types: Cigarettes    Quit date: 12/25/1965    Years since quitting: 54.9   Smokeless tobacco: Never  Vaping Use   Vaping Use: Never used  Substance Use Topics   Alcohol use: No    Alcohol/week: 0.0 standard drinks   Drug use: No    Review of Systems  Constitutional: No fever/chills Eyes: No visual changes. ENT: No sore throat. Cardiovascular: Denies chest pain. Respiratory: Denies shortness of breath. Gastrointestinal: No abdominal pain.  No nausea, no vomiting.  No diarrhea.  No constipation. Genitourinary: Negative for dysuria. Musculoskeletal:  Positive for back pain. Skin: Negative for open wounds. Neurological: Negative for headaches, focal weakness or numbness. ____________________________________________   PHYSICAL EXAM:  VITAL SIGNS: ED Triage Vitals  Enc Vitals Group     BP 12/05/20 0734 (!) 166/93     Pulse Rate 12/05/20 0734 82     Resp 12/05/20 0734 18     Temp --      Temp src --      SpO2 12/05/20 0734 92 %     Weight 12/05/20 0736 100 lb (45.4 kg)     Height 12/05/20 0736 5\' 1"  (1.549 m)     Head Circumference --       Peak Flow --      Pain Score --      Pain Loc --      Pain Edu? --      Excl. in GC? --     Constitutional: Alert. No acute distress. Head: Atraumatic. Nose: No congestion/rhinnorhea. Mouth/Throat: Mucous membranes are moist.  Oropharynx non-erythematous. No trauma. Neck: No stridor.  No midline tenderness. Hematological/Lymphatic/Immunilogical: No cervical lymphadenopathy. Cardiovascular: Normal rate, regular rhythm. Grossly normal heart sounds.  Good peripheral circulation. Respiratory: Normal respiratory effort.  No retractions. Lungs CTAB. Gastrointestinal: Soft and nontender. No distention.  Musculoskeletal: No hip or knee pain with flexion, extension, internal, and external rotation. No pain in upper extremities. Midline tenderness over lower thoracic and lumbar spine.  Neurologic:  Normal speech and language. No gross focal neurologic deficits are appreciated. No gait instability. Skin:  Skin is warm, dry and intact. No rash noted. Psychiatric: Mood and affect are normal. Speech and behavior are normal.  ____________________________________________   LABS (all labs ordered are listed, but only abnormal results are displayed)  Labs Reviewed  CBC WITH DIFFERENTIAL/PLATELET - Abnormal; Notable for the following components:      Result Value   WBC 3.7 (*)    Hemoglobin 12.7 (*)    HCT 38.1 (*)    All other components within normal limits  URINALYSIS, COMPLETE (UACMP) WITH MICROSCOPIC - Abnormal; Notable for the following components:   Color, Urine YELLOW (*)    APPearance CLEAR (*)    Protein, ur 30 (*)    Bacteria, UA MANY (*)    All other components within normal limits  BASIC METABOLIC PANEL  TROPONIN I (HIGH SENSITIVITY)  TROPONIN I (HIGH SENSITIVITY)   ____________________________________________  EKG  ED ECG REPORT I, Maverick Dieudonne, FNP-BC personally viewed and interpreted this ECG.   Date: 12/05/2020  EKG Time: 0840  Rate: 60  Rhythm: normal sinus  rhythm, unchanged from previous tracings, LV hypertrophy  Axis: normal  Intervals:none  ST&T Change: no ST changes.  ____________________________________________  RADIOLOGY  ED MD interpretation:    CT head, cervical spine, thoracic spine all negative for acute findings.  X-rays of the lumbar spine negative for acute concerns.  I, 02/04/2021, personally viewed and evaluated these images (plain radiographs) as part of my medical decision making, as well as reviewing the written report by the radiologist.  Official radiology report(s): DG Thoracic Spine 2 View  Result Date: 12/05/2020 CLINICAL DATA:  Unwitnessed fall, back pain EXAM: THORACIC SPINE 2 VIEWS COMPARISON:  None. FINDINGS: The upper thoracic vertebral bodies are suboptimally assessed due to overlying structures. There is mild loss of vertebral body height of a lower thoracic vertebral body. The remaining imaged vertebral body heights appear preserved. Alignment is normal. There is mild multilevel degenerative change throughout the thoracic spine. The imaged heart  and lungs are unremarkable. IMPRESSION: Mild loss of vertebral body height of a lower thoracic vertebral body, age indeterminate. Correlate with point tenderness; cross-sectional imaging can be obtained as indicated. Electronically Signed   By: Lesia Hausen M.D.   On: 12/05/2020 08:36   DG Lumbar Spine 2-3 Views  Result Date: 12/05/2020 CLINICAL DATA:  Status post fall.  Pain. EXAM: LUMBAR SPINE - 2-3 VIEW COMPARISON:  CT AP 05/16/2020 FINDINGS: There is a large stool burden noted within the colon. Stool and bowel gas overlie the lumbar spine diminishing exam detail. Mild curvature of the lumbar spine is convex towards the left. There is an anterolisthesis L4 on L5 measuring 8 mm. No fracture identified. The disc spaces are relatively well preserved. IMPRESSION: 1. Acute abnormality. 2. 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 Electronically Signed   By:  Signa Kell M.D.   On: 12/05/2020 08:33   CT Head Wo Contrast  Result Date: 12/05/2020 CLINICAL DATA:  Un witnessed fall EXAM: CT HEAD WITHOUT CONTRAST TECHNIQUE: Contiguous axial images were obtained from the base of the skull through the vertex without intravenous contrast. COMPARISON:  Brain MRI 11/15/2013, CT head 07/20/2016 FINDINGS: Brain: There is no acute intracranial hemorrhage, extra-axial fluid collection, or acute infarct. There is mild parenchymal volume loss. The ventricles are normal in size. Foci of hypodensity in the subcortical and periventricular white matter likely reflects sequela of mild chronic white matter microangiopathy. There is no mass lesion.  There is no midline shift. Vascular: There is calcification of the bilateral cavernous ICAs. Skull: Normal. Negative for fracture or focal lesion. Sinuses/Orbits: The imaged paranasal sinuses are clear. Postsurgical changes of the globes likely reflect glaucoma valves. Bilateral lens implants are in place. Other: None. IMPRESSION: No acute intracranial hemorrhage or calvarial fracture. Electronically Signed   By: Lesia Hausen M.D.   On: 12/05/2020 08:40   CT Cervical Spine Wo Contrast  Result Date: 12/05/2020 CLINICAL DATA:  Un witnessed fall EXAM: CT CERVICAL SPINE WITHOUT CONTRAST TECHNIQUE: Multidetector CT imaging of the cervical spine was performed without intravenous contrast. Multiplanar CT image reconstructions were also generated. COMPARISON:  None. FINDINGS: Alignment: There is straightening of the normal cervical spine lordosis. There is trace anterolisthesis of C5 on C6, likely degenerative in nature. There is no jumped or perched facet or other evidence of traumatic malalignment. Skull base and vertebrae: Skull base alignment is maintained. There is mild anterior wedge deformity of the T1 vertebral body with approximately 10-20% loss of vertebral body height anteriorly. There is no bony retropulsion. Vertebral body heights  are otherwise preserved. There is no evidence of acute fracture. Soft tissues and spinal canal: No prevertebral fluid or swelling. No visible canal hematoma. Disc levels: There is marked intervertebral disc space narrowing with associated uncovertebral arthropathy at C5-C6 and C6-C7. The osseous spinal canal and neural foramina appear patent. There are more mild degenerative changes at the remaining levels. Upper chest: The lung apices are clear. Other: None. IMPRESSION: 1. Mild anterior wedge deformity of the T1 vertebral body which appears chronic in nature. 2. Otherwise, no evidence of acute fracture or traumatic malalignment in the cervical spine. 3. Degenerative changes most advanced at C5-C6 and C6-C7. Electronically Signed   By: Lesia Hausen M.D.   On: 12/05/2020 08:45   CT Thoracic Spine Wo Contrast  Result Date: 12/05/2020 CLINICAL DATA:  Back pain following unwitnessed fall. History of dementia. EXAM: CT THORACIC SPINE WITHOUT CONTRAST TECHNIQUE: Multidetector CT images of the thoracic  were obtained using the standard protocol without intravenous contrast. COMPARISON:  Radiographs of the thoracolumbar spine same date. Abdominal CT 05/16/2020. FINDINGS: Alignment: Normal. Vertebrae: Mild endplate degenerative changes throughout the thoracic spine. Difficult to exclude a minimal acute superior endplate compression deformity at T10. No other evidence of acute fracture. The posterior elements appear intact. Paraspinal and other soft tissues: No paraspinal hematoma or significant soft tissue swelling. The visualized lungs appear clear aside from minimal bibasilar atelectasis. There is atherosclerosis of the aorta, great vessels and coronary arteries. Disc levels: Mild degenerative changes throughout the thoracic spine with scattered endplate osteophytes. No large disc herniation or significant spinal stenosis identified. Cervical spine findings dictated separately. IMPRESSION: 1. No definitive acute  osseous findings. Difficult to exclude a minimal acute fracture involving the superior endplate of T10. 2. Mild multilevel spondylosis with endplate osteophytes. No large disc herniation or significant spinal stenosis. 3. Normal alignment. 4.  Aortic Atherosclerosis (ICD10-I70.0). Electronically Signed   By: Carey Bullocks M.D.   On: 12/05/2020 10:00    ____________________________________________   PROCEDURES  Procedure(s) performed (including Critical Care):  Procedures  ____________________________________________   INITIAL IMPRESSION / ASSESSMENT AND PLAN     80 year old male presents after a fall. Daughter at bedside states that he is constantly up and down all night. He is supposed to use a walker, but often chooses not to. Patient's only complaint at this time is back pain. Although difficult to understand, he is able to answer questions appropriately and follow commands. Plan will be to get imaging of his head, neck, thoracic and lumbar spine and get screening labs and ekg. DIFFERENTIAL DIAGNOSIS  ICH, Compression fracture  ED COURSE  CTs and x-ray imaging all reassuring as are the labs, urinalysis, and EKG. Results discussed with family members. Encouraged to give 650mg  of tylenol every 8 hours if needed for pain and follow up with PCP for concerns.    ___________________________________________   FINAL CLINICAL IMPRESSION(S) / ED DIAGNOSES  Final diagnoses:  Fall, initial encounter  Acute midline low back pain without sciatica     ED Discharge Orders     None        MASAKI ROTHBAUER was evaluated in Emergency Department on 12/05/2020 for the symptoms described in the history of present illness. He was evaluated in the context of the global COVID-19 pandemic, which necessitated consideration that the patient might be at risk for infection with the SARS-CoV-2 virus that causes COVID-19. Institutional protocols and algorithms that pertain to the evaluation of  patients at risk for COVID-19 are in a state of rapid change based on information released by regulatory bodies including the CDC and federal and state organizations. These policies and algorithms were followed during the patient's care in the ED.   Note:  This document was prepared using Dragon voice recognition software and may include unintentional dictation errors.    02/04/2021, FNP 12/05/20 1034    02/04/21, MD 12/05/20 1615

## 2020-12-05 NOTE — Discharge Instructions (Signed)
Give tylenol 65omg every 8 hours if needed for pain.  Have him follow up with primary care if pain does not seem to improve with time and Tylenol.  Return to the ER for symptoms of concern if unable to see primary care.

## 2020-12-05 NOTE — ED Triage Notes (Signed)
Pt arrived via ems from home. Pt here for unwitnessed fall, pt c/o back pain. Hx of dementia and currently at baseline, mainly nonverbal per ems does not take thinners. Pt lives with wife. Hospice pt. NAD noted at this time  81 cbg 158/88 bp

## 2020-12-05 NOTE — ED Provider Notes (Signed)
ED ECG REPORT I, Dionne Bucy, the attending physician, personally viewed and interpreted this ECG.  Date: 12/05/2020 EKG Time: 08 40 Rate: 60 Rhythm: normal sinus rhythm QRS Axis: normal Intervals: normal ST/T Wave abnormalities: LVH with repolarization abnormality Narrative Interpretation: no evidence of acute ischemia; no significant change when compared to EKG of 05/15/2020    Dionne Bucy, MD 12/05/20 [phone]

## 2020-12-05 NOTE — ED Notes (Signed)
Pt able to communicate he is having discomfort in lower and middle back. Pt able to verbally respond, but difficult to understand at times

## 2020-12-24 DIAGNOSIS — R69 Illness, unspecified: Secondary | ICD-10-CM | POA: Diagnosis not present

## 2020-12-24 DIAGNOSIS — F0153 Vascular dementia, unspecified severity, with mood disturbance: Secondary | ICD-10-CM

## 2021-03-29 ENCOUNTER — Ambulatory Visit: Payer: Medicare HMO | Admitting: Family Medicine

## 2021-04-02 ENCOUNTER — Ambulatory Visit: Payer: Medicare HMO | Admitting: Family Medicine

## 2021-04-15 ENCOUNTER — Ambulatory Visit: Payer: Medicare HMO | Admitting: Family Medicine

## 2021-04-30 ENCOUNTER — Ambulatory Visit: Payer: Medicare HMO | Admitting: Family Medicine

## 2021-06-12 ENCOUNTER — Telehealth: Payer: Self-pay

## 2021-07-04 ENCOUNTER — Telehealth: Payer: Self-pay | Admitting: Family Medicine

## 2021-07-04 NOTE — Telephone Encounter (Signed)
Copied from CRM [id number]. Topic: Medicare AWV ?>> Jul 04, 2021  2:59 PM Claudette Laws R wrote: ?Reason for CRM:  ?Left message for patient to call back and schedule Medicare Annual Wellness Visit (AWV) in office.  ? ?If unable to come into the office for AWV,  please offer to do virtually or by telephone. ? ?Last AWV: 06/07/2020 ? ?Please schedule at anytime with [facility] Advisor. ? ?30 minute appointment for Virtual or phone ?45 minute appointment for in office or Initial virtual/phone ? ?Any questions, please contact me at [phone] ?

## 2021-08-06 ENCOUNTER — Ambulatory Visit (INDEPENDENT_AMBULATORY_CARE_PROVIDER_SITE_OTHER)

## 2021-08-06 DIAGNOSIS — Z0000 Encounter for general adult medical examination without abnormal findings: Secondary | ICD-10-CM | POA: Diagnosis not present

## 2021-08-06 NOTE — Patient Instructions (Signed)
Mr. Sean Phelps , Thank you for taking time to come for your Medicare Wellness Visit. I appreciate your ongoing commitment to your health goals. Please review the following plan we discussed and let me know if I can assist you in the future.   Screening recommendations/referrals: Colonoscopy: no longer required Recommended yearly ophthalmology/optometry visit for glaucoma screening and checkup Recommended yearly dental visit for hygiene and checkup  Vaccinations: Influenza vaccine: due fall 2023 Pneumococcal vaccine: done 07/13/15 Tdap vaccine: done 07/13/15 Shingles vaccine: Shingrix discussed. Please contact your pharmacy for coverage information.  Covid-19: done 04/23/19 & 05/21/19  Advanced directives: Please bring a copy of your health care power of attorney and living will to the office at your convenience.   Conditions/risks identified: recommend continuing fall prevention at home  Next appointment: Follow up in one year for your annual wellness visit.   Preventive Care 49 Years and Older, Male Preventive care refers to lifestyle choices and visits with your health care provider that can promote health and wellness. What does preventive care include? A yearly physical exam. This is also called an annual well check. Dental exams once or twice a year. Routine eye exams. Ask your health care provider how often you should have your eyes checked. Personal lifestyle choices, including: Daily care of your teeth and gums. Regular physical activity. Eating a healthy diet. Avoiding tobacco and drug use. Limiting alcohol use. Practicing safe sex. Taking low doses of aspirin every day. Taking vitamin and mineral supplements as recommended by your health care provider. What happens during an annual well check? The services and screenings done by your health care provider during your annual well check will depend on your age, overall health, lifestyle risk factors, and family history of  disease. Counseling  Your health care provider may ask you questions about your: Alcohol use. Tobacco use. Drug use. Emotional well-being. Home and relationship well-being. Sexual activity. Eating habits. History of falls. Memory and ability to understand (cognition). Work and work Astronomer. Screening  You may have the following tests or measurements: Height, weight, and BMI. Blood pressure. Lipid and cholesterol levels. These may be checked every 5 years, or more frequently if you are over 2 years old. Skin check. Lung cancer screening. You may have this screening every year starting at age 69 if you have a 30-pack-year history of smoking and currently smoke or have quit within the past 15 years. Fecal occult blood test (FOBT) of the stool. You may have this test every year starting at age 83. Flexible sigmoidoscopy or colonoscopy. You may have a sigmoidoscopy every 5 years or a colonoscopy every 10 years starting at age 69. Prostate cancer screening. Recommendations will vary depending on your family history and other risks. Hepatitis C blood test. Hepatitis B blood test. Sexually transmitted disease (STD) testing. Diabetes screening. This is done by checking your blood sugar (glucose) after you have not eaten for a while (fasting). You may have this done every 1-3 years. Abdominal aortic aneurysm (AAA) screening. You may need this if you are a current or former smoker. Osteoporosis. You may be screened starting at age 13 if you are at high risk. Talk with your health care provider about your test results, treatment options, and if necessary, the need for more tests. Vaccines  Your health care provider may recommend certain vaccines, such as: Influenza vaccine. This is recommended every year. Tetanus, diphtheria, and acellular pertussis (Tdap, Td) vaccine. You may need a Td booster every 10 years. Zoster vaccine. You  may need this after age 75. Pneumococcal 13-valent  conjugate (PCV13) vaccine. One dose is recommended after age 41. Pneumococcal polysaccharide (PPSV23) vaccine. One dose is recommended after age 64. Talk to your health care provider about which screenings and vaccines you need and how often you need them. This information is not intended to replace advice given to you by your health care provider. Make sure you discuss any questions you have with your health care provider. Document Released: 03/09/2015 Document Revised: 10/31/2015 Document Reviewed: 12/12/2014 Elsevier Interactive Patient Education  2017 [place] Prevention in the Home Falls can cause injuries. They can happen to people of all ages. There are many things you can do to make your home safe and to help prevent falls. What can I do on the outside of my home? Regularly fix the edges of walkways and driveways and fix any cracks. Remove anything that might make you trip as you walk through a door, such as a raised step or threshold. Trim any bushes or trees on the path to your home. Use bright outdoor lighting. Clear any walking paths of anything that might make someone trip, such as rocks or tools. Regularly check to see if handrails are loose or broken. Make sure that both sides of any steps have handrails. Any raised decks and porches should have guardrails on the edges. Have any leaves, snow, or ice cleared regularly. Use sand or salt on walking paths during winter. Clean up any spills in your garage right away. This includes oil or grease spills. What can I do in the bathroom? Use night lights. Install grab bars by the toilet and in the tub and shower. Do not use towel bars as grab bars. Use non-skid mats or decals in the tub or shower. If you need to sit down in the shower, use a plastic, non-slip stool. Keep the floor dry. Clean up any water that spills on the floor as soon as it happens. Remove soap buildup in the tub or shower regularly. Attach bath mats  securely with double-sided non-slip rug tape. Do not have throw rugs and other things on the floor that can make you trip. What can I do in the bedroom? Use night lights. Make sure that you have a light by your bed that is easy to reach. Do not use any sheets or blankets that are too big for your bed. They should not hang down onto the floor. Have a firm chair that has side arms. You can use this for support while you get dressed. Do not have throw rugs and other things on the floor that can make you trip. What can I do in the kitchen? Clean up any spills right away. Avoid walking on wet floors. Keep items that you use a lot in easy-to-reach places. If you need to reach something above you, use a strong step stool that has a grab bar. Keep electrical cords out of the way. Do not use floor polish or wax that makes floors slippery. If you must use wax, use non-skid floor wax. Do not have throw rugs and other things on the floor that can make you trip. What can I do with my stairs? Do not leave any items on the stairs. Make sure that there are handrails on both sides of the stairs and use them. Fix handrails that are broken or loose. Make sure that handrails are as long as the stairways. Check any carpeting to make sure that it is firmly  attached to the stairs. Fix any carpet that is loose or worn. Avoid having throw rugs at the top or bottom of the stairs. If you do have throw rugs, attach them to the floor with carpet tape. Make sure that you have a light switch at the top of the stairs and the bottom of the stairs. If you do not have them, ask someone to add them for you. What else can I do to help prevent falls? Wear shoes that: Do not have high heels. Have rubber bottoms. Are comfortable and fit you well. Are closed at the toe. Do not wear sandals. If you use a stepladder: Make sure that it is fully opened. Do not climb a closed stepladder. Make sure that both sides of the stepladder  are locked into place. Ask someone to hold it for you, if possible. Clearly mark and make sure that you can see: Any grab bars or handrails. First and last steps. Where the edge of each step is. Use tools that help you move around (mobility aids) if they are needed. These include: Canes. Walkers. Scooters. Crutches. Turn on the lights when you go into a dark area. Replace any light bulbs as soon as they burn out. Set up your furniture so you have a clear path. Avoid moving your furniture around. If any of your floors are uneven, fix them. If there are any pets around you, be aware of where they are. Review your medicines with your doctor. Some medicines can make you feel dizzy. This can increase your chance of falling. Ask your doctor what other things that you can do to help prevent falls. This information is not intended to replace advice given to you by your health care provider. Make sure you discuss any questions you have with your health care provider. Document Released: 12/07/2008 Document Revised: 07/19/2015 Document Reviewed: 03/17/2014 Elsevier Interactive Patient Education  2017 [employer].

## 2021-08-06 NOTE — Progress Notes (Signed)
Subjective:   Sean Phelps is a 81 y.o. male who presents for Medicare Annual/Subsequent preventive examination.  Virtual Visit via Telephone Note  I connected with  Sean Phelps on 08/06/21 at  3:30 PM EDT by telephone and verified that I am speaking with the correct person using two identifiers.  Location: Patient: home Provider: Eden Phelps participating in the virtual visit: patient & wife Sean Phelps Health Advisor   I discussed the limitations, risks, security and privacy concerns of performing an evaluation and management service by telephone and the availability of in person appointments. The patient expressed understanding and agreed to proceed.  Interactive audio and video telecommunications were attempted between this nurse and patient, however failed, due to patient having technical difficulties OR patient did not have access to video capability.  We continued and completed visit with audio only.  Some vital signs may be absent or patient reported.   Sean Marker, LPN   Review of Systems     Cardiac Risk Factors include: advanced age (>54men, >47 women);dyslipidemia;male gender;hypertension     Objective:    There were no vitals filed for this visit. There is no height or weight on file to calculate BMI.     08/06/2021    3:48 PM 12/05/2020    7:37 AM 06/07/2020   10:56 AM 05/15/2020    8:00 PM 06/02/2019   10:16 AM 12/20/2016   12:08 AM 11/12/2016    4:05 PM  Advanced Directives  Does Patient Have a Medical Advance Directive? Yes Yes No No No No Yes  Type of Advance Directive Healthcare Power of [place] of facility DNR (pink MOST or yellow form);[place] in Chart? No - copy requested        Would patient like information on creating a medical advance directive?   No - Patient declined No - Patient declined No - Patient declined No - Patient declined     Current Medications  (verified) Outpatient Encounter Medications as of 08/06/2021  Medication Sig   LORazepam (ATIVAN) 0.5 MG tablet Take 0.5 mg by mouth every 4 (four) hours as needed.   polyethylene glycol (MIRALAX / GLYCOLAX) 17 g packet Take 1 packet by mouth daily as needed.   QUEtiapine (SEROQUEL) 50 MG tablet Take 50 mg by mouth 3 (three) times daily.   traZODone (DESYREL) 50 MG tablet Take 50 mg by mouth at bedtime.   HYDROcodone-acetaminophen (NORCO/VICODIN) 5-325 MG tablet Take 1 tablet by mouth every 4 (four) hours as needed. (Patient not taking: Reported on 08/06/2021)   Morphine Sulfate (MORPHINE CONCENTRATE) 10 mg / 0.5 ml concentrated solution SMARTSIG:0.25 Milliliter(s) By Mouth Every Hour PRN (Patient not taking: Reported on 08/06/2021)   [DISCONTINUED] carbidopa-levodopa (SINEMET IR) 25-100 MG tablet Take 1 tablet by mouth 2 (two) times daily. (Patient not taking: Reported on 10/17/2020)   [DISCONTINUED] Cyanocobalamin (VITAMIN B-12) 1000 MCG SUBL Place 1 each under the tongue daily.   [DISCONTINUED] QUEtiapine (SEROQUEL) 25 MG tablet Take 25 mg by mouth at bedtime.   No facility-administered encounter medications on file as of 08/06/2021.    Allergies (verified) Brinzolamide-brimonidine   History: Past Medical History:  Diagnosis Date   Asthma    Brachial neuritis    Brachial neuritis    Elevated cholesterol 07/20/14   Hypertension    Lumbago    Thoracic aortic atherosclerosis ([place]) 07/20/2016   Chest xray May 2018  Past Surgical History:  Procedure Laterality Date   CATARACT EXTRACTION Right    EYE SURGERY     Family History  Problem Relation Age of Onset   Leukemia Father    Emphysema Father    Alcohol abuse Mother    Social History   Socioeconomic History   Marital status: Married    Spouse name: [name]    Number of children: 4   Years of education: Not on file   Highest education level: Not on file  Occupational History   Not on file  Tobacco Use   Smoking status:  Former    Years: 25.00    Types: Cigarettes    Quit date: 12/25/1965    Years since quitting: 55.6   Smokeless tobacco: Never  Vaping Use   Vaping Use: Never used  Substance and Sexual Activity   Alcohol use: No    Alcohol/week: 0.0 standard drinks of alcohol   Drug use: No   Sexual activity: Never  Other Topics Concern   Not on file  Social History Narrative   Married   Unemployed since eye problems Summer 2017   Social Determinants of Health   Financial Resource Strain: Low Risk  (08/06/2021)   Overall Financial Resource Strain (CARDIA)    Difficulty of Paying Living Expenses: Not hard at all  Food Insecurity: No Food Insecurity (08/06/2021)   Hunger Vital Sign    Worried About Running Out of Food in the Last Year: Never true    [place] in the Last Year: Never true  Transportation Needs: No Transportation Needs (08/06/2021)   PRAPARE - Hydrologist (Medical): No    Lack of Transportation (Non-Medical): No  Physical Activity: Inactive (08/06/2021)   Exercise Vital Sign    Days of Exercise per Week: 0 days    Minutes of Exercise per Session: 0 min  Stress: No Stress Concern Present (08/06/2021)   [place]    Feeling of Stress : Only a little  Social Connections: Moderately Isolated (08/06/2021)   Social Connection and Isolation Panel [NHANES]    Frequency of Communication with Friends and Family: Twice a week    Frequency of Social Gatherings with Friends and Family: Once a week    Attends Religious Services: Never    Marine scientist or Organizations: No    Attends Music therapist: Never    Marital Status: Married    Tobacco Counseling Counseling given: Not Answered   Clinical Intake:  Pre-visit preparation completed: Yes  Pain : No/denies pain     Nutritional Risks: None Diabetes: No  How often do you need to have someone help you when  you read instructions, pamphlets, or other written materials from your doctor or pharmacy?: 5 - Always    Interpreter Needed?: No  Information entered by :: Sean Marker LPN   Activities of Daily Living    08/06/2021    3:49 PM 09/25/2020   11:04 AM  In your present state of health, do you have any difficulty performing the following activities:  Hearing? 0 1  Vision? 0 1  Difficulty concentrating or making decisions? 1 1  Walking or climbing stairs? 1 1  Dressing or bathing? 1 1  Doing errands, shopping? 1 1  Preparing Food and eating ? Y   Using the Toilet? Y   In the past six months, have you accidently leaked urine? Darreld Mclean  Do you have problems with loss of bowel control? Y   Managing your Medications? Y   Managing your Finances? Y   Housekeeping or managing your Housekeeping? Y     Patient Care Team: Steele Sizer, MD as PCP - General (Family Medicine)  Indicate any recent Medical Services you may have received from other than [facility] providers in the past year (date may be approximate).     Assessment:   This is a routine wellness examination for [place].  Hearing/Vision screen Hearing Screening - Comments:: Pt has hearing difficulty. Declines hearing aids. Vision Screening - Comments:: Past due for eye exam  Dietary issues and exercise activities discussed: Current Exercise Habits: The patient does not participate in regular exercise at present, Exercise limited by: orthopedic condition(s);neurologic condition(s)   Goals Addressed   None    Depression Screen    08/06/2021    3:47 PM 09/25/2020   11:04 AM 06/27/2020   12:24 PM 06/25/2020   10:41 AM 06/07/2020   10:55 AM 05/28/2020    9:59 AM 03/07/2020   10:16 AM  PHQ 2/9 Scores  PHQ - 2 Score 0 0 3 6 0 0 3  PHQ- 9 Score  0  12   6    Fall Risk    08/06/2021    3:48 PM 10/17/2020   11:04 AM 09/25/2020   11:04 AM 09/06/2020   12:05 PM 08/01/2020   11:20 AM  Fall Risk   Falls in the past year? 1 1 1 1 1   Number  falls in past yr: 1 1 1 1 1   Injury with Fall? 0 0 0 0 0  Risk for fall due to : Impaired balance/gait;Impaired mobility;History of fall(s) Impaired mobility;Mental status change  Impaired mobility;History of fall(s);Mental status change Impaired mobility;History of fall(s)  Risk for fall due to: Comment     Spouse reports two falls within the last month  Follow up Falls prevention discussed Falls prevention discussed  Falls prevention discussed Falls prevention discussed    FALL RISK PREVENTION PERTAINING TO THE HOME:  Any stairs in or around the home? Yes  If so, are there any without handrails? No  Home free of loose throw rugs in walkways, pet beds, electrical cords, etc? Yes  Adequate lighting in your home to reduce risk of falls? Yes   ASSISTIVE DEVICES UTILIZED TO PREVENT FALLS:  Life alert? No  Use of a cane, walker or w/c? Yes  Grab bars in the bathroom? No  Shower chair or bench in shower? No  Elevated toilet seat or a handicapped toilet? Yes   TIMED UP AND GO:  Was the test performed? No . Telephonic visit.   Cognitive Function: Cognitive status assessed by direct observation. Patient has current diagnosis of cognitive impairment. Patient is followed by neurology for ongoing assessment. Patient is unable to complete screening 6CIT or MMSE.      08/06/2016   10:50 AM  MMSE - Mini Mental State Exam  Orientation to time 0  Orientation to Place 4  Registration 3  Attention/ Calculation 0  Recall 3  Language- name 2 objects 2  Language- repeat 1  Language- follow 3 step command 3  Language- read & follow direction 1  Write a sentence 0  Copy design 0  Total score 17        Immunizations Immunization History  Administered Date(s) Administered   Fluad Quad(high Dose 65+) 12/01/2018, 11/15/2019   Influenza, High Dose Seasonal PF 12/26/2015, 01/28/2017, 11/30/2017  Influenza-Unspecified 11/01/2013   Moderna Sars-Covid-2 Vaccination 04/23/2019, 05/21/2019    Pneumococcal Conjugate-13 08/31/2013   Pneumococcal Polysaccharide-23 07/13/2015   Tdap 07/13/2015    TDAP status: Up to date  Flu Vaccine status: Declined, Education has been provided regarding the importance of this vaccine but patient still declined. Advised may receive this vaccine at local pharmacy or Health Dept. Aware to provide a copy of the vaccination record if obtained from local pharmacy or Health Dept. Verbalized acceptance and understanding.  Pneumococcal vaccine status: Up to date  Covid-19 vaccine status: Completed vaccines  Qualifies for Shingles Vaccine? Yes   Zostavax completed No   Shingrix Completed?: No.    Education has been provided regarding the importance of this vaccine. Patient has been advised to call insurance company to determine out of pocket expense if they have not yet received this vaccine. Advised may also receive vaccine at local pharmacy or Health Dept. Verbalized acceptance and understanding.  Screening Tests Health Maintenance  Topic Date Due   Zoster Vaccines- Shingrix (1 of 2) Never done   COVID-19 Vaccine (3 - Moderna series) 07/16/2019   INFLUENZA VACCINE  09/24/2021   TETANUS/TDAP  07/12/2025   Pneumonia Vaccine 27+ Years old  Completed   HPV VACCINES  Aged Out    Health Maintenance  Health Maintenance Due  Topic Date Due   Zoster Vaccines- Shingrix (1 of 2) Never done   COVID-19 Vaccine (3 - Moderna series) 07/16/2019    Colorectal cancer screening: No longer required.   Lung Cancer Screening: (Low Dose CT Chest recommended if Age 1-80 years, 30 pack-year currently smoking OR have quit w/in 15years.) does not qualify.   Additional Screening:  Hepatitis C Screening: does not qualify  Vision Screening: Recommended annual ophthalmology exams for early detection of glaucoma and other disorders of the eye. Is the patient up to date with their annual eye exam?  No  Who is the provider or what is the name of the office in which the  patient attends annual eye exams? Not established If pt is not established with a provider, would they like to be referred to a provider to establish care? No .   Dental Screening: Recommended annual dental exams for proper oral hygiene  Community Resource Referral / Chronic Care Management: CRR required this visit?  No   CCM required this visit?  No      Plan:     I have personally reviewed and noted the following in the patient's chart:   Medical and social history Use of alcohol, tobacco or illicit drugs  Current medications and supplements including opioid prescriptions. Patient is currently taking opioid prescriptions. Information provided to patient regarding non-opioid alternatives. Patient advised to discuss non-opioid treatment plan with their provider. Functional ability and status Nutritional status Physical activity Advanced directives List of other physicians Hospitalizations, surgeries, and ER visits in previous 12 months Vitals Screenings to include cognitive, depression, and falls Referrals and appointments  In addition, I have reviewed and discussed with patient certain preventive protocols, quality metrics, and best practice recommendations. A written personalized care plan for preventive services as well as general preventive health recommendations were provided to patient.     Sean Marker, LPN   [phone]   Nurse Notes: none

## 2021-10-18 ENCOUNTER — Ambulatory Visit: Payer: Self-pay

## 2021-10-18 NOTE — Chronic Care Management (AMB) (Signed)
   10/18/2021  Lucero Ide Wehrenberg 1940-11-17 [id number]  Documentation encounter created to complete case transition. The care team will follow for care coordination as needed.   [facility] Care Management [phone]

## 2022-01-10 IMAGING — CR DG LUMBAR SPINE 2-3V
1 series · 3 of 3 positions shown · non-contrast
Comparison: CT AP 05/16/2020

CLINICAL DATA: Status post fall.  Pain.

EXAM:
LUMBAR SPINE - 2-3 VIEW

[Series 1: dg lumbar spine 2-3 views · 0.14mm/px · 3 of 3 slices shown]
[im 1/3]
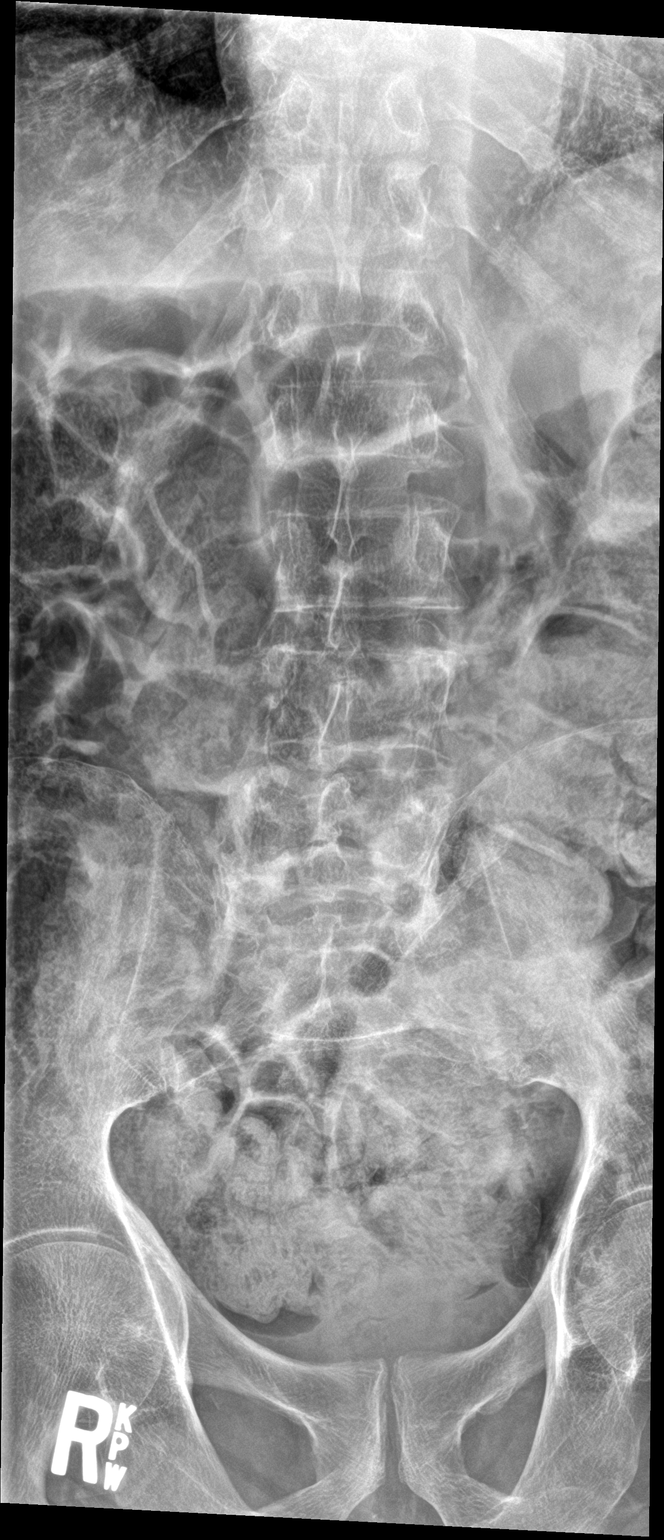
[im 2/3]
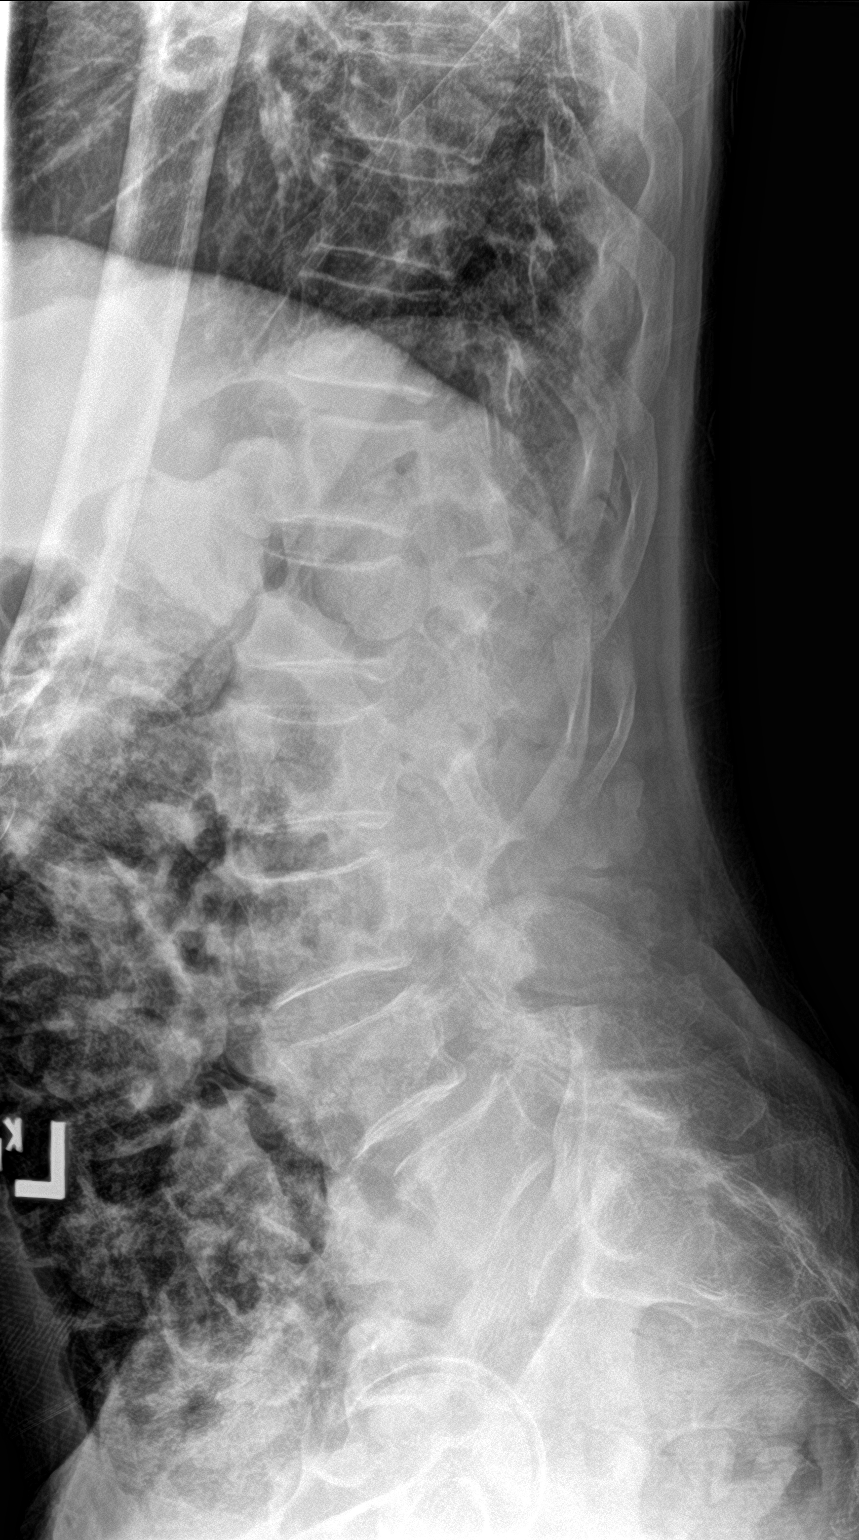
[im 3/3]
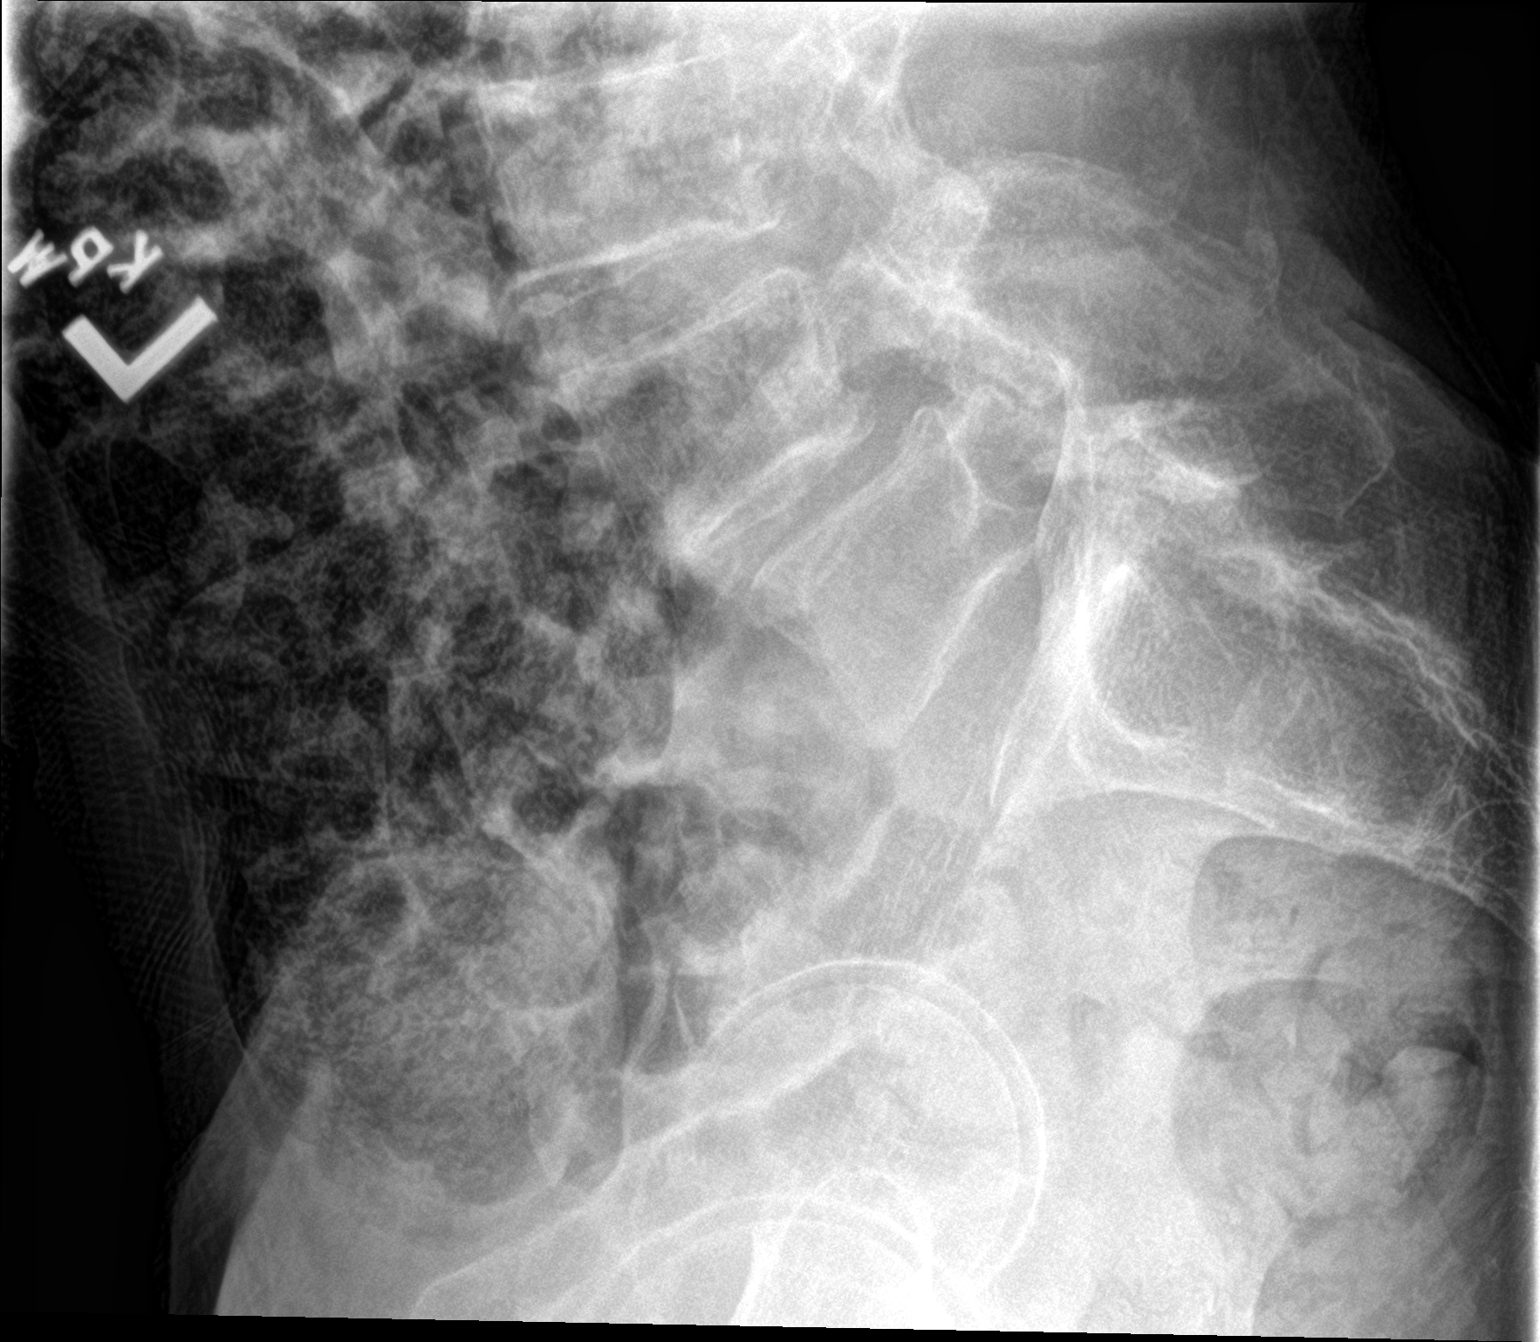

[3 of 3 positions shown; findings below may reference images not displayed]

FINDINGS: There is a large stool burden noted within the colon. Stool and
bowel gas overlie the lumbar spine diminishing exam detail. Mild
curvature of the lumbar spine is convex towards the left. There is
an anterolisthesis L4 on L5 measuring 8 mm. No fracture identified.
The disc spaces are relatively well preserved.
IMPRESSION: 1. Acute abnormality.
2. Mild scoliosis and grade 1 anterolisthesis of L4 on L5.

## 2022-01-10 IMAGING — CT CT HEAD W/O CM
3 series · 15 of 45 positions shown, 18 images · non-contrast
Comparison: Brain MRI 11/15/2013, CT head 07/20/2016

CLINICAL DATA: Un witnessed fall

EXAM:
CT HEAD WITHOUT CONTRAST
TECHNIQUE: Contiguous axial images were obtained from the base of the skull
through the vertex without intravenous contrast.

[Series 3: head wo · axial · 0.45mm/px · z∈[-139,-24]mm · 9 of 28 slices shown, 12 images]
[im 3/28  brain]
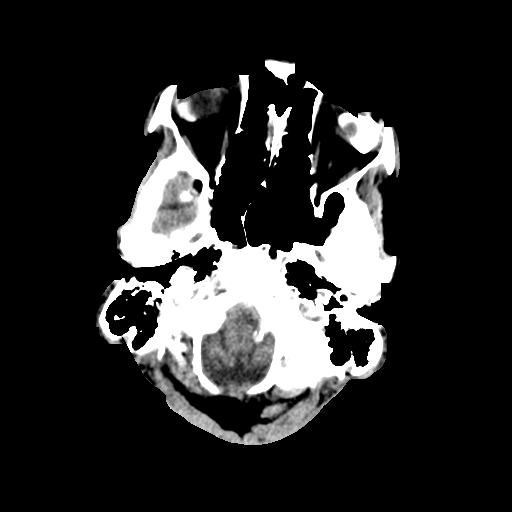
[im 3/28  bone]
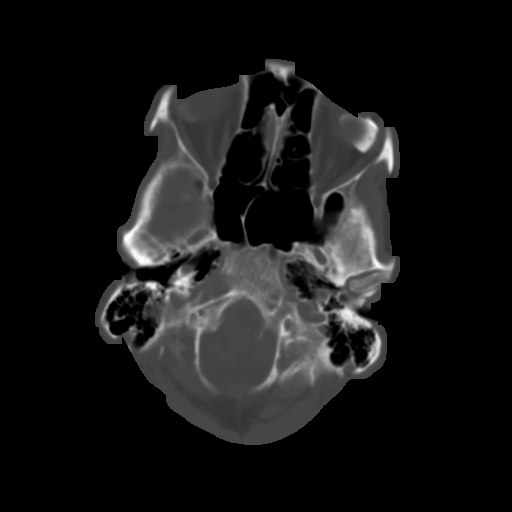
[im 6/28  brain]
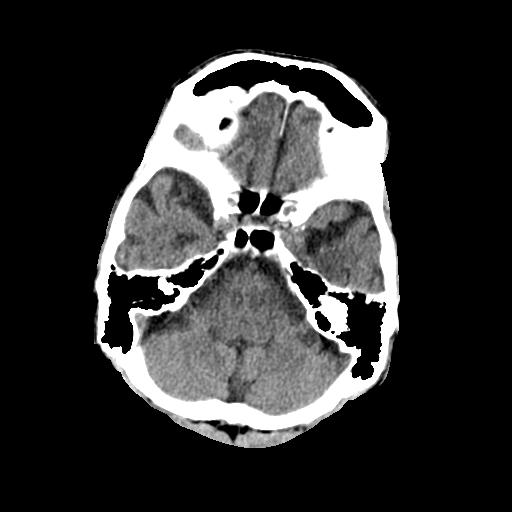
[im 9/28  brain]
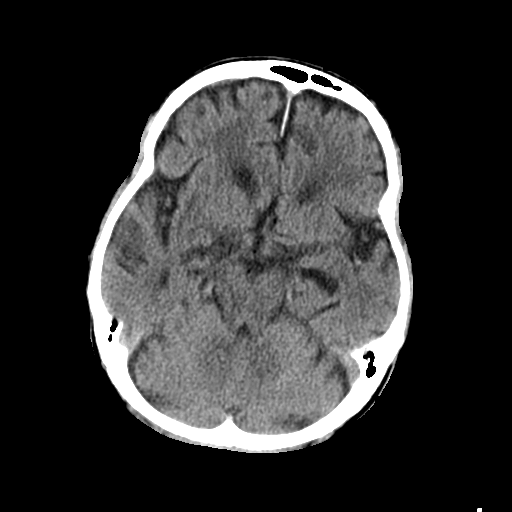
[im 12/28  brain]
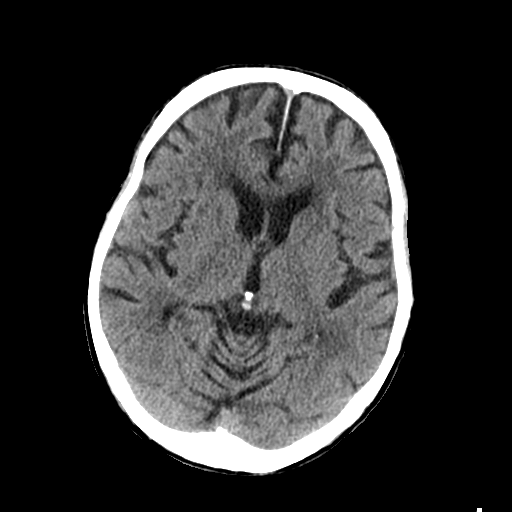
[im 15/28  brain]
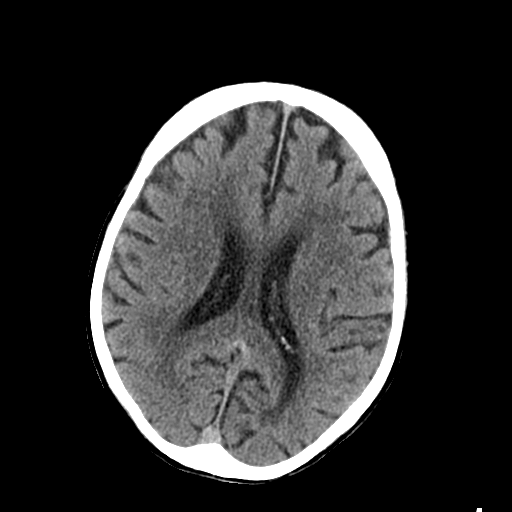
[im 15/28  bone]
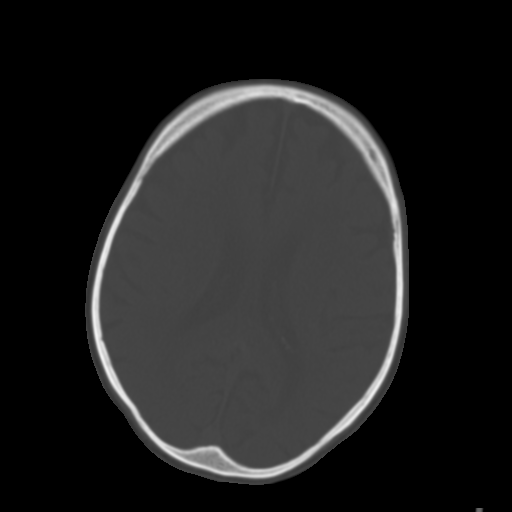
[im 17/28  brain]
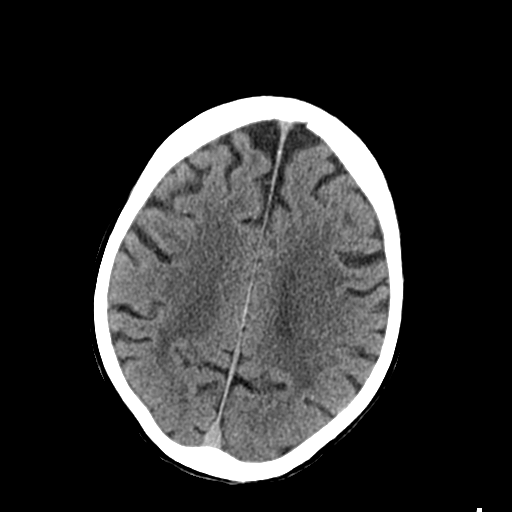
[im 20/28  brain]
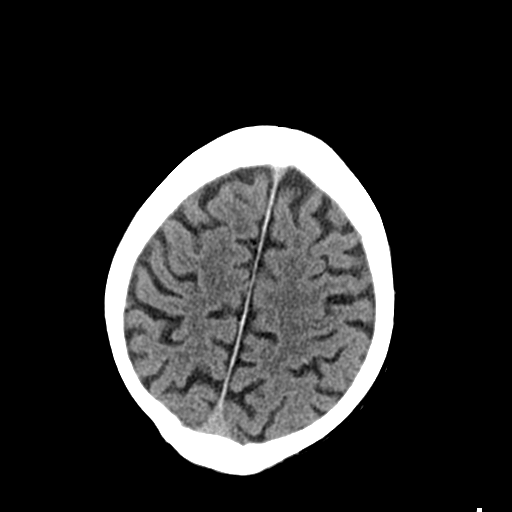
[im 23/28  brain]
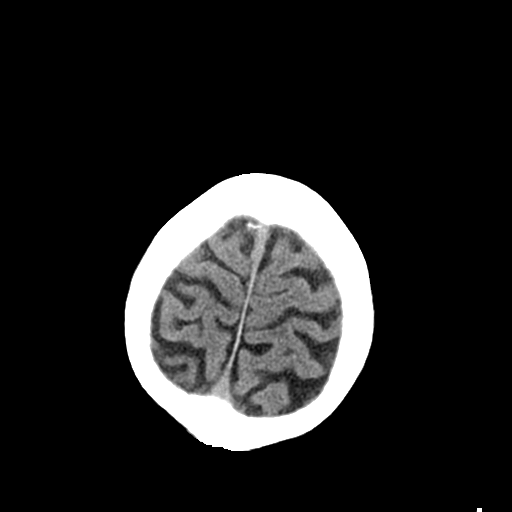
[im 26/28  brain]
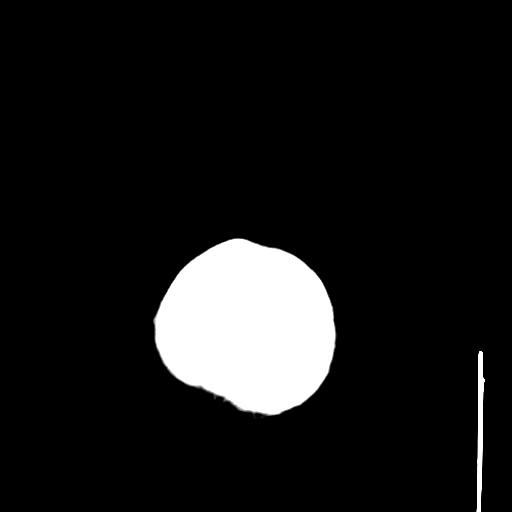
[im 26/28  bone]
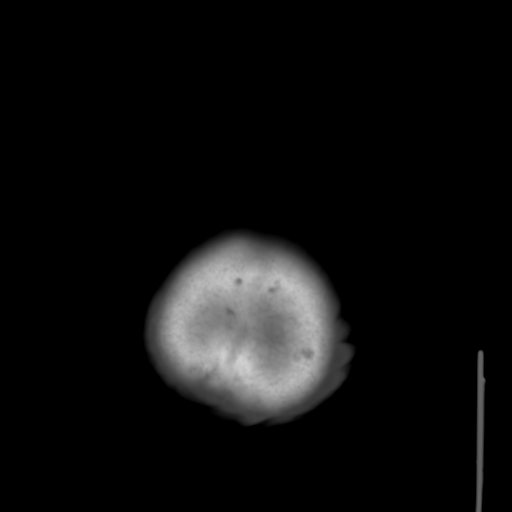

[Series 4: coronal soft tissue · coronal · 0.31mm/px · 3 of 67 slices shown]
[im 23/67  brain]
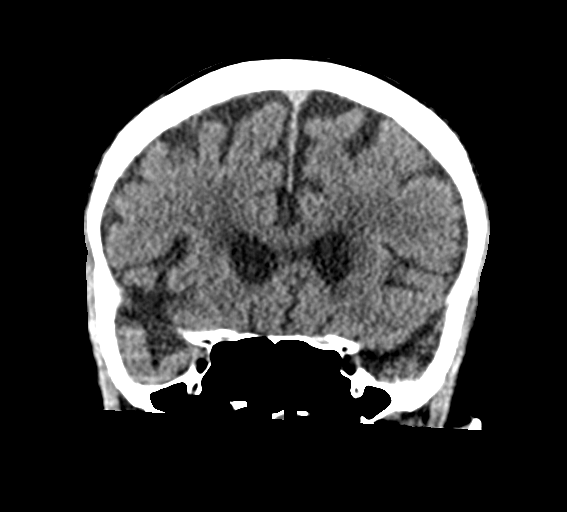
[im 30/67  brain]
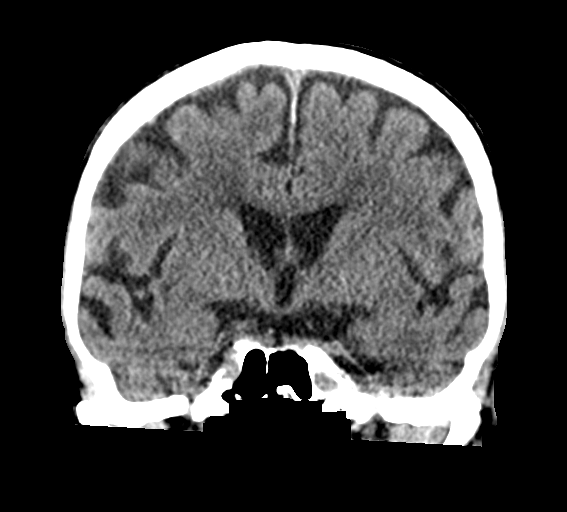
[im 37/67  brain]
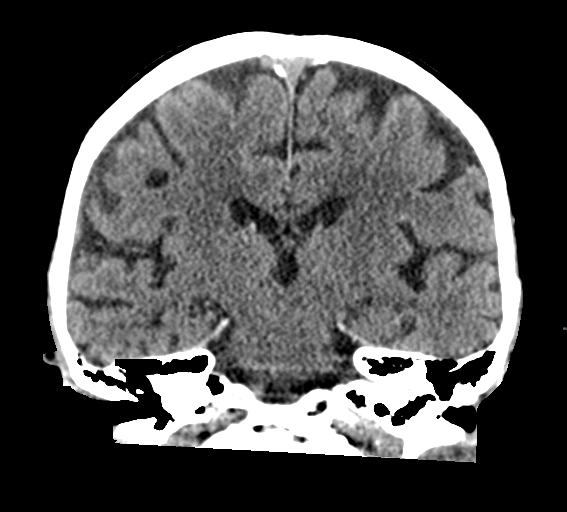

[Series 5: sagittal soft tissue · sagittal · 0.31mm/px · 3 of 53 slices shown]
[im 18/53  brain]
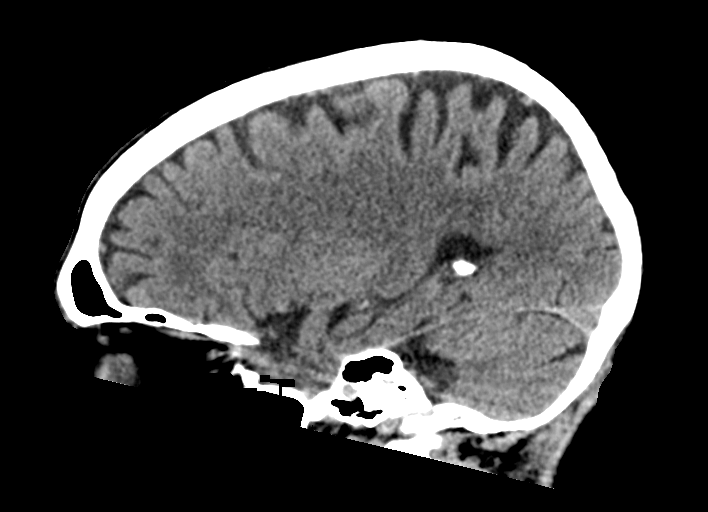
[im 27/53  brain]
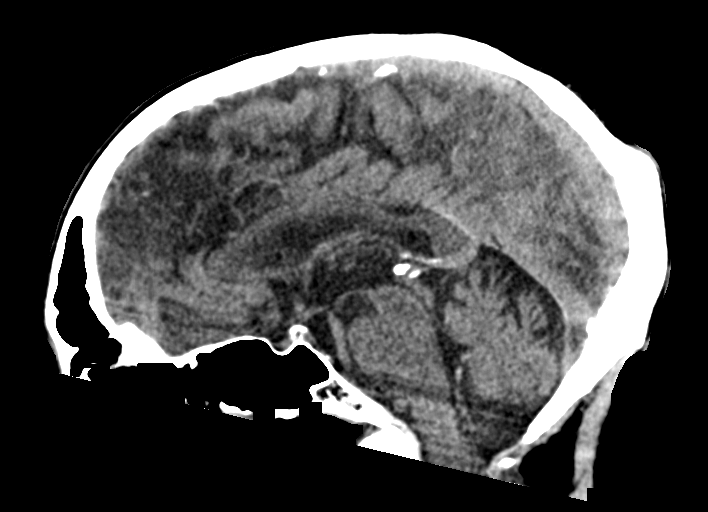
[im 35/53  brain]
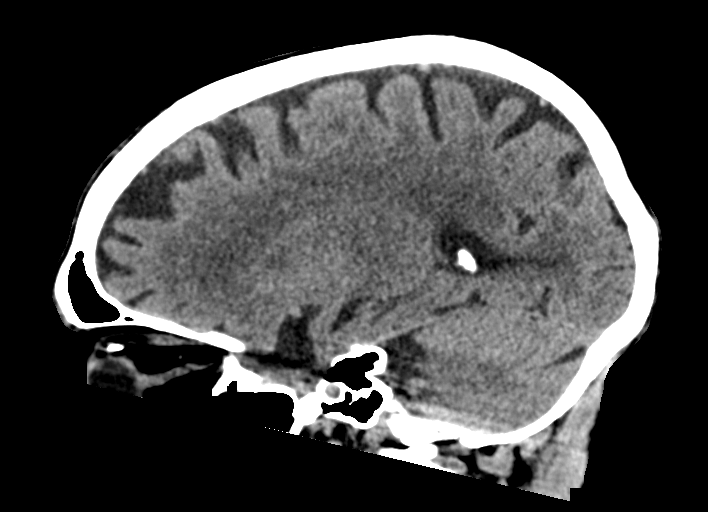

[15 of 45 positions shown; findings below may reference images not displayed]

FINDINGS: Brain: There is no acute intracranial hemorrhage, extra-axial fluid
collection, or acute infarct.

There is mild parenchymal volume loss. The ventricles are normal in
size. Foci of hypodensity in the subcortical and periventricular
white matter likely reflects sequela of mild chronic white matter
microangiopathy.

There is no mass lesion.  There is no midline shift.

Vascular: There is calcification of the bilateral cavernous ICAs.

Skull: Normal. Negative for fracture or focal lesion.

Sinuses/Orbits: The imaged paranasal sinuses are clear. Postsurgical
changes of the globes likely reflect glaucoma valves. Bilateral lens
implants are in place.

Other: None.
IMPRESSION: No acute intracranial hemorrhage or calvarial fracture.

## 2022-06-23 ENCOUNTER — Telehealth: Payer: Self-pay | Admitting: Family Medicine

## 2022-06-23 NOTE — Telephone Encounter (Signed)
Contacted Sean Phelps to schedule their annual wellness visit. Patient declined to schedule AWV at this time. Patient is being cared for by hospice.  [facility] Care Guide [facility] AWV TEAM Direct Dial: [phone]

## 2022-06-26 ENCOUNTER — Emergency Department

## 2022-06-26 ENCOUNTER — Emergency Department
Admission: EM | Admit: 2022-06-26 | Discharge: 2022-06-27 | Disposition: A | Discharge status: Home / Self Care | Attending: Emergency Medicine | Admitting: Emergency Medicine

## 2022-06-26 DIAGNOSIS — E43 Unspecified severe protein-calorie malnutrition: Secondary | ICD-10-CM | POA: Insufficient documentation

## 2022-06-26 DIAGNOSIS — T17900A Unspecified foreign body in respiratory tract, part unspecified causing asphyxiation, initial encounter: Secondary | ICD-10-CM | POA: Diagnosis not present

## 2022-06-26 DIAGNOSIS — R0989 Other specified symptoms and signs involving the circulatory and respiratory systems: Secondary | ICD-10-CM | POA: Diagnosis not present

## 2022-06-26 DIAGNOSIS — J690 Pneumonitis due to inhalation of food and vomit: Secondary | ICD-10-CM | POA: Diagnosis not present

## 2022-06-26 DIAGNOSIS — F0390 Unspecified dementia without behavioral disturbance: Secondary | ICD-10-CM | POA: Insufficient documentation

## 2022-06-26 DIAGNOSIS — T17908A Unspecified foreign body in respiratory tract, part unspecified causing other injury, initial encounter: Secondary | ICD-10-CM

## 2022-06-26 DIAGNOSIS — Z0389 Encounter for observation for other suspected diseases and conditions ruled out: Secondary | ICD-10-CM | POA: Diagnosis not present

## 2022-06-26 DIAGNOSIS — R059 Cough, unspecified: Secondary | ICD-10-CM | POA: Insufficient documentation

## 2022-06-26 DIAGNOSIS — T17920A Food in respiratory tract, part unspecified causing asphyxiation, initial encounter: Secondary | ICD-10-CM | POA: Diagnosis not present

## 2022-06-26 DIAGNOSIS — Z743 Need for continuous supervision: Secondary | ICD-10-CM | POA: Diagnosis not present

## 2022-06-26 LAB — COMPREHENSIVE METABOLIC PANEL
ALT: 12 U/L (ref 0–44)
AST: 22 U/L (ref 15–41)
Albumin: 3.2 g/dL — ABNORMAL LOW (ref 3.5–5.0)
Alkaline Phosphatase: 46 U/L (ref 38–126)
Anion gap: 8 (ref 5–15)
BUN: 19 mg/dL (ref 8–23)
CO2: 26 mmol/L (ref 22–32)
Calcium: 9 mg/dL (ref 8.9–10.3)
Chloride: 106 mmol/L (ref 98–111)
Creatinine, Ser: 0.46 mg/dL — ABNORMAL LOW (ref 0.61–1.24)
GFR, Estimated: >60 mL/min (ref >60)
Glucose, Bld: 96 mg/dL (ref 70–99)
Potassium: 3.1 mmol/L — ABNORMAL LOW (ref 3.5–5.1)
Sodium: 140 mmol/L (ref 135–145)
Total Bilirubin: 0.8 mg/dL (ref 0.3–1.2)
Total Protein: 7.1 g/dL (ref 6.5–8.1)

## 2022-06-26 LAB — CBC WITH DIFFERENTIAL/PLATELET
Abs Immature Granulocytes: 0.01 10*3/uL (ref 0.00–0.07)
Basophils Absolute: 0 10*3/uL (ref 0.0–0.1)
Basophils Relative: 1 %
Eosinophils Absolute: 0.2 10*3/uL (ref 0.0–0.5)
Eosinophils Relative: 5 %
HCT: 34.7 % — ABNORMAL LOW (ref 39.0–52.0)
Hemoglobin: 11.1 g/dL — ABNORMAL LOW (ref 13.0–17.0)
Immature Granulocytes: 0 %
Lymphocytes Relative: 36 %
Lymphs Abs: 1.2 10*3/uL (ref 0.7–4.0)
MCH: 29.2 pg (ref 26.0–34.0)
MCHC: 32 g/dL (ref 30.0–36.0)
MCV: 91.3 fL (ref 80.0–100.0)
Monocytes Absolute: 0.3 10*3/uL (ref 0.1–1.0)
Monocytes Relative: 9 %
Neutro Abs: 1.6 10*3/uL — ABNORMAL LOW (ref 1.7–7.7)
Neutrophils Relative %: 49 %
Platelets: 194 10*3/uL (ref 150–400)
RBC: 3.8 MIL/uL — ABNORMAL LOW (ref 4.22–5.81)
RDW: 12.8 % (ref 11.5–15.5)
WBC: 3.3 10*3/uL — ABNORMAL LOW (ref 4.0–10.5)
nRBC: 0 % (ref 0.0–0.2)

## 2022-06-26 NOTE — ED Notes (Signed)
Called [facility] for transport back home 

## 2022-06-26 NOTE — ED Triage Notes (Signed)
Pt presents via [facility] from home following the aspiration of rice and gravy while eating dinner. Pts wife was feeding him when he choked on a bite. Per EMS, upon their arrival the patients airway is patent and is on RA. Hx of dementia. A&Ox4 at this time. Denies CP or SOB.

## 2022-06-26 NOTE — ED Provider Notes (Signed)
[facility] Provider Note    Event Date/Time   First MD Initiated Contact with Patient 06/26/22 2232     (approximate)   History   Aspiration   HPI  Sean Phelps is a 82 y.o. male past medical history significant for dementia on hospice, who presents to the emergency department following a choking episode at home.  History is provided by the patient's wife and daughter who are at bedside.  Tonight patient had a choking episode whenever eating some rice and gravy.  They were not sure what to do so they brought him into the emergency department.  States that he is bedbound at baseline.  Plan to go to [facility] for hospice tomorrow.  Active DNR.  Denies any fever or chills.  Patient unable to provide any further history.  Sipping some water with some coughing.     Physical Exam   Triage Vital Signs: ED Triage Vitals  Enc Vitals Group     BP 06/26/22 2121 121/80     Pulse Rate 06/26/22 2121 73     Resp 06/26/22 2121 18     Temp 06/26/22 2121 98 F (36.7 C)     Temp Source 06/26/22 2121 Oral     SpO2 06/26/22 2116 96 %     Weight 06/26/22 2116 101 lb 13.6 oz (46.2 kg)     Height 06/26/22 2116 5\' 1"  (1.549 m)     Head Circumference --      Peak Flow --      Pain Score --      Pain Loc --      Pain Edu? --      Excl. in GC? --     Most recent vital signs: Vitals:   06/26/22 2116 06/26/22 2121  BP:  121/80  Pulse:  73  Resp:  18  Temp:  98 F (36.7 C)  SpO2: 96% 100%    Physical Exam Constitutional:      Appearance: He is well-developed.     Comments: Thin male with temporal wasting and thin legs.  Appears malnourished.  HENT:     Head: Atraumatic.  Eyes:     Conjunctiva/sclera: Conjunctivae normal.  Cardiovascular:     Rate and Rhythm: Regular rhythm.  Pulmonary:     Effort: No respiratory distress.     Breath sounds: No wheezing, rhonchi or rales.  Musculoskeletal:     Cervical back: Normal range of motion.  Skin:     General: Skin is warm.  Neurological:     Mental Status: He is alert. Mental status is at baseline.     IMPRESSION / MDM / ASSESSMENT AND PLAN / ED COURSE  I reviewed the triage vital signs and the nursing notes.  Differential diagnosis including pneumonia, electrolyte abnormality, CVA.  Patient has a history of severe dementia and is currently on hospice.  Plan for hospice home tomorrow.   RADIOLOGY I independently reviewed imaging, my interpretation of imaging: Chest x-ray with no focal findings consistent with pneumonia.  LABS (all labs ordered are listed, but only abnormal results are displayed) Labs interpreted as -    Labs Reviewed  CBC WITH DIFFERENTIAL/PLATELET - Abnormal; Notable for the following components:      Result Value   WBC 3.3 (*)    RBC 3.80 (*)    Hemoglobin 11.1 (*)    HCT 34.7 (*)    Neutro Abs 1.6 (*)    All other components within normal  limits  COMPREHENSIVE METABOLIC PANEL - Abnormal; Notable for the following components:   Potassium 3.1 (*)    Creatinine, Ser 0.46 (*)    Albumin 3.2 (*)    All other components within normal limits     MDM    Mild hypokalemia.  No other significant electrolyte abnormalities.  Patient without findings of pneumonia on chest x-ray.  Low suspicion for new CVA.  Patient would not want any significant life saving treatment.  Able to tolerate p.o. in the emergency department but did have some coughing.  Do not feel that the patient needs antibiotics at this time.  Current plan is to go to [facility]  Patient will go back to his home via ambulance with his daughter and wife.  Given return precautions for any signs of pneumonia, shortness of breath or fever.   PROCEDURES:  Critical Care performed: No  Procedures  Patient's presentation is most consistent with acute presentation with potential threat to life or bodily function.   MEDICATIONS ORDERED IN ED: Medications - No data to display  FINAL  CLINICAL IMPRESSION(S) / ED DIAGNOSES   Final diagnoses:  Aspiration into airway, initial encounter     Rx / DC Orders   ED Discharge Orders     None        Note:  This document was prepared using Dragon voice recognition software and may include unintentional dictation errors.   Corena Herter, MD 06/26/22 2333

## 2022-06-26 NOTE — Discharge Instructions (Signed)
Sean Phelps was seen in the emergency department after an episode of choking.  His chest x-ray did not show any signs of a pneumonia.  Do not feel that he needs antibiotics at this time.  If he were to develop a fever or shortness of breath it is important that he be evaluated by primary care physician to evaluate for possible need of antibiotics at that time.  Continue to follow with hospice.

## 2022-06-27 DIAGNOSIS — R531 Weakness: Secondary | ICD-10-CM | POA: Diagnosis not present

## 2022-06-27 DIAGNOSIS — Z743 Need for continuous supervision: Secondary | ICD-10-CM | POA: Diagnosis not present

## 2022-06-27 DIAGNOSIS — R404 Transient alteration of awareness: Secondary | ICD-10-CM | POA: Diagnosis not present

## 2022-06-27 DIAGNOSIS — T17920A Food in respiratory tract, part unspecified causing asphyxiation, initial encounter: Secondary | ICD-10-CM | POA: Diagnosis not present

## 2022-06-27 DIAGNOSIS — Z7401 Bed confinement status: Secondary | ICD-10-CM | POA: Diagnosis not present

## 2022-06-27 DIAGNOSIS — I959 Hypotension, unspecified: Secondary | ICD-10-CM | POA: Diagnosis not present

## 2022-06-30 DIAGNOSIS — G301 Alzheimer's disease with late onset: Secondary | ICD-10-CM | POA: Diagnosis not present

## 2022-06-30 DIAGNOSIS — H541142 Blindness right eye category 4, low vision left eye category 2: Secondary | ICD-10-CM | POA: Diagnosis not present

## 2022-06-30 DIAGNOSIS — F0280 Dementia in other diseases classified elsewhere without behavioral disturbance: Secondary | ICD-10-CM | POA: Diagnosis not present

## 2022-06-30 DIAGNOSIS — S51012A Laceration without foreign body of left elbow, initial encounter: Secondary | ICD-10-CM | POA: Diagnosis not present

## 2022-06-30 DIAGNOSIS — H409 Unspecified glaucoma: Secondary | ICD-10-CM | POA: Diagnosis not present

## 2022-06-30 DIAGNOSIS — I119 Hypertensive heart disease without heart failure: Secondary | ICD-10-CM | POA: Diagnosis not present

## 2022-06-30 DIAGNOSIS — L89611 Pressure ulcer of right heel, stage 1: Secondary | ICD-10-CM | POA: Diagnosis not present

## 2022-06-30 DIAGNOSIS — I679 Cerebrovascular disease, unspecified: Secondary | ICD-10-CM | POA: Diagnosis not present

## 2022-07-01 DIAGNOSIS — G301 Alzheimer's disease with late onset: Secondary | ICD-10-CM | POA: Diagnosis not present

## 2022-07-01 DIAGNOSIS — I119 Hypertensive heart disease without heart failure: Secondary | ICD-10-CM | POA: Diagnosis not present

## 2022-07-01 DIAGNOSIS — I679 Cerebrovascular disease, unspecified: Secondary | ICD-10-CM | POA: Diagnosis not present

## 2022-07-01 DIAGNOSIS — H541142 Blindness right eye category 4, low vision left eye category 2: Secondary | ICD-10-CM | POA: Diagnosis not present

## 2022-07-01 DIAGNOSIS — F0280 Dementia in other diseases classified elsewhere without behavioral disturbance: Secondary | ICD-10-CM | POA: Diagnosis not present

## 2022-07-02 DIAGNOSIS — R531 Weakness: Secondary | ICD-10-CM | POA: Diagnosis not present

## 2023-03-28 DEATH — deceased
